# Patient Record
Sex: Male | Born: 1945 | Race: White | Hispanic: No | Marital: Married | Smoking: Former smoker
Health system: Southern US, Community
[De-identification: ages and names within clinical notes are randomized; demographics above are authoritative.]

## PROBLEM LIST (undated history)

## (undated) DIAGNOSIS — K760 Fatty (change of) liver, not elsewhere classified: Secondary | ICD-10-CM

## (undated) DIAGNOSIS — C801 Malignant (primary) neoplasm, unspecified: Secondary | ICD-10-CM

## (undated) DIAGNOSIS — I10 Essential (primary) hypertension: Secondary | ICD-10-CM

## (undated) DIAGNOSIS — J439 Emphysema, unspecified: Secondary | ICD-10-CM

## (undated) DIAGNOSIS — E119 Type 2 diabetes mellitus without complications: Secondary | ICD-10-CM

## (undated) DIAGNOSIS — I82409 Acute embolism and thrombosis of unspecified deep veins of unspecified lower extremity: Secondary | ICD-10-CM

## (undated) DIAGNOSIS — N400 Enlarged prostate without lower urinary tract symptoms: Secondary | ICD-10-CM

## (undated) DIAGNOSIS — K219 Gastro-esophageal reflux disease without esophagitis: Secondary | ICD-10-CM

## (undated) DIAGNOSIS — M1990 Unspecified osteoarthritis, unspecified site: Secondary | ICD-10-CM

## (undated) DIAGNOSIS — N200 Calculus of kidney: Secondary | ICD-10-CM

## (undated) HISTORY — PX: LUMBAR LAMINECTOMY: SHX95

## (undated) HISTORY — DX: Benign prostatic hyperplasia without lower urinary tract symptoms: N40.0

## (undated) HISTORY — DX: Calculus of kidney: N20.0

## (undated) HISTORY — PX: TONSILLECTOMY AND ADENOIDECTOMY: SUR1326

---

## 2002-11-06 HISTORY — PX: URETEROSCOPY WITH HOLMIUM LASER LITHOTRIPSY: SHX6645

## 2006-11-06 HISTORY — PX: LASER OF PROSTATE W/ GREEN LIGHT PVP: SHX1953

## 2011-06-26 ENCOUNTER — Ambulatory Visit: Payer: Self-pay | Admitting: Unknown Physician Specialty

## 2011-06-29 LAB — PATHOLOGY REPORT

## 2014-07-01 ENCOUNTER — Ambulatory Visit: Payer: Self-pay | Admitting: Unknown Physician Specialty

## 2014-07-02 LAB — PATHOLOGY REPORT

## 2015-05-26 ENCOUNTER — Encounter: Payer: Self-pay | Admitting: Urology

## 2015-05-26 ENCOUNTER — Ambulatory Visit (INDEPENDENT_AMBULATORY_CARE_PROVIDER_SITE_OTHER): Payer: Medicare PPO | Admitting: Urology

## 2015-05-26 VITALS — BP 132/91 | HR 79 | Ht 73.0 in | Wt 224.6 lb

## 2015-05-26 DIAGNOSIS — R972 Elevated prostate specific antigen [PSA]: Secondary | ICD-10-CM

## 2015-05-26 NOTE — Progress Notes (Signed)
05/26/2015 10:47 AM   Ralph Marsh 12-27-1945 [id number]  Referring provider: No referring provider defined for this encounter.  Chief Complaint  Patient presents with  . Elevated PSA   Patient is here for consult in regards to have fast tolerating PSA change over 18 months. PSA went from 60.8-4.1 and a short period of time of 18 months. Since prostate feels normal and no infection or large enlargement of any degree I recommend a biopsy device. Patient has no family history of same. 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 If by history is positive then we can check it for aggressiveness utilizing DNA studies such as myriad genetics.   PMH: Past Medical History  Diagnosis Date  . Kidney stone     Surgical History: Past Surgical History  Procedure Laterality Date  . Tonsillectomy and adenoidectomy      Home Medications:    Medication List       This list is accurate as of: 05/26/15 10:47 AM.  Always use your most recent med list.               allopurinol 300 MG tablet  Commonly known as:  ZYLOPRIM  Take 300 mg by mouth daily.     aspirin 81 MG tablet  Take 81 mg by mouth daily.     Cholecalciferol 1000 UNITS tablet  Take 1,000 Units by mouth daily.     FISH OIL + D3 PO  Take by mouth.     lisinopril 10 MG tablet  Commonly known as:  PRINIVIL,ZESTRIL  Take 10 mg by mouth daily.     MORPHINE SULFATE (CONCENTRATE) PO  Take by mouth.     multivitamin tablet  Take 1 tablet by mouth daily.     pantoprazole 40 MG tablet  Commonly known as:  PROTONIX  Take 40 mg by mouth daily.     traZODone 100 MG tablet  Commonly known as:  DESYREL  Take 100 mg by mouth at bedtime.     venlafaxine XR 150 MG 24 hr capsule  Commonly known as:  EFFEXOR-XR  Take 150 mg by mouth daily with breakfast.        Allergies:  Allergies  Allergen Reactions  . Sulfa  Antibiotics Anaphylaxis    Family History: Family History  Problem Relation Age of Onset  . Kidney disease Neg Hx   . Kidney cancer Neg Hx   . Prostate cancer Neg Hx     Social History:  reports that he has quit smoking. He does not have any smokeless tobacco history on file. He reports that he drinks about 4.2 oz of alcohol per week. He reports that he does not use illicit drugs.  ROS: UROLOGY Frequent Urination?: Yes Hard to postpone urination?: Yes Burning/pain with urination?: No Get up at night to urinate?: Yes Leakage of urine?: No Urine stream starts and stops?: No Trouble starting stream?: Yes Do you have to strain to urinate?: Yes Blood in urine?: No Urinary tract infection?: No Sexually transmitted disease?: No Injury to kidneys or bladder?: No Painful intercourse?: No Weak stream?: No Erection problems?: Yes Penile pain?: No  Gastrointestinal Nausea?: No Vomiting?: No Indigestion/heartburn?: Yes Diarrhea?: No Constipation?: No  Constitutional Fever: No Night sweats?: No Weight loss?: No Fatigue?: No  Skin Skin rash/lesions?: No Itching?: No  Eyes Blurred vision?: No Double vision?: No  Ears/Nose/Throat Sore  throat?: No Sinus problems?: No  Hematologic/Lymphatic Swollen glands?: No Easy bruising?: No  Cardiovascular Leg swelling?: No Chest pain?: No  Respiratory Cough?: No Shortness of breath?: No  Endocrine Excessive thirst?: No  Musculoskeletal Back pain?: Yes Joint pain?: Yes  Neurological Headaches?: No Dizziness?: Yes  Psychologic Depression?: Yes Anxiety?: Yes  Physical Exam: BP 132/91 mmHg  Pulse 79  Ht _0  (1.854 m)  Wt 224 lb 9.6 oz (101.878 kg)  BMI 29.64 kg/m2  Constitutional:  Alert and oriented, No acute distress. HEENT: [place] AT, moist mucus membranes.  Trachea midline, no masses. Cardiovascular: No clubbing, cyanosis, or edema. Respiratory: Normal respiratory effort, no increased work of  breathing. GI: Abdomen is soft, nontender, nondistended, no abdominal masses GU: No CVA tenderness. Small firm prostate no nodules penis and testes normal no hernias rectal sphincter normal no rectal masses Skin: No rashes, bruises or suspicious lesions. Lymph: No cervical or inguinal adenopathy. Neurologic: Grossly intact, no focal deficits, moving all 4 extremities. Psychiatric: Normal mood and affect.  Laboratory Data: No results found for: WBC, HGB, HCT, MCV, PLT  No results found for: CREATININE  No results found for: PSA  No results found for: TESTOSTERONE  No results found for: HGBA1C  Urinalysis No results found for: COLORURINE, APPEARANCEUR, LABSPEC, PHURINE, GLUCOSEU, HGBUR, BILIRUBINUR, KETONESUR, PROTEINUR, UROBILINOGEN, NITRITE, LEUKOCYTESUR  Pertinent Imaging: None   Assessment & Plan: Patient had a velocity increase in his PSA from 0.8-4.1 over the last 18 months. His PSAs have been done at the [place] in the past. I advised him because of the small size of his prostate in the firmness of the prostate to undergo biopsy. I have scheduled him for a biopsy within the next month.   There are no diagnoses linked to this encounter.  No Follow-up on file.  Collier Flowers, [facility] [address], [place], [place] [address] [phone]

## 2015-06-03 ENCOUNTER — Ambulatory Visit: Payer: Self-pay

## 2015-06-14 ENCOUNTER — Ambulatory Visit: Payer: Self-pay

## 2015-06-21 ENCOUNTER — Other Ambulatory Visit: Payer: Self-pay

## 2015-06-22 ENCOUNTER — Telehealth: Payer: Self-pay | Admitting: Urology

## 2015-06-22 ENCOUNTER — Encounter: Payer: Self-pay | Admitting: Urology

## 2015-06-22 NOTE — Telephone Encounter (Signed)
Patient's wife called stating patient had his PSA redrawn on 05-31-15 after seeing Dr. Elnoria Howard on 05-26-15 and his PSA has gone down to 1.4( See labs from [place] under media) Patient would like to cancel his biopsy scheduled for next week since his PSA has gone down. I explained that I would send a message to you to make sure that it is ok to cancel. I see in your last note a mention of a firm prostate, patient's wife states patient had prostate surgery a PVP by Dr. Quillian Quince in 2008, this was not noted by the patient at his visit but I have updated this in his chart. Could the firmness be due to this? Or should he still proceed with a prostate biopsy?

## 2015-06-28 ENCOUNTER — Ambulatory Visit: Payer: Self-pay

## 2015-06-29 ENCOUNTER — Other Ambulatory Visit: Payer: Medicare PPO | Admitting: Urology

## 2015-06-29 NOTE — Telephone Encounter (Signed)
Spoke with Dr. Elnoria Howard and he stated that patient did not need to have the biopsy done at this time but needed a 62month follow up with DRE & PSA Please schedule and notify the patient of date and time Thanks

## 2015-07-06 ENCOUNTER — Ambulatory Visit: Payer: Medicare PPO

## 2015-07-19 NOTE — Telephone Encounter (Signed)
I called and lm with the appt date and time  12-23-14 @ 9:30 with Dr. Erlene Quan since Dr. Elnoria Howard will not be here  Thanks, Sharyn Lull

## 2015-08-11 ENCOUNTER — Ambulatory Visit (INDEPENDENT_AMBULATORY_CARE_PROVIDER_SITE_OTHER): Payer: Medicare PPO | Admitting: Urology

## 2015-08-11 VITALS — BP 132/89 | HR 96 | Ht 73.0 in | Wt 225.8 lb

## 2015-08-11 DIAGNOSIS — R972 Elevated prostate specific antigen [PSA]: Secondary | ICD-10-CM | POA: Diagnosis not present

## 2015-08-11 NOTE — Progress Notes (Signed)
08/11/2015 4:32 PM   Ralph Marsh [birth year] [id number]  Referring provider: Marguerita Merles, MD [name], [place] [address]  Chief Complaint  Patient presents with  . Elevated PSA    HPI: Patient comes in with results of a RNA urine test for the possibility of cancer. Report is seen below. Patient wants a second opinion with an MRI at [facility]. I have set this up for him today.   PMH: Past Medical History  Diagnosis Date  . Kidney stone   . BPH (benign prostatic hyperplasia)     Surgical History: Past Surgical History  Procedure Laterality Date  . Tonsillectomy and adenoidectomy    . Ureteroscopy with holmium laser lithotripsy  2004  . Laser of prostate w/ green light pvp  2008    Dr. Quillian Quince    Home Medications:    Medication List       This list is accurate as of: 08/11/15  4:32 PM.  Always use your most recent med list.               allopurinol 300 MG tablet  Commonly known as:  ZYLOPRIM  Take 300 mg by mouth daily.     aspirin 81 MG tablet  Take 81 mg by mouth daily.     Cholecalciferol 1000 UNITS tablet  Take 1,000 Units by mouth daily.     FISH OIL + D3 PO  Take by mouth.     lisinopril 10 MG tablet  Commonly known as:  PRINIVIL,ZESTRIL  Take 10 mg by mouth daily.     MORPHINE SULFATE (CONCENTRATE) PO  Take by mouth.     multivitamin tablet  Take 1 tablet by mouth daily.     pantoprazole 40 MG tablet  Commonly known as:  PROTONIX  Take 40 mg by mouth daily.     traZODone 100 MG tablet  Commonly known as:  DESYREL  Take 100 mg by mouth at bedtime.     venlafaxine XR 150 MG 24 hr capsule  Commonly known as:  EFFEXOR-XR  Take 150 mg by mouth daily with breakfast.        Allergies:  Allergies  Allergen Reactions  . Sulfa Antibiotics Anaphylaxis    Family History: Family History  Problem Relation Age of Onset  . Kidney disease Neg Hx   . Kidney cancer Neg Hx   . Prostate cancer Neg Hx     Social History:   reports that he has quit smoking. He does not have any smokeless tobacco history on file. He reports that he drinks about 4.2 oz of alcohol per week. He reports that he does not use illicit drugs.  ROS: UROLOGY Frequent Urination?: Yes Hard to postpone urination?: No Burning/pain with urination?: No Get up at night to urinate?: Yes Leakage of urine?: No Urine stream starts and stops?: No Trouble starting stream?: No Do you have to strain to urinate?: No Blood in urine?: No Urinary tract infection?: No Sexually transmitted disease?: No Injury to kidneys or bladder?: No Painful intercourse?: No Weak stream?: Yes Erection problems?: No Penile pain?: No  Gastrointestinal Nausea?: No Vomiting?: No Indigestion/heartburn?: No Diarrhea?: No Constipation?: No  Constitutional Fever: No Night sweats?: No Weight loss?: No Fatigue?: Yes  Skin Skin rash/lesions?: No Itching?: No  Eyes Blurred vision?: No Double vision?: No  Ears/Nose/Throat Sore throat?: No Sinus problems?: Yes  Hematologic/Lymphatic Swollen glands?: No Easy bruising?: No  Cardiovascular Leg swelling?: No Chest pain?: No  Respiratory Cough?:  Yes Shortness of breath?: No  Endocrine Excessive thirst?: Yes  Musculoskeletal Back pain?: Yes Joint pain?: No  Neurological Headaches?: No Dizziness?: No  Psychologic Depression?: No Anxiety?: No  Physical Exam: BP 132/89 mmHg  Pulse 96  Ht 6\' 1"  (1.854 m)  Wt 225 lb 12.8 oz (102.422 kg)  BMI 29.80 kg/m2  Constitutional:  Alert and oriented, No acute distress. HEENT: [place] AT, moist mucus membranes.  Trachea midline, no masses. Cardiovascular: No clubbing, cyanosis, or edema. Respiratory: Normal respiratory effort, no increased work of breathing. GI: Abdomen is soft, nontender, nondistended, no abdominal masses GU: No CVA tenderness. Normal penis and testes. Small prostate on rectal exam firm nonnodular slightly asymmetric with the left being a  little more prominent than the right. The rectal exam reveals no rectal cancers and good sphincter tone. Skin: No rashes, bruises or suspicious lesions. Lymph: No cervical or inguinal adenopathy. Neurologic: Grossly intact, no focal deficits, moving all 4 extremities. Psychiatric: Normal mood and affect.  Laboratory Data: No results found for: WBC, HGB, HCT, MCV, PLT  No results found for: CREATININE  No results found for: PSA  No results found for: TESTOSTERONE  No results found for: HGBA1C  Urinalysis No results found for: COLORURINE, APPEARANCEUR, LABSPEC, PHURINE, GLUCOSEU, HGBUR, BILIRUBINUR, KETONESUR, PROTEINUR, UROBILINOGEN, NITRITE, LEUKOCYTESUR  Pertinent Imaging: None  Assessment & Plan:  Patient presents with the information seen below and desire for a second opinion at [facility] with an MRI guided biopsy for his prostate. I have arranged for him to get a second opinion with [facility] oncology urology and discuss with them the parameters for MRI guided biopsy presence course. On rectal exam he has a very small prostate but had a vaporization of his prostate with a pre-KPT laser vaporization process back in around 2003 I do not have the record of that particular surgery by Dr. Olena Heckle he has had no biopsies and no imaging at the present he has no voiding symptoms related to prostatic enlargement or obstruction. I have not done a cystoscopy on this patient  1. Elevated PSA Was 4.1 my original PSA several months ago which time I recommended a biopsy. Patient does second PSA which was 1.4 but in the interim with this he got a [facility] for pathology report which showed that he had a high-grade prostate cancer risk or a 54% and a prostate cancer risk or overall of 88% his PCA 3 score was 97 and in LZ his TM PVR S S2 ERG score was 2613 - PSA   No Follow-up on file.  Collier Flowers, [facility] [address], [place], [place] [address] [phone]

## 2015-08-12 LAB — PSA: PROSTATE SPECIFIC AG, SERUM: 1.8 ng/mL (ref 0.0–4.0)

## 2015-08-13 ENCOUNTER — Ambulatory Visit: Payer: Medicare PPO | Admitting: Urology

## 2015-09-07 NOTE — Telephone Encounter (Signed)
OK 

## 2015-10-27 ENCOUNTER — Encounter: Payer: Self-pay | Admitting: *Deleted

## 2015-11-02 ENCOUNTER — Encounter: Payer: Self-pay | Admitting: Family Medicine

## 2015-12-24 ENCOUNTER — Ambulatory Visit: Payer: Medicare PPO | Admitting: Urology

## 2016-02-11 ENCOUNTER — Ambulatory Visit: Payer: Medicare PPO | Admitting: Urology

## 2016-11-02 DIAGNOSIS — E119 Type 2 diabetes mellitus without complications: Secondary | ICD-10-CM

## 2016-11-02 DIAGNOSIS — E785 Hyperlipidemia, unspecified: Secondary | ICD-10-CM

## 2016-11-02 DIAGNOSIS — E1169 Type 2 diabetes mellitus with other specified complication: Secondary | ICD-10-CM

## 2016-11-24 DIAGNOSIS — I493 Ventricular premature depolarization: Secondary | ICD-10-CM | POA: Insufficient documentation

## 2017-01-20 ENCOUNTER — Emergency Department: Payer: Medicare PPO

## 2017-01-20 ENCOUNTER — Encounter: Payer: Self-pay | Admitting: *Deleted

## 2017-01-20 ENCOUNTER — Inpatient Hospital Stay
Admission: EM | Admit: 2017-01-20 | Discharge: 2017-01-21 | DRG: 694 | Disposition: A | Discharge status: Home / Self Care | Payer: Medicare PPO | Attending: Internal Medicine | Admitting: Internal Medicine

## 2017-01-20 DIAGNOSIS — Z7982 Long term (current) use of aspirin: Secondary | ICD-10-CM

## 2017-01-20 DIAGNOSIS — I10 Essential (primary) hypertension: Secondary | ICD-10-CM | POA: Diagnosis present

## 2017-01-20 DIAGNOSIS — Z87891 Personal history of nicotine dependence: Secondary | ICD-10-CM | POA: Diagnosis not present

## 2017-01-20 DIAGNOSIS — Z91040 Latex allergy status: Secondary | ICD-10-CM | POA: Diagnosis not present

## 2017-01-20 DIAGNOSIS — Z882 Allergy status to sulfonamides status: Secondary | ICD-10-CM

## 2017-01-20 DIAGNOSIS — R52 Pain, unspecified: Secondary | ICD-10-CM

## 2017-01-20 DIAGNOSIS — N201 Calculus of ureter: Secondary | ICD-10-CM

## 2017-01-20 DIAGNOSIS — N132 Hydronephrosis with renal and ureteral calculous obstruction: Secondary | ICD-10-CM | POA: Diagnosis present

## 2017-01-20 DIAGNOSIS — Z87442 Personal history of urinary calculi: Secondary | ICD-10-CM

## 2017-01-20 DIAGNOSIS — R109 Unspecified abdominal pain: Secondary | ICD-10-CM | POA: Diagnosis present

## 2017-01-20 DIAGNOSIS — R1032 Left lower quadrant pain: Secondary | ICD-10-CM

## 2017-01-20 DIAGNOSIS — Z79899 Other long term (current) drug therapy: Secondary | ICD-10-CM

## 2017-01-20 DIAGNOSIS — N400 Enlarged prostate without lower urinary tract symptoms: Secondary | ICD-10-CM | POA: Diagnosis present

## 2017-01-20 DIAGNOSIS — N200 Calculus of kidney: Secondary | ICD-10-CM

## 2017-01-20 DIAGNOSIS — Z888 Allergy status to other drugs, medicaments and biological substances status: Secondary | ICD-10-CM | POA: Diagnosis not present

## 2017-01-20 DIAGNOSIS — N139 Obstructive and reflux uropathy, unspecified: Secondary | ICD-10-CM | POA: Diagnosis present

## 2017-01-20 DIAGNOSIS — D72829 Elevated white blood cell count, unspecified: Secondary | ICD-10-CM | POA: Diagnosis present

## 2017-01-20 DIAGNOSIS — E860 Dehydration: Secondary | ICD-10-CM | POA: Diagnosis present

## 2017-01-20 DIAGNOSIS — K8020 Calculus of gallbladder without cholecystitis without obstruction: Secondary | ICD-10-CM | POA: Diagnosis present

## 2017-01-20 HISTORY — DX: Malignant (primary) neoplasm, unspecified: C80.1

## 2017-01-20 LAB — BASIC METABOLIC PANEL
ANION GAP: 10 (ref 5–15)
Anion gap: 8 (ref 5–15)
BUN: 31 mg/dL — ABNORMAL HIGH (ref 6–20)
BUN: 29 mg/dL — AB (ref 6–20)
CALCIUM: 9.3 mg/dL (ref 8.9–10.3)
CALCIUM: 8.9 mg/dL (ref 8.9–10.3)
CO2: 24 mmol/L (ref 22–32)
CO2: 27 mmol/L (ref 22–32)
CREATININE: 1.46 mg/dL — AB (ref 0.61–1.24)
Chloride: 104 mmol/L (ref 101–111)
Chloride: 104 mmol/L (ref 101–111)
Creatinine, Ser: 1.48 mg/dL — ABNORMAL HIGH (ref 0.61–1.24)
GFR calc Af Amer: 53 mL/min — ABNORMAL LOW (ref >60)
GFR calc Af Amer: 54 mL/min — ABNORMAL LOW (ref >60)
GFR calc non Af Amer: 46 mL/min — ABNORMAL LOW (ref >60)
GFR calc non Af Amer: 46 mL/min — ABNORMAL LOW (ref >60)
GLUCOSE: 178 mg/dL — AB (ref 65–99)
GLUCOSE: 197 mg/dL — AB (ref 65–99)
Potassium: 3.8 mmol/L (ref 3.5–5.1)
Potassium: 3.9 mmol/L (ref 3.5–5.1)
Sodium: 138 mmol/L (ref 135–145)
Sodium: 139 mmol/L (ref 135–145)

## 2017-01-20 LAB — URINALYSIS, COMPLETE (UACMP) WITH MICROSCOPIC
Bacteria, UA: NONE SEEN
Bilirubin Urine: NEGATIVE
Glucose, UA: NEGATIVE mg/dL
HGB URINE DIPSTICK: NEGATIVE
Ketones, ur: NEGATIVE mg/dL
LEUKOCYTES UA: NEGATIVE
NITRITE: NEGATIVE
PH: 5 (ref 5.0–8.0)
Protein, ur: NEGATIVE mg/dL
Specific Gravity, Urine: 1.027 (ref 1.005–1.030)
Squamous Epithelial / LPF: NONE SEEN

## 2017-01-20 LAB — CBC
HCT: 46.9 % (ref 40.0–52.0)
HEMATOCRIT: 48.3 % (ref 40.0–52.0)
Hemoglobin: 16.7 g/dL (ref 13.0–18.0)
Hemoglobin: 16.1 g/dL (ref 13.0–18.0)
MCH: 32.3 pg (ref 26.0–34.0)
MCH: 32.5 pg (ref 26.0–34.0)
MCHC: 34.6 g/dL (ref 32.0–36.0)
MCHC: 34.4 g/dL (ref 32.0–36.0)
MCV: 93.3 fL (ref 80.0–100.0)
MCV: 94.3 fL (ref 80.0–100.0)
PLATELETS: 162 10*3/uL (ref 150–440)
Platelets: 172 10*3/uL (ref 150–440)
RBC: 5.18 MIL/uL (ref 4.40–5.90)
RBC: 4.97 MIL/uL (ref 4.40–5.90)
RDW: 13.3 % (ref 11.5–14.5)
RDW: 13.2 % (ref 11.5–14.5)
WBC: 11.3 10*3/uL — ABNORMAL HIGH (ref 3.8–10.6)
WBC: 12.4 10*3/uL — ABNORMAL HIGH (ref 3.8–10.6)

## 2017-01-20 MED ORDER — MORPHINE SULFATE (PF) 4 MG/ML IV SOLN
4.0000 mg | Freq: Once | INTRAVENOUS | Status: AC
  Administered 2017-01-20: 4 mg via INTRAVENOUS
  Filled 2017-01-20: qty 1

## 2017-01-20 MED ORDER — HYDRALAZINE HCL 20 MG/ML IJ SOLN: 10.0000 mg | Freq: Four times a day (QID) | INTRAMUSCULAR | Status: DC | PRN

## 2017-01-20 MED ORDER — LISINOPRIL 10 MG PO TABS
10.0000 mg | ORAL_TABLET | Freq: Every day | ORAL | Status: DC
  Administered 2017-01-20 – 2017-01-21 (×2): 10 mg via ORAL
  Filled 2017-01-20 (×2): qty 1

## 2017-01-20 MED ORDER — MORPHINE SULFATE (PF) 4 MG/ML IV SOLN
4.0000 mg | INTRAVENOUS | Status: DC | PRN
  Administered 2017-01-20: 4 mg via INTRAVENOUS
  Filled 2017-01-20: qty 1

## 2017-01-20 MED ORDER — CEFTRIAXONE SODIUM-DEXTROSE 1-3.74 GM-% IV SOLR: 1.0000 g | Freq: Once | INTRAVENOUS | Status: DC

## 2017-01-20 MED ORDER — KETAMINE HCL 10 MG/ML IJ SOLN
INTRAMUSCULAR | Status: AC
  Filled 2017-01-20: qty 1

## 2017-01-20 MED ORDER — VENLAFAXINE HCL ER 75 MG PO CP24
150.0000 mg | ORAL_CAPSULE | Freq: Every day | ORAL | Status: DC
  Administered 2017-01-20 – 2017-01-21 (×2): 150 mg via ORAL
  Filled 2017-01-20 (×2): qty 2

## 2017-01-20 MED ORDER — ACETAMINOPHEN 325 MG PO TABS: 650.0000 mg | ORAL_TABLET | Freq: Four times a day (QID) | ORAL | Status: DC | PRN

## 2017-01-20 MED ORDER — KETOROLAC TROMETHAMINE 15 MG/ML IJ SOLN: 15.0000 mg | Freq: Four times a day (QID) | INTRAMUSCULAR | Status: DC | PRN

## 2017-01-20 MED ORDER — ONDANSETRON HCL 4 MG/2ML IJ SOLN: 4.0000 mg | Freq: Four times a day (QID) | INTRAMUSCULAR | Status: DC | PRN

## 2017-01-20 MED ORDER — ONDANSETRON HCL 4 MG/2ML IJ SOLN
4.0000 mg | Freq: Once | INTRAMUSCULAR | Status: AC
  Administered 2017-01-20: 4 mg via INTRAVENOUS
  Filled 2017-01-20: qty 2

## 2017-01-20 MED ORDER — DEXTROSE 5 % IV SOLN
2.0000 g | INTRAVENOUS | Status: DC
  Administered 2017-01-20 – 2017-01-21 (×2): 2 g via INTRAVENOUS
  Filled 2017-01-20 (×2): qty 2

## 2017-01-20 MED ORDER — MORPHINE SULFATE (PF) 2 MG/ML IV SOLN: 1.0000 mg | INTRAVENOUS | Status: DC | PRN

## 2017-01-20 MED ORDER — TAMSULOSIN HCL 0.4 MG PO CAPS
0.8000 mg | ORAL_CAPSULE | Freq: Every day | ORAL | Status: DC
  Administered 2017-01-20 – 2017-01-21 (×2): 0.8 mg via ORAL
  Filled 2017-01-20 (×2): qty 2

## 2017-01-20 MED ORDER — KETOROLAC TROMETHAMINE 15 MG/ML IJ SOLN: 15.0000 mg | Freq: Once | INTRAMUSCULAR | Status: DC

## 2017-01-20 MED ORDER — ACETAMINOPHEN 10 MG/ML IV SOLN
1000.0000 mg | Freq: Four times a day (QID) | INTRAVENOUS | Status: AC
  Administered 2017-01-20 – 2017-01-21 (×4): 1000 mg via INTRAVENOUS
  Filled 2017-01-20 (×4): qty 100

## 2017-01-20 MED ORDER — ACETAMINOPHEN 650 MG RE SUPP: 650.0000 mg | Freq: Four times a day (QID) | RECTAL | Status: DC | PRN

## 2017-01-20 MED ORDER — SODIUM CHLORIDE 0.9 % IV BOLUS (SEPSIS)
500.0000 mL | Freq: Once | INTRAVENOUS | Status: AC
  Administered 2017-01-20: 500 mL via INTRAVENOUS

## 2017-01-20 MED ORDER — LABETALOL HCL 5 MG/ML IV SOLN
10.0000 mg | INTRAVENOUS | Status: DC | PRN
  Administered 2017-01-20: 10 mg via INTRAVENOUS
  Filled 2017-01-20: qty 4

## 2017-01-20 MED ORDER — MORPHINE SULFATE (PF) 2 MG/ML IV SOLN: 2.0000 mg | INTRAVENOUS | Status: DC | PRN

## 2017-01-20 MED ORDER — HYDRALAZINE HCL 20 MG/ML IJ SOLN: 10.0000 mg | INTRAMUSCULAR | Status: DC | PRN

## 2017-01-20 MED ORDER — PIPERACILLIN-TAZOBACTAM 3.375 G IVPB
3.3750 g | Freq: Three times a day (TID) | INTRAVENOUS | Status: DC
  Administered 2017-01-20: 3.375 g via INTRAVENOUS
  Filled 2017-01-20: qty 50

## 2017-01-20 MED ORDER — ONDANSETRON HCL 4 MG PO TABS: 4.0000 mg | ORAL_TABLET | Freq: Four times a day (QID) | ORAL | Status: DC | PRN

## 2017-01-20 MED ORDER — PANTOPRAZOLE SODIUM 40 MG IV SOLR
40.0000 mg | INTRAVENOUS | Status: DC
  Administered 2017-01-20: 40 mg via INTRAVENOUS
  Filled 2017-01-20: qty 40

## 2017-01-20 MED ORDER — KETAMINE HCL 10 MG/ML IJ SOLN
0.1000 mg/kg | Freq: Once | INTRAMUSCULAR | Status: AC
  Administered 2017-01-20: 9.8 mg via INTRAVENOUS

## 2017-01-20 MED ORDER — KETOROLAC TROMETHAMINE 15 MG/ML IJ SOLN
15.0000 mg | Freq: Four times a day (QID) | INTRAMUSCULAR | Status: DC | PRN
  Administered 2017-01-20 (×2): 15 mg via INTRAVENOUS
  Filled 2017-01-20 (×2): qty 1

## 2017-01-20 MED ORDER — SODIUM CHLORIDE 0.9 % IV SOLN
INTRAVENOUS | Status: DC
Start: 2017-01-20 — End: 2017-01-21
  Administered 2017-01-20 – 2017-01-21 (×4): via INTRAVENOUS

## 2017-01-20 MED ORDER — LIDOCAINE 5 % EX PTCH
1.0000 | MEDICATED_PATCH | CUTANEOUS | Status: DC
  Administered 2017-01-20: 1 via TRANSDERMAL
  Filled 2017-01-20 (×2): qty 1

## 2017-01-20 MED ORDER — FENTANYL CITRATE (PF) 100 MCG/2ML IJ SOLN
100.0000 ug | INTRAMUSCULAR | Status: DC | PRN
  Administered 2017-01-20: 100 ug via INTRAVENOUS
  Filled 2017-01-20: qty 2

## 2017-01-20 MED ORDER — SODIUM CHLORIDE 0.9 % IV SOLN
Freq: Once | INTRAVENOUS | Status: AC
  Administered 2017-01-20: 04:00:00 via INTRAVENOUS

## 2017-01-20 MED ORDER — ACETAMINOPHEN 500 MG PO TABS
1000.0000 mg | ORAL_TABLET | Freq: Once | ORAL | Status: AC
  Administered 2017-01-20: 1000 mg via ORAL
  Filled 2017-01-20: qty 2

## 2017-01-20 NOTE — ED Triage Notes (Signed)
Pt presents w/ L flank pain starting last night at 2100. Pt is pale and has frequency, urgency, nausea and intense pain on L side.

## 2017-01-20 NOTE — Progress Notes (Signed)
Pharmacist - Prescriber Communication  Ceftriaxone dose modified from 1 gm to 2 gm IV Q24H for intra-abdominal infection per policy.  Neville Walston A. Maddock, [place].D., BCPS Clinical Pharmacist 01/20/2017 [phone]

## 2017-01-20 NOTE — H&P (Addendum)
[place] [place] NAME: Ralph Marsh    MR#:  [id number]  DATE OF BIRTH:  1946/02/01  DATE OF ADMISSION:  01/20/2017  PRIMARY CARE PHYSICIAN: Marguerita Merles, MD   REQUESTING/REFERRING PHYSICIAN:   CHIEF COMPLAINT:   Chief Complaint  Patient presents with  . Flank Pain    HISTORY OF PRESENT ILLNESS: Ralph Marsh  is a 71 y.o. male with a known history of Nephrolithiasis, prostate hypertrophy presented to the emergency room with left flank pain since yesterday night. The pain started in the left flank is sharp in nature 10 out of 10. Patient also has pain around the mid abdominal area. Has nausea but no evidence of vomiting. Patient also felt warm but did not have any fever. Patient was evaluated in the emergency room with CT kidney stone protocol which showed left sided obstructive uropathy with midureteral stone and bilateral nephrolithiasis. No history of any hematemesis, hemoptysis. No complaints of any hematuria. Hospitalist service was consulted for further care of the patient. Patient had a lithotripsy in the past and vaporization procedure.  PAST MEDICAL HISTORY:   Past Medical History:  Diagnosis Date  . BPH (benign prostatic hyperplasia)   . Cancer ([place])   . Kidney stone     PAST SURGICAL HISTORY: Past Surgical History:  Procedure Laterality Date  . LASER OF PROSTATE W/ GREEN LIGHT PVP  2008   Dr. Quillian Quince  . TONSILLECTOMY AND ADENOIDECTOMY    . URETEROSCOPY WITH HOLMIUM LASER LITHOTRIPSY  2004    SOCIAL HISTORY:  Social History  Substance Use Topics  . Smoking status: Former Research scientist (life sciences)  . Smokeless tobacco: Never Used  . Alcohol use 4.2 oz/week    7 Standard drinks or equivalent per week    FAMILY HISTORY:  Family History  Problem Relation Age of Onset  . Kidney disease Neg Hx   . Kidney cancer Neg Hx   . Prostate cancer Neg Hx     DRUG ALLERGIES:  Allergies  Allergen Reactions  . Other Anaphylaxis  .  Solifenacin Other (See Comments)  . Sulfa Antibiotics Anaphylaxis  . Latex Other (See Comments)    REVIEW OF SYSTEMS:   CONSTITUTIONAL: No fever, has weakness.  EYES: No blurred or double vision.  EARS, NOSE, AND THROAT: No tinnitus or ear pain.  RESPIRATORY: No cough, shortness of breath, wheezing or hemoptysis.  CARDIOVASCULAR: No chest pain, orthopnea, edema.  GASTROINTESTINAL: Has nausea,  No vomiting, diarrhea  Has abdominal pain.  Has left flank pain. GENITOURINARY: No dysuria, hematuria.  ENDOCRINE: No polyuria, nocturia,  HEMATOLOGY: No anemia, easy bruising or bleeding SKIN: No rash or lesion. MUSCULOSKELETAL: No joint pain or arthritis.   NEUROLOGIC: No tingling, numbness, weakness.  PSYCHIATRY: No anxiety or depression.   MEDICATIONS AT HOME:  Prior to Admission medications   Medication Sig Start Date End Date Taking? Authorizing Provider  allopurinol (ZYLOPRIM) 300 MG tablet Take 300 mg by mouth daily.   Yes Historical Provider, MD  aspirin 81 MG tablet Take 81 mg by mouth daily.   Yes Historical Provider, MD  Cholecalciferol 1000 UNITS tablet Take 1,000 Units by mouth daily.   Yes Historical Provider, MD  diclofenac sodium (VOLTAREN) 1 % GEL Apply 2 g topically as needed.   Yes Historical Provider, MD  Fish Oil-Cholecalciferol (FISH OIL + D3 PO) Take 1 capsule by mouth daily.    Yes Historical Provider, MD  lisinopril (PRINIVIL,ZESTRIL) 10 MG tablet Take 10 mg by mouth  daily.   Yes Historical Provider, MD  MORPHINE SULFATE, CONCENTRATE, PO Take 15 mg by mouth 2 (two) times daily as needed.    Yes Historical Provider, MD  Multiple Vitamin (MULTIVITAMIN) tablet Take 1 tablet by mouth daily.   Yes Historical Provider, MD  Nutritional Supplements (JUICE PLUS FIBRE) LIQD Take 1 Dose by mouth daily.   Yes Historical Provider, MD  pantoprazole (PROTONIX) 40 MG tablet Take 40 mg by mouth daily.   Yes Historical Provider, MD  venlafaxine XR (EFFEXOR-XR) 150 MG 24 hr capsule  Take 150 mg by mouth daily with breakfast.   Yes Historical Provider, MD  triamcinolone cream (KENALOG) 0.5 % Apply 1 application topically 2 (two) times daily as needed. 04/04/16 04/04/17  Historical Provider, MD      PHYSICAL EXAMINATION:   VITAL SIGNS: Blood pressure (!) 155/113, pulse 89, temperature 97.9 F (36.6 C), temperature source Oral, resp. rate 20, height 6\' 1"  (1.854 m), weight 98 kg (216 lb), SpO2 94 %.  GENERAL:  71 y.o.-year-old patient lying in the bed with no acute distress.  EYES: Pupils equal, round, reactive to light and accommodation. No scleral icterus. Extraocular muscles intact.  HEENT: Head atraumatic, normocephalic. Oropharynx dry and nasopharynx clear.  NECK:  Supple, no jugular venous distention. No thyroid enlargement, no tenderness.  LUNGS: Normal breath sounds bilaterally, no wheezing, rales,rhonchi or crepitation. No use of accessory muscles of respiration.  CARDIOVASCULAR: S1, S2 normal. No murmurs, rubs, or gallops.  ABDOMEN: Soft, mild tenderness around umbilicus, nondistended. Bowel sounds present. No organomegaly or mass.  Tenderness left flank area noted Guarding present left flank area EXTREMITIES: No pedal edema, cyanosis, or clubbing.  NEUROLOGIC: Cranial nerves II through XII are intact. Muscle strength 5/5 in all extremities. Sensation intact. Gait not checked.  PSYCHIATRIC: The patient is alert and oriented x 3.  SKIN: No obvious rash, lesion, or ulcer.   LABORATORY PANEL:   CBC  Recent Labs Lab 01/20/17 0124  WBC 11.3*  HGB 16.7  HCT 48.3  PLT 172  MCV 93.3  MCH 32.3  MCHC 34.6  RDW 13.3   ------------------------------------------------------------------------------------------------------------------  Chemistries   Recent Labs Lab 01/20/17 0124  NA 138  K 3.8  CL 104  CO2 24  GLUCOSE 178*  BUN 31*  CREATININE 1.48*  CALCIUM 9.3    ------------------------------------------------------------------------------------------------------------------ estimated creatinine clearance is 55.6 mL/min (A) (by C-G formula based on SCr of 1.48 mg/dL (H)). ------------------------------------------------------------------------------------------------------------------ No results for input(s): TSH, T4TOTAL, T3FREE, THYROIDAB in the last 72 hours.  Invalid input(s): FREET3   Coagulation profile No results for input(s): INR, PROTIME in the last 168 hours. ------------------------------------------------------------------------------------------------------------------- No results for input(s): DDIMER in the last 72 hours. -------------------------------------------------------------------------------------------------------------------  Cardiac Enzymes No results for input(s): CKMB, TROPONINI, MYOGLOBIN in the last 168 hours.  Invalid input(s): CK ------------------------------------------------------------------------------------------------------------------ Invalid input(s): POCBNP  ---------------------------------------------------------------------------------------------------------------  Urinalysis    Component Value Date/Time   COLORURINE YELLOW (A) 01/20/2017 0124   APPEARANCEUR CLEAR (A) 01/20/2017 0124   LABSPEC 1.027 01/20/2017 0124   PHURINE 5.0 01/20/2017 0124   GLUCOSEU NEGATIVE 01/20/2017 0124   HGBUR NEGATIVE 01/20/2017 0124   BILIRUBINUR NEGATIVE 01/20/2017 0124   KETONESUR NEGATIVE 01/20/2017 0124   PROTEINUR NEGATIVE 01/20/2017 0124   NITRITE NEGATIVE 01/20/2017 0124   LEUKOCYTESUR NEGATIVE 01/20/2017 0124     RADIOLOGY: Ct Renal Stone Study  Result Date: 01/20/2017 CLINICAL DATA:  Left flank pain EXAM: CT ABDOMEN AND PELVIS WITHOUT CONTRAST TECHNIQUE: Multidetector CT imaging of the abdomen and pelvis was performed  following the standard protocol without IV contrast. COMPARISON:  None.  FINDINGS: Lower chest: No pulmonary nodules or pleural effusion. No visible pericardial effusion. Hepatobiliary: Normal noncontrast appearance of the liver. No visible biliary dilatation. There are multiple large stones within the gallbladder, measuring up to 1.9 cm. Pancreas: Mild atrophy without focal abnormality. Spleen: Normal. Adrenal glands: Normal. Urinary Tract: --Right kidney/ureter: Nonobstructing right nephrolithiasis measures up to 7 mm. No right hydronephrosis. The right ureter is normal and unobstructed. --Left kidney/ureter: There is a 4 x 6 mm stone within the midportion of the left ureter, causing mild left hydroureteronephrosis and perinephric stranding. There are other nonobstructing renal calculi that measure up to 3 mm. --Urinary bladder: Decompressed. Stomach/Bowel: There is descending colonic and rectosigmoid diverticulosis without acute inflammation. No evidence of small-bowel obstruction. No acute inflammation. Vascular/Lymphatic: There is atherosclerotic calcification of the non aneurysmal abdominal aorta. No abdominal or pelvic lymphadenopathy. Reproductive: Normal prostate and seminal vesicles. Musculoskeletal. Multilevel lumbar facet arthrosis. No acute abnormality. No bony spinal canal stenosis. IMPRESSION: 1. Left-sided obstructive uropathy with mid ureteral calculus measuring 4 x 6 mm and resultant mild hydroureteronephrosis. 2. Bilateral nonobstructing nephrolithiasis measuring up to 7 mm on the right and 3 mm on the left. 3. Cholelithiasis and colonic diverticulosis without acute inflammation. 4. Calcific aortic atherosclerosis. Electronically Signed   By: Ulyses Jarred M.D.   On: 01/20/2017 02:07    EKG: Orders placed or performed during the hospital encounter of 01/20/17  . EKG 12-Lead  . EKG 12-Lead    IMPRESSION AND PLAN: 71 year old male patient with history of nephrolithiasis, prostate hypertrophy presented to the emergency room with left flank pain. Admitting  diagnosis 1. Left-sided obstructive uropathy with mid ureteral stone 2. Bilateral nephrolithiasis 3. Dehydration 4. Left flank pain 5. Cholelithiasis Treatment plan Admit patient to medical floor IV fluid hydration Control nausea and vomiting with IV Zofran Keep patient nothing by mouth for now Urology consultation Pain management with IV morphine Sequential compression devices to lower extremities for DVT prophylaxis Follow-up renal function Check blood and urine culture IV Zosyn antibiotic Supportive care All the records are reviewed and case discussed with ED provider. Management plans discussed with the patient, family and they are in agreement. Urology service Dr Louis Meckel informed.  CODE STATUS:FULL CODE Surrogate decision maker : wife Code Status History    This patient does not have a recorded code status. Please follow your organizational policy for patients in this situation.       TOTAL TIME TAKING CARE OF THIS PATIENT: 52 minutes.    Saundra Shelling M.D on 01/20/2017 at 4:46 AM  Between 7am to 6pm - Pager - [phone]  After 6pm go to [email or web address] - password [email or web address] [place] [facility]  Office  [phone]  CC: Primary care physician; Marguerita Merles, MD

## 2017-01-20 NOTE — ED Notes (Addendum)
Pt's O2 sats noted to be in high 80s and low 90s; pt placed on O2 via [place] at 2L Pt reports pain still at 10/10; MD notified.

## 2017-01-20 NOTE — ED Provider Notes (Signed)
[facility] Emergency Department Provider Note    First MD Initiated Contact with Patient 01/20/17 0114     (approximate)  I have reviewed the triage vital signs and the nursing notes.   HISTORY  Chief Complaint Flank Pain    HPI Ralph Marsh is a 71 y.o. male with chief complaint of sudden onset left flank pain around 9 PM tonight is been steadily worsening over the past several hours. There is associated nausea but no vomiting. He does have a history kidney stones and states "this feels like it". The pain has not had 10 in severity. Does not have pain radiating through to his testicles. Does have pain radiating to his left groin. No rashes. No fevers. Denies any burning when he urinates. Does also have a history of diverticulosis.  States this feels different from his episodes of diverticulitis.   Past Medical History:  Diagnosis Date  . BPH (benign prostatic hyperplasia)   . Cancer ([name])   . Kidney stone    Family History  Problem Relation Age of Onset  . Kidney disease Neg Hx   . Kidney cancer Neg Hx   . Prostate cancer Neg Hx    Past Surgical History:  Procedure Laterality Date  . LASER OF PROSTATE W/ GREEN LIGHT PVP  2008   Dr. Quillian Quince  . TONSILLECTOMY AND ADENOIDECTOMY    . URETEROSCOPY WITH HOLMIUM LASER LITHOTRIPSY  2004   There are no active problems to display for this patient.     Prior to Admission medications   Medication Sig Start Date End Date Taking? Authorizing Provider  allopurinol (ZYLOPRIM) 300 MG tablet Take 300 mg by mouth daily.    Historical Provider, MD  aspirin 81 MG tablet Take 81 mg by mouth daily.    Historical Provider, MD  Cholecalciferol 1000 UNITS tablet Take 1,000 Units by mouth daily.    Historical Provider, MD  Fish Oil-Cholecalciferol (FISH OIL + D3 PO) Take by mouth.    Historical Provider, MD  lisinopril (PRINIVIL,ZESTRIL) 10 MG tablet Take 10 mg by mouth daily.    Historical Provider, MD  MORPHINE  SULFATE, CONCENTRATE, PO Take by mouth.    Historical Provider, MD  Multiple Vitamin (MULTIVITAMIN) tablet Take 1 tablet by mouth daily.    Historical Provider, MD  pantoprazole (PROTONIX) 40 MG tablet Take 40 mg by mouth daily.    Historical Provider, MD  traZODone (DESYREL) 100 MG tablet Take 100 mg by mouth at bedtime.    Historical Provider, MD  venlafaxine XR (EFFEXOR-XR) 150 MG 24 hr capsule Take 150 mg by mouth daily with breakfast.    Historical Provider, MD    Allergies Sulfa antibiotics    Social History Social History  Substance Use Topics  . Smoking status: Former Research scientist (life sciences)  . Smokeless tobacco: Never Used  . Alcohol use 4.2 oz/week    7 Standard drinks or equivalent per week    Review of Systems Patient denies headaches, rhinorrhea, blurry vision, numbness, shortness of breath, chest pain, edema, cough, abdominal pain, nausea, vomiting, diarrhea, dysuria, fevers, rashes or hallucinations unless otherwise stated above in HPI. ____________________________________________   PHYSICAL EXAM:  VITAL SIGNS: Vitals:   01/20/17 0230 01/20/17 0300  BP: (!) 165/97 (!) 152/109  Pulse: 91 (!) 56  Resp: (!) 21 11  Temp:      Constitutional: Alert and oriented. Uncomfortable appearing. Eyes: Conjunctivae are normal. PERRL. EOMI. Head: Atraumatic. Nose: No congestion/rhinnorhea. Mouth/Throat: Mucous membranes are moist.  Oropharynx non-erythematous.  Neck: No stridor. Painless ROM. No cervical spine tenderness to palpation Hematological/Lymphatic/Immunilogical: No cervical lymphadenopathy. Cardiovascular: Normal rate, regular rhythm. Grossly normal heart sounds.  Good peripheral circulation. Respiratory: Normal respiratory effort.  No retractions. Lungs CTAB. Gastrointestinal: Soft with mild ttp LLQ, no rebound or guarding. No distention. No abdominal bruits. Left CVA ttp. Musculoskeletal: No lower extremity tenderness nor edema.  No joint effusions. Neurologic:  Normal speech  and language. No gross focal neurologic deficits are appreciated. No gait instability. Skin:  Skin is warm, dry and intact. No rash noted. Psychiatric: Mood and affect are normal. Speech and behavior are normal.  ____________________________________________   LABS (all labs ordered are listed, but only abnormal results are displayed)  Results for orders placed or performed during the hospital encounter of 01/20/17 (from the past 24 hour(s))  Urinalysis, Complete w Microscopic     Status: Abnormal   Collection Time: 01/20/17  1:24 AM  Result Value Ref Range   Color, Urine YELLOW (A) YELLOW   APPearance CLEAR (A) CLEAR   Specific Gravity, Urine 1.027 1.005 - 1.030   pH 5.0 5.0 - 8.0   Glucose, UA NEGATIVE NEGATIVE mg/dL   Hgb urine dipstick NEGATIVE NEGATIVE   Bilirubin Urine NEGATIVE NEGATIVE   Ketones, ur NEGATIVE NEGATIVE mg/dL   Protein, ur NEGATIVE NEGATIVE mg/dL   Nitrite NEGATIVE NEGATIVE   Leukocytes, UA NEGATIVE NEGATIVE   RBC / HPF 0-5 0 - 5 RBC/hpf   WBC, UA 0-5 0 - 5 WBC/hpf   Bacteria, UA NONE SEEN NONE SEEN   Squamous Epithelial / LPF NONE SEEN NONE SEEN   Mucous PRESENT    Sperm, UA PRESENT   CBC     Status: Abnormal   Collection Time: 01/20/17  1:24 AM  Result Value Ref Range   WBC 11.3 (H) 3.8 - 10.6 K/uL   RBC 5.18 4.40 - 5.90 MIL/uL   Hemoglobin 16.7 13.0 - 18.0 g/dL   HCT 48.3 40.0 - 52.0 %   MCV 93.3 80.0 - 100.0 fL   MCH 32.3 26.0 - 34.0 pg   MCHC 34.6 32.0 - 36.0 g/dL   RDW 13.3 11.5 - 14.5 %   Platelets 172 150 - 440 K/uL  Basic metabolic panel     Status: Abnormal   Collection Time: 01/20/17  1:24 AM  Result Value Ref Range   Sodium 138 135 - 145 mmol/L   Potassium 3.8 3.5 - 5.1 mmol/L   Chloride 104 101 - 111 mmol/L   CO2 24 22 - 32 mmol/L   Glucose, Bld 178 (H) 65 - 99 mg/dL   BUN 31 (H) 6 - 20 mg/dL   Creatinine, Ser 1.48 (H) 0.61 - 1.24 mg/dL   Calcium 9.3 8.9 - 10.3 mg/dL   GFR calc non Af Amer 46 (L) >60 mL/min   GFR calc Af Amer 53  (L) >60 mL/min   Anion gap 10 5 - 15   ____________________________________________  EKG My review and personal interpretation at Time: 0:54   Indication: flank pain  Rate: 50  Rhythm: sinus Axis: normal Other: normal intervals, no st elevations or depressions ____________________________________________  RADIOLOGY  I personally reviewed all radiographic images ordered to evaluate for the above acute complaints and reviewed radiology reports and findings.  These findings were personally discussed with the patient.  Please see medical record for radiology report.  ____________________________________________   PROCEDURES  Procedure(s) performed:  Procedures    Critical Care performed:  ____________________________________________   INITIAL IMPRESSION / ASSESSMENT  AND PLAN / ED COURSE  Pertinent labs & imaging results that were available during my care of the patient were reviewed by me and considered in my medical decision making (see chart for details).  DDX: stone, uti, pyelo, diverticulitis, shingles, msk strain, AAA  Mykael Batz is a 71 y.o. who presents to the ED with acute left flank pain with Hx of stones. No fevers, no systemic symptoms. no urinary symptoms. Denies trauma or injury. Afebrile in ED. Exam as above. Flank TTP, otherwise abdominal exam is benign. No peritoneal signs. Possible kidney stone, cystitis, or pyelonephritis.   Checking urine. UA with without evidence infection CT Stone with evidence of left ureterolithiasis with moderate hydro. Clinical picture is not consistent with appendicitis, diverticulitis, pancreatitis, cholecystitis, bowel perforation, aortic dissection, splenic injury or acute abdominal process at this time.  Patient with persistent discomfort despite multiple doses of IV pain medications.  Based on intractable pain and acute ureterolithaisis with moderate hydro.  Case discussed with hospitalist who agrees to admit patient for  further evaluation and management.  Have discussed with the patient and available family all diagnostics and treatments performed thus far and all questions were answered to the best of my ability. The patient demonstrates understanding and agreement with plan.       ____________________________________________   FINAL CLINICAL IMPRESSION(S) / ED DIAGNOSES  Final diagnoses:  Left flank pain  Ureterolithiasis  Intractable pain      NEW MEDICATIONS STARTED DURING THIS VISIT:  New Prescriptions   No medications on file     Note:  This document was prepared using Dragon voice recognition software and may include unintentional dictation errors.    Merlyn Lot, MD 01/20/17 [phone]

## 2017-01-20 NOTE — Progress Notes (Signed)
Pharmacy Antibiotic Note  Ralph Marsh is a 71 y.o. male admitted on 01/20/2017 with Pyelonephritis.  Pharmacy has been consulted for Zosyn dosing.  Plan: Zosyn 3.375g IV q8h (4 hour infusion).  Height: 6\' 1"  (185.4 cm) Weight: 216 lb (98 kg) IBW/kg (Calculated) : 79.9  Temp (24hrs), Avg:97.9 F (36.6 C), Min:97.9 F (36.6 C), Max:97.9 F (36.6 C)   Recent Labs Lab 01/20/17 0124  WBC 11.3*  CREATININE 1.48*    Estimated Creatinine Clearance: 55.6 mL/min (A) (by C-G formula based on SCr of 1.48 mg/dL (H)).    Allergies  Allergen Reactions  . Other Anaphylaxis  . Solifenacin Other (See Comments)  . Sulfa Antibiotics Anaphylaxis  . Latex Other (See Comments)     Thank you for allowing pharmacy to be a part of this patient's care.  Tobie Lords, PharmD, BCPS Clinical Pharmacist 01/20/2017

## 2017-01-20 NOTE — Consult Note (Signed)
I have been asked to see the patient by Dr. Wynn Banker, for evaluation and management of left sided obstructing stone.  History of present illness: 71 year old white male who presented to the emergency department yesterday afternoon late with acute onset left-sided flank pain. The patient had associated nausea and vomiting. He describes the pain 10/10. He denies any fevers or chills. He denies any gross hematuria or dysuria.  The patient has a history of kidney stones and was treated in 2004 with ureteroscopy by Dr. Olena Heckle. He is also passed the stone on his own in the past.  In the emergency room the patient was noted to have a slightly elevated white blood cell count to 11.3.  His urinalysis demonstrated no significant abnormality, certainly no evidence of infection. A CT scan, stone protocol, was obtained measuring a 4 mm left midureteral stone with associated hydronephrosis. After being given ketamine and several doses of morphine the patient was admitted for pain control.  Currently, the patient is resting, but still complaining of severe pain.  Review of systems: A 12 point comprehensive review of systems was obtained and is negative unless otherwise stated in the history of present illness.  Patient Active Problem List   Diagnosis Date Noted  . Obstructive uropathy 01/20/2017    No current facility-administered medications on file prior to encounter.    Current Outpatient Prescriptions on File Prior to Encounter  Medication Sig Dispense Refill  . allopurinol (ZYLOPRIM) 300 MG tablet Take 300 mg by mouth daily.    Marland Kitchen aspirin 81 MG tablet Take 81 mg by mouth daily.    . Cholecalciferol 1000 UNITS tablet Take 1,000 Units by mouth daily.    . Fish Oil-Cholecalciferol (FISH OIL + D3 PO) Take 1 capsule by mouth daily.     Marland Kitchen lisinopril (PRINIVIL,ZESTRIL) 10 MG tablet Take 10 mg by mouth daily.    . MORPHINE SULFATE, CONCENTRATE, PO Take 15 mg by mouth 2 (two) times daily as needed.      . Multiple Vitamin (MULTIVITAMIN) tablet Take 1 tablet by mouth daily.    . pantoprazole (PROTONIX) 40 MG tablet Take 40 mg by mouth daily.    Marland Kitchen venlafaxine XR (EFFEXOR-XR) 150 MG 24 hr capsule Take 150 mg by mouth daily with breakfast.      Past Medical History:  Diagnosis Date  . BPH (benign prostatic hyperplasia)   . Cancer ([place])   . Kidney stone     Past Surgical History:  Procedure Laterality Date  . LASER OF PROSTATE W/ GREEN LIGHT PVP  2008   Dr. Quillian Quince  . TONSILLECTOMY AND ADENOIDECTOMY    . URETEROSCOPY WITH HOLMIUM LASER LITHOTRIPSY  2004    Social History  Substance Use Topics  . Smoking status: Former Research scientist (life sciences)  . Smokeless tobacco: Never Used  . Alcohol use 4.2 oz/week    7 Standard drinks or equivalent per week    Family History  Problem Relation Age of Onset  . Kidney disease Neg Hx   . Kidney cancer Neg Hx   . Prostate cancer Neg Hx     PE: Vitals:   01/20/17 0632 01/20/17 0646 01/20/17 0702 01/20/17 0945  BP: (!) 146/95 (!) 148/91 (!) 143/95 139/85  Pulse: 62 70 87 84  Resp:      Temp:      TempSrc:      SpO2: 96% 95% 95%   Weight:      Height:       Patient appears to be in  no acute distress  patient is alert and oriented x3 Atraumatic normocephalic head No cervical or supraclavicular lymphadenopathy appreciated No increased work of breathing, no audible wheezes/rhonchi Regular sinus rhythm/rate Abdomen is soft, nontender, nondistended Left-sided CVA tenderness and left mid abdominal tenderness  Lower extremities are symmetric without appreciable edema Grossly neurologically intact No identifiable skin lesions   Recent Labs  01/20/17 0124 01/20/17 0601  WBC 11.3* 12.4*  HGB 16.7 16.1  HCT 48.3 46.9    Recent Labs  01/20/17 0124 01/20/17 0601  NA 138 139  K 3.8 3.9  CL 104 104  CO2 24 27  GLUCOSE 178* 197*  BUN 31* 29*  CREATININE 1.48* 1.46*  CALCIUM 9.3 8.9   No results for input(s): LABPT, INR in the last 72  hours. No results for input(s): LABURIN in the last 72 hours. No results found for this or any previous visit.  Imaging: I have independently reviewed the patient's CT scan demonstrating a 4 mm obstructing stone in the left mid ureter. He has not obstructing stones bilaterally.  Imp: 70 year old man with a history of nephrolithiasis he presented to the ER with an obstructing left ureteral stone with difficult to control pain. The patient takes morphine routinely for chronic pain and as such his pain is more difficult to manage. There is no clear evidence of infection or obstructive uropathy.  Recommendations: I recommended that the patient try medical expulsion therapy over the next 24 hours with aggressive pain control. I have started the patient on IV Tylenol as well as IV Toradol. I also have started the patient on tamsulosin 0.8 milligrams. We will plan to have the patient made nothing by mouth at midnight tonight and reassess him in the morning. Consider ureteroscopy at that point.  Thank you for involving me in this patient's care, I will continue to follow along.  Louis Meckel W

## 2017-01-20 NOTE — ED Notes (Addendum)
Pt reports pain unchanged after Morphine 4 mg administration; pt reports no nausea after Zofran 4 mg administration. MD notified.

## 2017-01-21 ENCOUNTER — Inpatient Hospital Stay: Payer: Medicare PPO

## 2017-01-21 DIAGNOSIS — R1032 Left lower quadrant pain: Secondary | ICD-10-CM

## 2017-01-21 DIAGNOSIS — N201 Calculus of ureter: Secondary | ICD-10-CM

## 2017-01-21 DIAGNOSIS — D72829 Elevated white blood cell count, unspecified: Secondary | ICD-10-CM

## 2017-01-21 LAB — URINE CULTURE: CULTURE: NO GROWTH

## 2017-01-21 MED ORDER — OXYCODONE HCL 15 MG PO TABS: 15.0000 mg | ORAL_TABLET | ORAL | 0 refills | Status: DC | PRN

## 2017-01-21 MED ORDER — TAMSULOSIN HCL 0.4 MG PO CAPS: 0.4000 mg | ORAL_CAPSULE | Freq: Every day | ORAL | 0 refills | Status: DC

## 2017-01-21 NOTE — Progress Notes (Signed)
Urology Inpatient Progress Report  Ureterolithiasis [N20.1] Left flank pain [R10.9] Intractable pain [R52]   Intv/Subj: Yesterday, the patient had significant improvement in his pain with the IV Tylenol. For most of the day and then most of the night. This morning, he describes minimal left-sided pressure. No additional complaints this morning.  Active Problems:   Obstructive uropathy  Current Facility-Administered Medications  Medication Dose Route Frequency Provider Last Rate Last Dose  . 0.9 %  sodium chloride infusion   Intravenous Continuous Saundra Shelling, MD 125 mL/hr at 01/21/17 0218    . cefTRIAXone (ROCEPHIN) 2 g in dextrose 5 % 50 mL IVPB  2 g Intravenous Q24H Hillary Bow, MD   2 g at 01/20/17 1000  . hydrALAZINE (APRESOLINE) injection 10 mg  10 mg Intravenous Q6H PRN Srikar Sudini, MD      . ketorolac (TORADOL) 15 MG/ML injection 15 mg  15 mg Intravenous Q6H PRN Ardis Hughs, MD   15 mg at 01/20/17 2132  . labetalol (NORMODYNE,TRANDATE) injection 10 mg  10 mg Intravenous Q4H PRN Saundra Shelling, MD   10 mg at 01/20/17 0602  . lidocaine (LIDODERM) 5 % 1 patch  1 patch Transdermal Q24H Merlyn Lot, MD   1 patch at 01/20/17 [phone]  . lisinopril (PRINIVIL,ZESTRIL) tablet 10 mg  10 mg Oral Daily Saundra Shelling, MD   10 mg at 01/20/17 0950  . morphine 2 MG/ML injection 1 mg  1 mg Intravenous Q2H PRN Srikar Sudini, MD      . ondansetron (ZOFRAN) tablet 4 mg  4 mg Oral Q6H PRN Saundra Shelling, MD       Or  . ondansetron (ZOFRAN) injection 4 mg  4 mg Intravenous Q6H PRN Pavan Pyreddy, MD      . pantoprazole (PROTONIX) injection 40 mg  40 mg Intravenous Q24H Ardis Hughs, MD   40 mg at 01/20/17 1215  . tamsulosin (FLOMAX) capsule 0.8 mg  0.8 mg Oral Daily Ardis Hughs, MD   0.8 mg at 01/20/17 1215  . venlafaxine XR (EFFEXOR-XR) 24 hr capsule 150 mg  150 mg Oral Q breakfast Reatha Harps Pyreddy, MD   150 mg at 01/20/17 0950     Objective: Vital: Vitals:   01/20/17 0945  01/20/17 1321 01/20/17 2059 01/21/17 0508  BP: 139/85 126/75 106/67 116/70  Pulse: 84 81 95 86  Resp:  16 18 18   Temp:  98.1 F (36.7 C) 97.8 F (36.6 C) 97.5 F (36.4 C)  TempSrc:  Oral Oral Oral  SpO2:  96% 94% 97%  Weight:      Height:       I/Os: I/O last 3 completed shifts: In: 3861.2 [P.O.:240; I.V.:3021.2; IV Piggyback:600] Out: 1478 [Urine:1025]  Physical Exam:  General: Patient is in no apparent distress Lungs: Normal respiratory effort, chest expands symmetrically. GI: IThe abdomen is soft and nontender without mass. Ext: lower extremities symmetric  Lab Results:  Recent Labs  01/20/17 0124 01/20/17 0601  WBC 11.3* 12.4*  HGB 16.7 16.1  HCT 48.3 46.9    Recent Labs  01/20/17 0124 01/20/17 0601  NA 138 139  K 3.8 3.9  CL 104 104  CO2 24 27  GLUCOSE 178* 197*  BUN 31* 29*  CREATININE 1.48* 1.46*  CALCIUM 9.3 8.9   No results for input(s): LABPT, INR in the last 72 hours. No results for input(s): LABURIN in the last 72 hours. No results found for this or any previous visit.  Studies/Results: Ct [facility] [employer]  Result Date: 01/20/2017 CLINICAL DATA:  Left flank pain EXAM: CT ABDOMEN AND PELVIS WITHOUT CONTRAST TECHNIQUE: Multidetector CT imaging of the abdomen and pelvis was performed following the standard protocol without IV contrast. COMPARISON:  None. FINDINGS: Lower chest: No pulmonary nodules or pleural effusion. No visible pericardial effusion. Hepatobiliary: Normal noncontrast appearance of the liver. No visible biliary dilatation. There are multiple large stones within the gallbladder, measuring up to 1.9 cm. Pancreas: Mild atrophy without focal abnormality. Spleen: Normal. Adrenal glands: Normal. Urinary Tract: --Right kidney/ureter: Nonobstructing right nephrolithiasis measures up to 7 mm. No right hydronephrosis. The right ureter is normal and unobstructed. --Left kidney/ureter: There is a 4 x 6 mm stone within the midportion of the left  ureter, causing mild left hydroureteronephrosis and perinephric stranding. There are other nonobstructing renal calculi that measure up to 3 mm. --Urinary bladder: Decompressed. Stomach/Bowel: There is descending colonic and rectosigmoid diverticulosis without acute inflammation. No evidence of small-bowel obstruction. No acute inflammation. Vascular/Lymphatic: There is atherosclerotic calcification of the non aneurysmal abdominal aorta. No abdominal or pelvic lymphadenopathy. Reproductive: Normal prostate and seminal vesicles. Musculoskeletal. Multilevel lumbar facet arthrosis. No acute abnormality. No bony spinal canal stenosis. IMPRESSION: 1. Left-sided obstructive uropathy with mid ureteral calculus measuring 4 x 6 mm and resultant mild hydroureteronephrosis. 2. Bilateral nonobstructing nephrolithiasis measuring up to 7 mm on the right and 3 mm on the left. 3. Cholelithiasis and colonic diverticulosis without acute inflammation. 4. Calcific aortic atherosclerosis. Electronically Signed   By: Ulyses Jarred M.D.   On: 01/20/2017 02:07    Assessment: 71 year old white male with a left ureteral stone currently on medical expulsion therapy,    doing well. His pain is now well controlled. He is afebrile with stable vital signs.  Plan:   I have given permission for the patient to eat this morning.  KUB is pending for this morning to allow [place] to document movement of the stone.  However, the patient has much better pain control this morning, and as such has opted to continue with medical expulsion therapy at home.  The patient does have a tolerance to narcotics, and as such will require higher doses of oxycodone than average. I would recommend that he be discharged on 15-20 mg oxycodone every 4 hours when necessary.  The patient should also be discharged home on Flomax 0.4 milligrams (I gave him 0.8 yesterday as a loading dose).  Patient should also be on an aggressive bowel regimen with such high doses  of narcotics.  The patient would like to proceed with shockwave lithotripsy this Thursday. I will get this arranged for the patient. He will be contacted by our office in [place] on Monday morning.   Louis Meckel, MD Urology 01/21/2017, 7:16 AM

## 2017-01-21 NOTE — Discharge Instructions (Signed)
Resume diet and activity as before  Please return if pain cannot be controlled with pain meds or red urine with blood or fever.

## 2017-01-23 NOTE — Discharge Summary (Signed)
[place] [facility] [place] NAME: Ralph Marsh    MR#:  [id number]  DATE OF BIRTH:  08/20/1946  DATE OF ADMISSION:  01/20/2017 ADMITTING PHYSICIAN: Saundra Shelling, MD  DATE OF DISCHARGE: 01/21/2017  2:01 PM  PRIMARY CARE PHYSICIAN: Marguerita Merles, MD   ADMISSION DIAGNOSIS:  Ureterolithiasis [N20.1] Left flank pain [R10.9] Intractable pain [R52]  DISCHARGE DIAGNOSIS:  Active Problems:   Obstructive uropathy   SECONDARY DIAGNOSIS:   Past Medical History:  Diagnosis Date  . BPH (benign prostatic hyperplasia)   . Cancer ([place])   . Kidney stone      ADMITTING HISTORY  HISTORY OF PRESENT ILLNESS: Ralph Marsh  is a 71 y.o. male with a known history of Nephrolithiasis, prostate hypertrophy presented to the emergency room with left flank pain since yesterday night. The pain started in the left flank is sharp in nature 10 out of 10. Patient also has pain around the mid abdominal area. Has nausea but no evidence of vomiting. Patient also felt warm but did not have any fever. Patient was evaluated in the emergency room with CT kidney stone protocol which showed left sided obstructive uropathy with midureteral stone and bilateral nephrolithiasis. No history of any hematemesis, hemoptysis. No complaints of any hematuria. Hospitalist service was consulted for further care of the patient. Patient had a lithotripsy in the past and vaporization procedure.  HOSPITAL COURSE:   * Obstructing left ureteral stone * Intractable pain * Hypertension  Patient was admitted to the hospitalist service for pain control. He was seen by urology who did not recommend any immediate procedures. He was started on IV Toradol and Tylenol for pain control in the hospital. With this he improved well. Repeat abdominal scan ordered by urology showed no change in position of the ureteral stone. No UTI. He did receive IV antibiotics initially to cover for any infection. No fever. Dr.  Louis Meckel of urology saw the patient and advised discharging patient home to follow-up with them for further management on Thursday. Patient was given prescription for pain medications at discharge. Advised to drink plenty of fluids. Return to emergency room if there is any hematuria, fever or worsening pain.  Stable for discharge home.  CONSULTS OBTAINED:  Treatment Team:  Saundra Shelling, MD Ardis Hughs, MD  DRUG ALLERGIES:   Allergies  Allergen Reactions  . Other Anaphylaxis  . Solifenacin Other (See Comments)  . Sulfa Antibiotics Anaphylaxis  . Latex Other (See Comments)    DISCHARGE MEDICATIONS:   Discharge Medication List as of 01/21/2017 10:55 AM    START taking these medications   Details  oxyCODONE (ROXICODONE) 15 MG immediate release tablet Take 1 tablet (15 mg total) by mouth every 4 (four) hours as needed for severe pain., Starting Sun 01/21/2017, Print    tamsulosin (FLOMAX) 0.4 MG CAPS capsule Take 1 capsule (0.4 mg total) by mouth daily after supper., Starting Sun 01/21/2017, Print      CONTINUE these medications which have NOT CHANGED   Details  allopurinol (ZYLOPRIM) 300 MG tablet Take 300 mg by mouth daily., Historical Med    aspirin 81 MG tablet Take 81 mg by mouth daily., Historical Med    Cholecalciferol 1000 UNITS tablet Take 1,000 Units by mouth daily., Historical Med    diclofenac sodium (VOLTAREN) 1 % GEL Apply 2 g topically as needed., Historical Med    Fish Oil-Cholecalciferol (FISH OIL + D3 PO) Take 1 capsule by mouth daily. , Historical Med  lisinopril (PRINIVIL,ZESTRIL) 10 MG tablet Take 10 mg by mouth daily., Historical Med    MORPHINE SULFATE, CONCENTRATE, PO Take 15 mg by mouth 2 (two) times daily as needed. , Historical Med    Multiple Vitamin (MULTIVITAMIN) tablet Take 1 tablet by mouth daily., Historical Med    Nutritional Supplements (JUICE PLUS FIBRE) LIQD Take 1 Dose by mouth daily., Historical Med    pantoprazole (PROTONIX)  40 MG tablet Take 40 mg by mouth daily., Historical Med    venlafaxine XR (EFFEXOR-XR) 150 MG 24 hr capsule Take 150 mg by mouth daily with breakfast., Historical Med    triamcinolone cream (KENALOG) 0.5 % Apply 1 application topically 2 (two) times daily as needed., Starting Tue 04/04/2016, Until Wed 04/04/2017, Historical Med        Today   VITAL SIGNS:  Blood pressure 111/77, pulse 94, temperature 97.5 F (36.4 C), temperature source Oral, resp. rate 18, height 6\' 1"  (1.854 m), weight 98 kg (216 lb), SpO2 97 %.  I/O:  No intake or output data in the 24 hours ending 01/23/17 1055  PHYSICAL EXAMINATION:  Physical Exam  GENERAL:  71 y.o.-year-old patient lying in the bed with no acute distress.  LUNGS: Normal breath sounds bilaterally, no wheezing, rales,rhonchi or crepitation. No use of accessory muscles of respiration.  CARDIOVASCULAR: S1, S2 normal. No murmurs, rubs, or gallops.  ABDOMEN: Soft, non-tender, non-distended. Bowel sounds present. No organomegaly or mass.  NEUROLOGIC: Moves all 4 extremities. PSYCHIATRIC: The patient is alert and oriented x 3.  SKIN: No obvious rash, lesion, or ulcer.   DATA REVIEW:   CBC  Recent Labs Lab 01/20/17 0601  WBC 12.4*  HGB 16.1  HCT 46.9  PLT 162    Chemistries   Recent Labs Lab 01/20/17 0601  NA 139  K 3.9  CL 104  CO2 27  GLUCOSE 197*  BUN 29*  CREATININE 1.46*  CALCIUM 8.9    Cardiac Enzymes No results for input(s): TROPONINI in the last 168 hours.  Microbiology Results  Results for orders placed or performed during the hospital encounter of 01/20/17  Urine culture     Status: None   Collection Time: 01/20/17  1:24 AM  Result Value Ref Range Status   Specimen Description URINE, CLEAN CATCH  Final   Special Requests NONE  Final   Culture   Final    NO GROWTH Performed at [facility], [place] [address], [place], [place] [address]    Report Status 01/21/2017 FINAL  Final  CULTURE, BLOOD (ROUTINE X 2)  w Reflex to ID Panel     Status: None (Preliminary result)   Collection Time: 01/20/17  5:56 AM  Result Value Ref Range Status   Specimen Description BLOOD LEFT ASSIST CONTROL  Final   Special Requests BOTTLES DRAWN AEROBIC AND ANAEROBIC BCAV  Final   Culture NO GROWTH 3 DAYS  Final   Report Status PENDING  Incomplete  CULTURE, BLOOD (ROUTINE X 2) w Reflex to ID Panel     Status: None (Preliminary result)   Collection Time: 01/20/17  6:02 AM  Result Value Ref Range Status   Specimen Description BLOOD RIGHT ASSIST CONTROL  Final   Special Requests BOTTLES DRAWN AEROBIC AND ANAEROBIC [place]  Final   Culture NO GROWTH 3 DAYS  Final   Report Status PENDING  Incomplete    RADIOLOGY:  No results found.  Follow up with PCP in 1 week.  Management plans discussed with the patient, family  and they are in agreement.  CODE STATUS:  Code Status History    Date Active Date Inactive Code Status Order ID Comments User Context   01/20/2017  5:41 AM 01/21/2017  5:06 PM Full Code [id number]  Saundra Shelling, MD Inpatient    Advance Directive Documentation     Most Recent Value  Type of Advance Directive  Healthcare Power of Attorney  Pre-existing out of facility DNR order (yellow form or pink MOST form)  -  "MOST" Form in Place?  -      TOTAL TIME TAKING CARE OF THIS PATIENT ON DAY OF DISCHARGE: more than 30 minutes.   Hillary Bow R M.D on 01/23/2017 at 10:55 AM  Between 7am to 6pm - Pager - [phone]  After 6pm go to [email or web address] - password [email or web address] [facility]  Office  [phone]  CC: Primary care physician; Marguerita Merles, MD  Note: This dictation was prepared with Dragon dictation along with smaller phrase technology. Any transcriptional errors that result from this process are unintentional.

## 2017-01-25 ENCOUNTER — Ambulatory Visit: Payer: Medicare PPO

## 2017-01-25 ENCOUNTER — Ambulatory Visit
Admission: RE | Admit: 2017-01-25 | Discharge: 2017-01-25 | Disposition: A | Discharge status: Home / Self Care | Payer: Medicare PPO | Source: Ambulatory Visit | Attending: Urology | Admitting: Urology

## 2017-01-25 ENCOUNTER — Encounter
Admission: RE | Disposition: A | Discharge status: Home / Self Care | Payer: Self-pay | Source: Ambulatory Visit | Attending: Urology

## 2017-01-25 DIAGNOSIS — Z7982 Long term (current) use of aspirin: Secondary | ICD-10-CM | POA: Insufficient documentation

## 2017-01-25 DIAGNOSIS — Z87442 Personal history of urinary calculi: Secondary | ICD-10-CM | POA: Insufficient documentation

## 2017-01-25 DIAGNOSIS — Z79899 Other long term (current) drug therapy: Secondary | ICD-10-CM | POA: Insufficient documentation

## 2017-01-25 DIAGNOSIS — Z888 Allergy status to other drugs, medicaments and biological substances status: Secondary | ICD-10-CM | POA: Diagnosis not present

## 2017-01-25 DIAGNOSIS — N200 Calculus of kidney: Secondary | ICD-10-CM | POA: Diagnosis not present

## 2017-01-25 DIAGNOSIS — N400 Enlarged prostate without lower urinary tract symptoms: Secondary | ICD-10-CM | POA: Diagnosis not present

## 2017-01-25 DIAGNOSIS — E119 Type 2 diabetes mellitus without complications: Secondary | ICD-10-CM | POA: Insufficient documentation

## 2017-01-25 DIAGNOSIS — Z91040 Latex allergy status: Secondary | ICD-10-CM | POA: Diagnosis not present

## 2017-01-25 DIAGNOSIS — Z7901 Long term (current) use of anticoagulants: Secondary | ICD-10-CM | POA: Insufficient documentation

## 2017-01-25 DIAGNOSIS — N139 Obstructive and reflux uropathy, unspecified: Secondary | ICD-10-CM

## 2017-01-25 DIAGNOSIS — Z882 Allergy status to sulfonamides status: Secondary | ICD-10-CM | POA: Insufficient documentation

## 2017-01-25 DIAGNOSIS — I10 Essential (primary) hypertension: Secondary | ICD-10-CM | POA: Diagnosis not present

## 2017-01-25 DIAGNOSIS — Z791 Long term (current) use of non-steroidal anti-inflammatories (NSAID): Secondary | ICD-10-CM | POA: Diagnosis not present

## 2017-01-25 HISTORY — PX: EXTRACORPOREAL SHOCK WAVE LITHOTRIPSY: SHX1557

## 2017-01-25 LAB — CULTURE, BLOOD (ROUTINE X 2)
CULTURE: NO GROWTH
CULTURE: NO GROWTH

## 2017-01-25 LAB — GLUCOSE, CAPILLARY: GLUCOSE-CAPILLARY: 140 mg/dL — AB (ref 65–99)

## 2017-01-25 SURGERY — LITHOTRIPSY, ESWL
Anesthesia: Moderate Sedation | Laterality: Left

## 2017-01-25 MED ORDER — DIPHENHYDRAMINE HCL 25 MG PO CAPS
25.0000 mg | ORAL_CAPSULE | Freq: Once | ORAL | Status: AC
  Administered 2017-01-25: 25 mg via ORAL

## 2017-01-25 MED ORDER — DIAZEPAM 5 MG PO TABS
ORAL_TABLET | ORAL | Status: AC
  Filled 2017-01-25: qty 2

## 2017-01-25 MED ORDER — DIPHENHYDRAMINE HCL 25 MG PO CAPS
ORAL_CAPSULE | ORAL | Status: AC
  Filled 2017-01-25: qty 1

## 2017-01-25 MED ORDER — DEXTROSE-NACL 5-0.45 % IV SOLN
INTRAVENOUS | Status: DC
  Administered 2017-01-25: 13:00:00 via INTRAVENOUS

## 2017-01-25 MED ORDER — CIPROFLOXACIN HCL 500 MG PO TABS
500.0000 mg | ORAL_TABLET | ORAL | Status: AC
  Administered 2017-01-25: 500 mg via ORAL

## 2017-01-25 MED ORDER — DIAZEPAM 5 MG PO TABS
10.0000 mg | ORAL_TABLET | Freq: Once | ORAL | Status: AC
  Administered 2017-01-25: 10 mg via ORAL

## 2017-01-25 MED ORDER — CIPROFLOXACIN HCL 500 MG PO TABS
ORAL_TABLET | ORAL | Status: AC
  Filled 2017-01-25: qty 1

## 2017-01-25 NOTE — Discharge Instructions (Signed)
Follow Dr. Carlynn Purl ESL discharge instruction sheet as reviewed.  AMBULATORY SURGERY  DISCHARGE INSTRUCTIONS   1) The drugs that you were given will stay in your system until tomorrow so for the next 24 hours you should not:  A) Drive an automobile B) Make any legal decisions C) Drink any alcoholic beverage   2) You may resume regular meals tomorrow.  Today it is better to start with liquids and gradually work up to solid foods.  You may eat anything you prefer, but it is better to start with liquids, then soup and crackers, and gradually work up to solid foods.   3) Please notify your doctor immediately if you have any unusual bleeding, trouble breathing, redness and pain at the surgery site, drainage, fever, or pain not relieved by medication.    4) Additional Instructions:        Please contact your physician with any problems or Same Day Surgery at [phone], Monday through Friday 6 am to 4 pm, or [place] at [facility] number at [phone].

## 2017-01-26 ENCOUNTER — Encounter: Payer: Self-pay | Admitting: Urology

## 2017-01-26 ENCOUNTER — Telehealth: Payer: Self-pay | Admitting: Urology

## 2017-01-26 DIAGNOSIS — N200 Calculus of kidney: Secondary | ICD-10-CM

## 2017-01-26 NOTE — Telephone Encounter (Signed)
Patient needs an order for a KUB prior to his appt in April per dr. Pilar Jarvis  Thanks  Sharyn Lull

## 2017-01-26 NOTE — Telephone Encounter (Signed)
Orders placed.

## 2017-02-01 DIAGNOSIS — N200 Calculus of kidney: Secondary | ICD-10-CM | POA: Insufficient documentation

## 2017-02-07 ENCOUNTER — Ambulatory Visit
Admission: RE | Admit: 2017-02-07 | Discharge: 2017-02-07 | Disposition: A | Discharge status: Home / Self Care | Payer: Medicare PPO | Source: Ambulatory Visit | Attending: Urology | Admitting: Urology

## 2017-02-07 ENCOUNTER — Ambulatory Visit: Payer: Medicare PPO | Admitting: Urology

## 2017-02-07 ENCOUNTER — Encounter: Payer: Self-pay | Admitting: Urology

## 2017-02-07 VITALS — BP 114/73 | HR 94 | Ht 73.0 in | Wt 209.9 lb

## 2017-02-07 DIAGNOSIS — N200 Calculus of kidney: Secondary | ICD-10-CM | POA: Insufficient documentation

## 2017-02-07 DIAGNOSIS — K8020 Calculus of gallbladder without cholecystitis without obstruction: Secondary | ICD-10-CM | POA: Insufficient documentation

## 2017-02-07 NOTE — Progress Notes (Signed)
02/07/2017 1:17 PM   Ralph Marsh Jul 18, 1946 [id number]  Referring provider: Marguerita Merles, MD [address] [place], [place] [address]  Chief Complaint  Patient presents with  . Nephrolithiasis    KUB results    HPI: The patient is a 71 year old gentleman presents today for follow-up after undergoing lithotripsy for a 4 mm left midureteral stone. He is currently on medical expulsive therapy. He also has bilateral nonobstructing renal stones. The largest is a right renal 6 mm stone. Repeat KUB today shows resolution of left ureteral stone.  He is doing well. His pain has resolved. He was unable to catch any fragments of stone. He is unsure of what stone type he has.  The patient has a history of kidney stones and was treated in 2004 with ureteroscopy by Dr. Olena Heckle. He is also passed the stone on his own in the past.     PMH: Past Medical History:  Diagnosis Date  . BPH (benign prostatic hyperplasia)   . Cancer ([place])   . Kidney stone     Surgical History: Past Surgical History:  Procedure Laterality Date  . EXTRACORPOREAL SHOCK WAVE LITHOTRIPSY Left 01/25/2017   Procedure: EXTRACORPOREAL SHOCK WAVE LITHOTRIPSY (ESWL);  Surgeon: Nickie Retort, MD;  Location: [facility] ORS;  Service: Urology;  Laterality: Left;  . LASER OF PROSTATE W/ GREEN LIGHT PVP  2008   Dr. Quillian Quince  . TONSILLECTOMY AND ADENOIDECTOMY    . URETEROSCOPY WITH HOLMIUM LASER LITHOTRIPSY  2004    Home Medications:  Allergies as of 02/07/2017      Reactions   Latex Other (See Comments)   Other Anaphylaxis   Solifenacin Other (See Comments)   Sulfa Antibiotics Anaphylaxis      Medication List       Accurate as of 02/07/17  1:17 PM. Always use your most recent med list.          allopurinol 300 MG tablet Commonly known as:  ZYLOPRIM Take 300 mg by mouth daily.   aspirin 81 MG tablet Take 81 mg by mouth daily.   Cholecalciferol 1000 units tablet Take 1,000 Units by mouth daily.   diclofenac  sodium 1 % Gel Commonly known as:  VOLTAREN Apply 2 g topically as needed.   FISH OIL + D3 PO Take 1 capsule by mouth daily.   JUICE PLUS FIBRE Liqd Take 1 Dose by mouth daily.   lisinopril 10 MG tablet Commonly known as:  PRINIVIL,ZESTRIL Take 10 mg by mouth daily.   morphine 15 MG 12 hr tablet Commonly known as:  MS CONTIN Take 15 mg by mouth every 12 (twelve) hours.   multivitamin tablet Take 1 tablet by mouth daily.   pantoprazole 40 MG tablet Commonly known as:  PROTONIX Take 40 mg by mouth daily.   tamsulosin 0.4 MG Caps capsule Commonly known as:  FLOMAX Take 1 capsule (0.4 mg total) by mouth daily after supper.   triamcinolone cream 0.5 % Commonly known as:  KENALOG Apply 1 application topically 2 (two) times daily as needed.   venlafaxine XR 150 MG 24 hr capsule Commonly known as:  EFFEXOR-XR Take 150 mg by mouth daily with breakfast.       Allergies:  Allergies  Allergen Reactions  . Latex Other (See Comments)  . Other Anaphylaxis  . Solifenacin Other (See Comments)  . Sulfa Antibiotics Anaphylaxis    Family History: Family History  Problem Relation Age of Onset  . Kidney disease Neg Hx   .  Kidney cancer Neg Hx   . Prostate cancer Neg Hx     Social History:  reports that he has quit smoking. He has never used smokeless tobacco. He reports that he drinks about 4.2 oz of alcohol per week . He reports that he does not use drugs.  ROS: UROLOGY Frequent Urination?: Yes Hard to postpone urination?: No Burning/pain with urination?: No Get up at night to urinate?: Yes Leakage of urine?: No Urine stream starts and stops?: No Trouble starting stream?: No Do you have to strain to urinate?: No Blood in urine?: No Urinary tract infection?: No Sexually transmitted disease?: No Injury to kidneys or bladder?: No Painful intercourse?: No Weak stream?: No Erection problems?: No Penile pain?: No  Gastrointestinal Nausea?: No Vomiting?:  No Indigestion/heartburn?: No Diarrhea?: Yes Constipation?: No  Constitutional Fever: No Night sweats?: No Weight loss?: No Fatigue?: Yes  Skin Skin rash/lesions?: No Itching?: No  Eyes Blurred vision?: No Double vision?: No  Ears/Nose/Throat Sore throat?: No Sinus problems?: No  Hematologic/Lymphatic Swollen glands?: No Easy bruising?: No  Cardiovascular Leg swelling?: No Chest pain?: No  Respiratory Cough?: No Shortness of breath?: No  Endocrine Excessive thirst?: No  Musculoskeletal Back pain?: Yes Joint pain?: Yes  Neurological Headaches?: No Dizziness?: No  Psychologic Depression?: No Anxiety?: No  Physical Exam: BP 114/73 (BP Location: Left Arm, Patient Position: Sitting, Cuff Size: Normal)   Pulse 94   Ht 6\' 1"  (1.854 m)   Wt 209 lb 14.4 oz (95.2 kg)   BMI 27.69 kg/m   Constitutional:  Alert and oriented, No acute distress. HEENT: [place] AT, moist mucus membranes.  Trachea midline, no masses. Cardiovascular: No clubbing, cyanosis, or edema. Respiratory: Normal respiratory effort, no increased work of breathing. GI: Abdomen is soft, nontender, nondistended, no abdominal masses GU: No CVA tenderness.  Skin: No rashes, bruises or suspicious lesions. Lymph: No cervical or inguinal adenopathy. Neurologic: Grossly intact, no focal deficits, moving all 4 extremities. Psychiatric: Normal mood and affect.  Laboratory Data: Lab Results  Component Value Date   WBC 12.4 (H) 01/20/2017   HGB 16.1 01/20/2017   HCT 46.9 01/20/2017   MCV 94.3 01/20/2017   PLT 162 01/20/2017    Lab Results  Component Value Date   CREATININE 1.46 (H) 01/20/2017    No results found for: PSA  No results found for: TESTOSTERONE  No results found for: HGBA1C  Urinalysis    Component Value Date/Time   COLORURINE YELLOW (A) 01/20/2017 0124   APPEARANCEUR CLEAR (A) 01/20/2017 0124   LABSPEC 1.027 01/20/2017 0124   PHURINE 5.0 01/20/2017 0124   GLUCOSEU  NEGATIVE 01/20/2017 0124   HGBUR NEGATIVE 01/20/2017 0124   BILIRUBINUR NEGATIVE 01/20/2017 0124   KETONESUR NEGATIVE 01/20/2017 0124   PROTEINUR NEGATIVE 01/20/2017 0124   NITRITE NEGATIVE 01/20/2017 0124   LEUKOCYTESUR NEGATIVE 01/20/2017 0124    Pertinent Imaging: Reviewed as above  Assessment & Plan:    1. Left ureteral stone Resolve status post lithotripsy  2. Bilateral renal stones nonobstructing Will obtain KUB in 6 months to monitor size  There are no diagnoses linked to this encounter.  No Follow-up on file.  Nickie Retort, MD  [facility] [address], [place], [place] [address] [phone]

## 2017-02-14 ENCOUNTER — Telehealth: Payer: Self-pay

## 2017-02-14 NOTE — Telephone Encounter (Signed)
Pt brought a denial letter from insurance company in reference to pt over night stay for kidney stone. Spoke with pt wife and made aware would need to call MR or pt billing at the main hospital to get help with this. Reinforced with wife even though a urologist admitted pt for pain control [facility] does not have the appropriate channels to help resubmit the information. Provided wife with [facility] main hospital number. Wife voiced understanding.

## 2017-05-12 ENCOUNTER — Emergency Department: Payer: Medicare PPO

## 2017-05-12 ENCOUNTER — Observation Stay
Admission: EM | Admit: 2017-05-12 | Discharge: 2017-05-13 | Disposition: A | Discharge status: Home / Self Care | Payer: Medicare PPO | Attending: Specialist | Admitting: Specialist

## 2017-05-12 DIAGNOSIS — I82513 Chronic embolism and thrombosis of femoral vein, bilateral: Secondary | ICD-10-CM | POA: Diagnosis present

## 2017-05-12 DIAGNOSIS — I82412 Acute embolism and thrombosis of left femoral vein: Principal | ICD-10-CM | POA: Insufficient documentation

## 2017-05-12 DIAGNOSIS — Z87442 Personal history of urinary calculi: Secondary | ICD-10-CM | POA: Diagnosis not present

## 2017-05-12 DIAGNOSIS — Z79899 Other long term (current) drug therapy: Secondary | ICD-10-CM | POA: Insufficient documentation

## 2017-05-12 DIAGNOSIS — I82409 Acute embolism and thrombosis of unspecified deep veins of unspecified lower extremity: Secondary | ICD-10-CM

## 2017-05-12 DIAGNOSIS — R600 Localized edema: Secondary | ICD-10-CM | POA: Diagnosis present

## 2017-05-12 DIAGNOSIS — M109 Gout, unspecified: Secondary | ICD-10-CM | POA: Diagnosis not present

## 2017-05-12 DIAGNOSIS — K219 Gastro-esophageal reflux disease without esophagitis: Secondary | ICD-10-CM | POA: Insufficient documentation

## 2017-05-12 DIAGNOSIS — I10 Essential (primary) hypertension: Secondary | ICD-10-CM | POA: Insufficient documentation

## 2017-05-12 DIAGNOSIS — F329 Major depressive disorder, single episode, unspecified: Secondary | ICD-10-CM | POA: Insufficient documentation

## 2017-05-12 DIAGNOSIS — N400 Enlarged prostate without lower urinary tract symptoms: Secondary | ICD-10-CM | POA: Diagnosis not present

## 2017-05-12 DIAGNOSIS — G8929 Other chronic pain: Secondary | ICD-10-CM | POA: Diagnosis not present

## 2017-05-12 DIAGNOSIS — J449 Chronic obstructive pulmonary disease, unspecified: Secondary | ICD-10-CM | POA: Diagnosis not present

## 2017-05-12 DIAGNOSIS — M7989 Other specified soft tissue disorders: Secondary | ICD-10-CM | POA: Diagnosis not present

## 2017-05-12 HISTORY — DX: Acute embolism and thrombosis of unspecified deep veins of unspecified lower extremity: I82.409

## 2017-05-12 LAB — CBC
HCT: 43.3 % (ref 40.0–52.0)
HEMOGLOBIN: 15.2 g/dL (ref 13.0–18.0)
MCH: 32.4 pg (ref 26.0–34.0)
MCHC: 35.1 g/dL (ref 32.0–36.0)
MCV: 92.3 fL (ref 80.0–100.0)
PLATELETS: 129 10*3/uL — AB (ref 150–440)
RBC: 4.7 MIL/uL (ref 4.40–5.90)
RDW: 13.6 % (ref 11.5–14.5)
WBC: 8.8 10*3/uL (ref 3.8–10.6)

## 2017-05-12 LAB — BASIC METABOLIC PANEL
Anion gap: 8 (ref 5–15)
BUN: 22 mg/dL — ABNORMAL HIGH (ref 6–20)
CALCIUM: 9 mg/dL (ref 8.9–10.3)
CHLORIDE: 104 mmol/L (ref 101–111)
CO2: 26 mmol/L (ref 22–32)
CREATININE: 1.3 mg/dL — AB (ref 0.61–1.24)
GFR, EST NON AFRICAN AMERICAN: 53 mL/min — AB (ref >60)
Glucose, Bld: 170 mg/dL — ABNORMAL HIGH (ref 65–99)
Potassium: 3.4 mmol/L — ABNORMAL LOW (ref 3.5–5.1)
SODIUM: 138 mmol/L (ref 135–145)

## 2017-05-12 LAB — HEPATIC FUNCTION PANEL
ALT: 33 U/L (ref 17–63)
AST: 20 U/L (ref 15–41)
Albumin: 3.6 g/dL (ref 3.5–5.0)
Alkaline Phosphatase: 124 U/L (ref 38–126)
Bilirubin, Direct: 0.1 mg/dL (ref 0.1–0.5)
Indirect Bilirubin: 0.6 mg/dL (ref 0.3–0.9)
TOTAL PROTEIN: 6.6 g/dL (ref 6.5–8.1)
Total Bilirubin: 0.7 mg/dL (ref 0.3–1.2)

## 2017-05-12 NOTE — ED Provider Notes (Signed)
[facility] Emergency Department Provider Note  Time seen: 10:46 PM  I have reviewed the triage vital signs and the nursing notes.   HISTORY  Chief Complaint Leg Swelling    HPI Ralph Marsh is a 71 y.o. male with a past medical history of hyperlipidemia presents to the emergency department for left lower extremity swelling. According to the patient for the past 4 days he has noted some swelling in the left leg which has worsened over the past 2 days. States mild discomfort which he rates as a 3/10 dull pain in the calf up into the thigh. Denies any chest pain or trouble breathing. Denies any long travel or hormonal use. No history of DVT or PE in the past.  Past Medical History:  Diagnosis Date  . BPH (benign prostatic hyperplasia)   . Cancer ([place])   . Kidney stone     Patient Active Problem List   Diagnosis Date Noted  . Obstructive uropathy 01/20/2017    Past Surgical History:  Procedure Laterality Date  . EXTRACORPOREAL SHOCK WAVE LITHOTRIPSY Left 01/25/2017   Procedure: EXTRACORPOREAL SHOCK WAVE LITHOTRIPSY (ESWL);  Surgeon: Nickie Retort, MD;  Location: [facility] ORS;  Service: Urology;  Laterality: Left;  . LASER OF PROSTATE W/ GREEN LIGHT PVP  2008   Dr. Quillian Quince  . TONSILLECTOMY AND ADENOIDECTOMY    . URETEROSCOPY WITH HOLMIUM LASER LITHOTRIPSY  2004    Prior to Admission medications   Medication Sig Start Date End Date Taking? Authorizing Provider  allopurinol (ZYLOPRIM) 300 MG tablet Take 300 mg by mouth daily.    [provider]  aspirin 81 MG tablet Take 81 mg by mouth daily.    [provider]  Cholecalciferol 1000 UNITS tablet Take 1,000 Units by mouth daily.    [provider]  diclofenac sodium (VOLTAREN) 1 % GEL Apply 2 g topically as needed.    [provider]  Fish Oil-Cholecalciferol (FISH OIL + D3 PO) Take 1 capsule by mouth daily.     [provider]  lisinopril (PRINIVIL,ZESTRIL)  10 MG tablet Take 10 mg by mouth daily.    [provider]  morphine (MS CONTIN) 15 MG 12 hr tablet Take 15 mg by mouth every 12 (twelve) hours.    [provider]  Multiple Vitamin (MULTIVITAMIN) tablet Take 1 tablet by mouth daily.    [provider]  Nutritional Supplements (JUICE PLUS FIBRE) LIQD Take 1 Dose by mouth daily.    [provider]  pantoprazole (PROTONIX) 40 MG tablet Take 40 mg by mouth daily.    [provider]  tamsulosin (FLOMAX) 0.4 MG CAPS capsule Take 1 capsule (0.4 mg total) by mouth daily after supper. Patient not taking: Reported on 02/07/2017 01/21/17   Hillary Bow, MD  venlafaxine XR (EFFEXOR-XR) 150 MG 24 hr capsule Take 150 mg by mouth daily with breakfast.    [provider]    Allergies  Allergen Reactions  . Latex Other (See Comments)  . Other Anaphylaxis  . Solifenacin Other (See Comments)  . Sulfa Antibiotics Anaphylaxis    Family History  Problem Relation Age of Onset  . Kidney disease Neg Hx   . Kidney cancer Neg Hx   . Prostate cancer Neg Hx     Social History Social History  Substance Use Topics  . Smoking status: Former Research scientist (life sciences)  . Smokeless tobacco: Never Used  . Alcohol use 4.2 oz/week    7 Standard drinks or equivalent  per week    Review of Systems Constitutional: Negative for fever. Cardiovascular: Negative for chest pain. Respiratory: Negative for shortness of breath. Gastrointestinal: Negative for abdominal pain Musculoskeletal: Left leg discomfort and swelling 4 days Neurological: Negative for headache All other ROS negative  ____________________________________________   PHYSICAL EXAM:  VITAL SIGNS: ED Triage Vitals  Enc Vitals Group     BP 05/12/17 2157 137/83     Pulse Rate 05/12/17 2157 96     Resp 05/12/17 2157 14     Temp 05/12/17 2157 99.3 F (37.4 C)     Temp Source 05/12/17 2157 Oral     SpO2 05/12/17 2157 93 %     Weight 05/12/17 2155 215 lb (97.5 kg)      Height 05/12/17 2155 6\' 1"  (1.854 m)     Head Circumference --      Peak Flow --      Pain Score 05/12/17 2153 4     Pain Loc --      Pain Edu? --      Excl. in August? --     Constitutional: Alert and oriented. Well appearing and in no distress. Eyes: Normal exam ENT   Head: Normocephalic and atraumatic.   Mouth/Throat: Mucous membranes are moist. Cardiovascular: Normal rate, regular rhythm. No murmur Respiratory: Normal respiratory effort without tachypnea nor retractions. Breath sounds are clear  Gastrointestinal: Soft and nontender. No distention.   Musculoskeletal: Moderate left lower extremity edema with mild calf tenderness and mild inner thigh tenderness. No noted erythema. Neurovascular intact, 2+ DP pulse. Neurologic:  Normal speech and language. No gross focal neurologic deficits Skin:  Skin is warm, dry and intact.  Psychiatric: Mood and affect are normal.      RADIOLOGY  Ultrasound pending  ____________________________________________   INITIAL IMPRESSION / ASSESSMENT AND PLAN / ED COURSE  Pertinent labs & imaging results that were available during my care of the patient were reviewed by me and considered in my medical decision making (see chart for details).  The patient presents in the emergency department for left leg swelling for the past 4 days. On exam the patient has mild calf tenderness, he does have moderate edema in the left lower extremity with no edema in the right lower extremity, neurovascular intact with 2+ DP pulses. We will obtain an ultrasound to evaluate. Leg is suspicious for DVT, no obvious signs of cellulitis.  ____________________________________________   FINAL CLINICAL IMPRESSION(S) / ED DIAGNOSES  Left lower extremity edema    Harvest Dark, MD 05/12/17 2305

## 2017-05-12 NOTE — ED Notes (Signed)
Patient up to room commode at this time. Wife at bedside.

## 2017-05-12 NOTE — ED Provider Notes (Signed)
Clinical Course as of May 13 38  Sat May 12, 2017  2340 Assuming care from Dr. Kerman Passey.  In short, Ralph Marsh is a 71 y.o. male with a chief complaint of leg swelling.  Refer to the original H&P for additional details.  The current plan of care is to follow up on ultrasound and reassess.   [CF]  Sun May 13, 2017  0015 Radiology called to inform me of the extensive occlusive thrombus in the patient's leg.  Updating patient, will admit given extent of occlusive clot and need for in-person vascular consultation.  [CF]  0881 Spoke with Dr. Ara Kussmaul who will admit.  [CF]    Clinical Course User Index [CF] Hinda Kehr, MD      Hinda Kehr, MD 05/13/17 [phone]

## 2017-05-12 NOTE — ED Notes (Signed)
Patient taken to US.

## 2017-05-12 NOTE — ED Triage Notes (Signed)
Pt c/o unilateral left leg swelling x3 days. Reports pain in left calf, groin, hip, and buttocks. Left petal pulses strong.

## 2017-05-13 ENCOUNTER — Encounter: Payer: Self-pay | Admitting: Nurse Practitioner

## 2017-05-13 DIAGNOSIS — R600 Localized edema: Secondary | ICD-10-CM | POA: Diagnosis not present

## 2017-05-13 DIAGNOSIS — I82409 Acute embolism and thrombosis of unspecified deep veins of unspecified lower extremity: Secondary | ICD-10-CM

## 2017-05-13 DIAGNOSIS — I82513 Chronic embolism and thrombosis of femoral vein, bilateral: Secondary | ICD-10-CM | POA: Diagnosis present

## 2017-05-13 DIAGNOSIS — I82412 Acute embolism and thrombosis of left femoral vein: Secondary | ICD-10-CM | POA: Diagnosis present

## 2017-05-13 LAB — CBC
HCT: 41.8 % (ref 40.0–52.0)
Hemoglobin: 14.7 g/dL (ref 13.0–18.0)
MCH: 32.4 pg (ref 26.0–34.0)
MCHC: 35.1 g/dL (ref 32.0–36.0)
MCV: 92.5 fL (ref 80.0–100.0)
PLATELETS: 119 10*3/uL — AB (ref 150–440)
RBC: 4.52 MIL/uL (ref 4.40–5.90)
RDW: 13.7 % (ref 11.5–14.5)
WBC: 9.1 10*3/uL (ref 3.8–10.6)

## 2017-05-13 LAB — BASIC METABOLIC PANEL
Anion gap: 7 (ref 5–15)
BUN: 21 mg/dL — ABNORMAL HIGH (ref 6–20)
CO2: 27 mmol/L (ref 22–32)
CREATININE: 1.09 mg/dL (ref 0.61–1.24)
Calcium: 8.6 mg/dL — ABNORMAL LOW (ref 8.9–10.3)
Chloride: 106 mmol/L (ref 101–111)
Glucose, Bld: 143 mg/dL — ABNORMAL HIGH (ref 65–99)
Potassium: 3.3 mmol/L — ABNORMAL LOW (ref 3.5–5.1)
SODIUM: 140 mmol/L (ref 135–145)

## 2017-05-13 LAB — HEPARIN LEVEL (UNFRACTIONATED): Heparin Unfractionated: 0.51 IU/mL (ref 0.30–0.70)

## 2017-05-13 LAB — PROTIME-INR
INR: 0.97
PROTHROMBIN TIME: 12.9 s (ref 11.4–15.2)

## 2017-05-13 LAB — MAGNESIUM: MAGNESIUM: 1.6 mg/dL — AB (ref 1.7–2.4)

## 2017-05-13 LAB — PHOSPHORUS: PHOSPHORUS: 3 mg/dL (ref 2.5–4.6)

## 2017-05-13 LAB — APTT: aPTT: 30 seconds (ref 24–36)

## 2017-05-13 MED ORDER — SENNOSIDES-DOCUSATE SODIUM 8.6-50 MG PO TABS: 1.0000 | ORAL_TABLET | Freq: Every evening | ORAL | Status: DC | PRN

## 2017-05-13 MED ORDER — ALBUTEROL SULFATE (2.5 MG/3ML) 0.083% IN NEBU: 2.5000 mg | INHALATION_SOLUTION | Freq: Four times a day (QID) | RESPIRATORY_TRACT | Status: DC | PRN

## 2017-05-13 MED ORDER — HEPARIN (PORCINE) IN NACL 100-0.45 UNIT/ML-% IJ SOLN
1500.0000 [IU]/h | INTRAMUSCULAR | Status: AC
  Administered 2017-05-13: 1500 [IU]/h via INTRAVENOUS
  Filled 2017-05-13: qty 250

## 2017-05-13 MED ORDER — APIXABAN 5 MG PO TABS: ORAL_TABLET | ORAL | 1 refills | Status: DC

## 2017-05-13 MED ORDER — ONDANSETRON HCL 4 MG/2ML IJ SOLN: 4.0000 mg | Freq: Four times a day (QID) | INTRAMUSCULAR | Status: DC | PRN

## 2017-05-13 MED ORDER — VITAMIN D 1000 UNITS PO TABS
1000.0000 [IU] | ORAL_TABLET | Freq: Every day | ORAL | Status: DC
  Administered 2017-05-13: 1000 [IU] via ORAL
  Filled 2017-05-13: qty 1

## 2017-05-13 MED ORDER — ENSURE ENLIVE PO LIQD
237.0000 mL | Freq: Every day | ORAL | Status: DC
  Administered 2017-05-13: 237 mL via ORAL

## 2017-05-13 MED ORDER — ACETAMINOPHEN 650 MG RE SUPP: 650.0000 mg | Freq: Four times a day (QID) | RECTAL | Status: DC | PRN

## 2017-05-13 MED ORDER — IPRATROPIUM BROMIDE 0.02 % IN SOLN: 0.5000 mg | Freq: Four times a day (QID) | RESPIRATORY_TRACT | Status: DC | PRN

## 2017-05-13 MED ORDER — ALLOPURINOL 100 MG PO TABS
300.0000 mg | ORAL_TABLET | Freq: Every day | ORAL | Status: DC
  Administered 2017-05-13: 300 mg via ORAL
  Filled 2017-05-13: qty 3

## 2017-05-13 MED ORDER — SODIUM CHLORIDE 0.9 % IV SOLN
INTRAVENOUS | Status: DC
  Administered 2017-05-13: 02:00:00 via INTRAVENOUS

## 2017-05-13 MED ORDER — ASPIRIN EC 81 MG PO TBEC
81.0000 mg | DELAYED_RELEASE_TABLET | Freq: Every day | ORAL | Status: DC
  Administered 2017-05-13: 81 mg via ORAL
  Filled 2017-05-13: qty 1

## 2017-05-13 MED ORDER — ONDANSETRON HCL 4 MG PO TABS
4.0000 mg | ORAL_TABLET | Freq: Four times a day (QID) | ORAL | Status: DC | PRN
  Filled 2017-05-13: qty 1

## 2017-05-13 MED ORDER — APIXABAN 5 MG PO TABS: 5.0000 mg | ORAL_TABLET | Freq: Two times a day (BID) | ORAL | Status: DC

## 2017-05-13 MED ORDER — MAGNESIUM CITRATE PO SOLN
1.0000 | Freq: Once | ORAL | Status: DC | PRN
  Filled 2017-05-13: qty 296

## 2017-05-13 MED ORDER — ADULT MULTIVITAMIN W/MINERALS CH
1.0000 | ORAL_TABLET | Freq: Every day | ORAL | Status: DC
  Administered 2017-05-13: 1 via ORAL
  Filled 2017-05-13: qty 1

## 2017-05-13 MED ORDER — VENLAFAXINE HCL ER 75 MG PO CP24
150.0000 mg | ORAL_CAPSULE | Freq: Every day | ORAL | Status: DC
  Administered 2017-05-13: 150 mg via ORAL
  Filled 2017-05-13: qty 2

## 2017-05-13 MED ORDER — PANTOPRAZOLE SODIUM 40 MG PO TBEC
40.0000 mg | DELAYED_RELEASE_TABLET | Freq: Every day | ORAL | Status: DC
  Administered 2017-05-13: 40 mg via ORAL
  Filled 2017-05-13: qty 1

## 2017-05-13 MED ORDER — BISACODYL 5 MG PO TBEC: 5.0000 mg | DELAYED_RELEASE_TABLET | Freq: Every day | ORAL | Status: DC | PRN

## 2017-05-13 MED ORDER — MORPHINE SULFATE ER 15 MG PO TBCR
15.0000 mg | EXTENDED_RELEASE_TABLET | Freq: Two times a day (BID) | ORAL | Status: DC
  Administered 2017-05-13: 15 mg via ORAL
  Filled 2017-05-13 (×2): qty 1

## 2017-05-13 MED ORDER — OXYCODONE HCL 5 MG PO TABS
5.0000 mg | ORAL_TABLET | ORAL | Status: DC | PRN
  Filled 2017-05-13 (×2): qty 1

## 2017-05-13 MED ORDER — LISINOPRIL 10 MG PO TABS
10.0000 mg | ORAL_TABLET | Freq: Every day | ORAL | Status: DC
  Administered 2017-05-13: 10 mg via ORAL
  Filled 2017-05-13: qty 1

## 2017-05-13 MED ORDER — ACETAMINOPHEN 325 MG PO TABS: 650.0000 mg | ORAL_TABLET | Freq: Four times a day (QID) | ORAL | Status: DC | PRN

## 2017-05-13 MED ORDER — APIXABAN 5 MG PO TABS
10.0000 mg | ORAL_TABLET | Freq: Two times a day (BID) | ORAL | Status: DC
  Administered 2017-05-13: 10 mg via ORAL
  Filled 2017-05-13: qty 2

## 2017-05-13 NOTE — ED Notes (Signed)
Verified Heparin with RN Lelon Frohlich C.

## 2017-05-13 NOTE — ED Notes (Signed)
Report called to [place] on [place]. Informed RN that we are waiting on Heparin.

## 2017-05-13 NOTE — Care Management Note (Signed)
Case Management Note  Patient Details  Name: Ralph Marsh MRN: [id number] Date of Birth: 08/10/1946  Subjective/Objective:        Provided [name] with a free Eliquis coupon. He will need to contact his PCP to obtain refills.             Action/Plan:   Expected Discharge Date:                  Expected Discharge Plan:     In-House Referral:     Discharge planning Services     Post Acute Care Choice:    Choice offered to:     DME Arranged:    DME Agency:     HH Arranged:    HH Agency:     Status of Service:     If discussed at [employer] of Stay Meetings, dates discussed:    Additional Comments:  Bryonna Sundby A, RN 05/13/2017, 10:35 AM

## 2017-05-13 NOTE — Progress Notes (Signed)
Patient expressed concern regarding insurance claim being denied from past inpatient visit in March.  Patient's insurance company was not notified of inpatient status and denied their claim.  I spoke with registration to make them aware so that insurance company would verify this inpatient visit.  Christene Slates  05/13/2017   2:53 AM

## 2017-05-13 NOTE — Evaluation (Addendum)
Physical Therapy Evaluation Patient Details Name: MINORU CHAP MRN: [id number] DOB: [birth year] Today's Date: 05/13/2017   History of Present Illness  Bridget Westbrooks is a 71 y.o. male with a known history of BPH, nephrolithiasis, hyperlipidemia, diabetes, GERD, BPH, hypertension, gout, depression presents to the emergency department for evaluation of leg swelling. He was diagnosed with DVT in LLE; Patient has been on heparin drip since 1:00AM this morning;   Clinical Impression  Patient is doing well; He reports being independent in all self care ADLs prior to admittance. Currently he is mod I for bed mobility and transfers. He does require supervision for ambulating 175 feet without assistive device, exhibiting slower gait speed with short step length and 1 episode of unsteadiness. Patient was able to regain balance and demonstrate good gait safety without assistance. He reports slight increase in LLE calf pain to 3/10 with prolonged ambulation which subsided with standing rest break. He reports functioning close to baseline with minimal fatigue. BLE gross strength is WFL. Patient lives at home with his wife who is available to provide supervision as needed. He does not demonstrate need for any additional skilled PT intervention at this time.     Follow Up Recommendations No PT follow up;Supervision - Intermittent    Equipment Recommendations  None recommended by PT    Recommendations for Other Services       Precautions / Restrictions Precautions Precautions: None Restrictions Weight Bearing Restrictions: No      Mobility  Bed Mobility Overal bed mobility: Independent             General bed mobility comments: does not need bedrails or elevated head of bed; independent for supine<>sitting both;   Transfers Overall transfer level: Modified independent               General transfer comment: transfers sit<>Stand without AD, using arm rests requiring increased time but  demonstrating good safety awareness;   Ambulation/Gait Ambulation/Gait assistance: Supervision Ambulation Distance (Feet): 175 Feet Assistive device: None Gait Pattern/deviations: Step-through pattern;Decreased step length - right;Decreased step length - left;Decreased dorsiflexion - right;Decreased dorsiflexion - left Gait velocity: decreased   General Gait Details: demonstrates reciprocal gait pattern with slight unsteadiness but able to self correct; Patient does report slight increase in LLE calf pain with slight antalgic gait which subsided with standing rest break; He reports that his walking is about the same as it was before.   Stairs            Wheelchair Mobility    Modified Rankin (Stroke Patients Only)       Balance Overall balance assessment: No apparent balance deficits (not formally assessed)                                           Pertinent Vitals/Pain Pain Assessment: 0-10 Pain Score: 3  Pain Location: LLE calf pain during ambulation; no pain during rest;  Pain Descriptors / Indicators: Aching;Tightness Pain Intervention(s): Limited activity within patient's tolerance;Repositioned;Monitored during session    Home Living Family/patient expects to be discharged to:: Private residence Living Arrangements: Spouse/significant other Available Help at Discharge: Family Type of Home: House Home Access: Stairs to enter Entrance Stairs-Rails: None Entrance Stairs-Number of Steps: 1-2 Home Layout: One level Home Equipment: Cane - single point Additional Comments: was not using any assistive devices;     Prior Function Level of Independence: Independent  Comments: ambulated independently; was driving; was independent in all self care ADLs;      Hand Dominance        Extremity/Trunk Assessment   Upper Extremity Assessment Upper Extremity Assessment: Overall WFL for tasks assessed    Lower Extremity Assessment Lower  Extremity Assessment: Overall WFL for tasks assessed (strength grossly 4/5)    Cervical / Trunk Assessment Cervical / Trunk Assessment: Normal  Communication   Communication: No difficulties  Cognition Arousal/Alertness: Awake/alert Behavior During Therapy: WFL for tasks assessed/performed Overall Cognitive Status: Within Functional Limits for tasks assessed                                 General Comments: oriented x4      General Comments General comments (skin integrity, edema, etc.): minimal swelling noted in LLE as compared to RLE    Exercises     Assessment/Plan    PT Assessment Patent does not need any further PT services  PT Problem List         PT Treatment Interventions      PT Goals (Current goals can be found in the Care Plan section)  Acute Rehab PT Goals Patient Stated Goal: "I want to go home."  PT Goal Formulation: With patient Time For Goal Achievement: 05/13/17 Potential to Achieve Goals: Good    Frequency     Barriers to discharge        Co-evaluation               AM-PAC PT "6 Clicks" Daily Activity  Outcome Measure Difficulty turning over in bed (including adjusting bedclothes, sheets and blankets)?: A Little Difficulty moving from lying on back to sitting on the side of the bed? : A Little Difficulty sitting down on and standing up from a chair with arms (e.g., wheelchair, bedside commode, etc,.)?: A Little Help needed moving to and from a bed to chair (including a wheelchair)?: None Help needed walking in hospital room?: None Help needed climbing 3-5 steps with a railing? : A Little 6 Click Score: 20    End of Session Equipment Utilized During Treatment: Gait belt Activity Tolerance: Patient tolerated treatment well Patient left: in bed;with call bell/phone within reach;with family/visitor present Nurse Communication: Mobility status PT Visit Diagnosis: Unsteadiness on feet (R26.81)    Time: 7673-4193 PT Time  Calculation (min) (ACUTE ONLY): 12 min   Charges:   PT Evaluation $PT Eval Low Complexity: 1 Procedure     PT G Codes:   PT G-Codes **NOT FOR INPATIENT CLASS** Functional Assessment Tool Used: AM-PAC 6 Clicks Basic Mobility;Clinical judgement Functional Limitation: Mobility: Walking and moving around Mobility: Walking and Moving Around Current Status (X9024): At least 1 percent but less than 20 percent impaired, limited or restricted Mobility: Walking and Moving Around Goal Status [phone]): At least 1 percent but less than 20 percent impaired, limited or restricted  Mobility: walking and moving around discharge status (H2992): At least 1 percent but less than 20 percent impaired, limited or restricted     Nickia Boesen PT, DPT 05/13/2017, 1:47 PM

## 2017-05-13 NOTE — Progress Notes (Signed)
Pt to be discharged per Md order. IV removed. Instructions reviewed with pt and family about new medications. Scripts given. Pt and spouse verbalize understanding of new diagnosis and medications. Will discharge in wheelchair.

## 2017-05-13 NOTE — Progress Notes (Signed)
[facility] [place] NAME: Ralph Marsh    MR#:  [id number]  DATE OF BIRTH:  11-Jun-1946  SUBJECTIVE:   Patient here due to left lower extremity swelling and pain and noted to have an extensive left lower extremity DVT.  REVIEW OF SYSTEMS:    Review of Systems  Constitutional: Negative for chills and fever.  HENT: Negative for congestion and tinnitus.   Eyes: Negative for blurred vision and double vision.  Respiratory: Negative for cough, shortness of breath and wheezing.   Cardiovascular: Positive for leg swelling. Negative for chest pain, orthopnea and PND.  Gastrointestinal: Negative for abdominal pain, diarrhea, nausea and vomiting.  Genitourinary: Negative for dysuria and hematuria.  Neurological: Negative for dizziness, sensory change and focal weakness.  All other systems reviewed and are negative.   Nutrition: Heart Healthy/Carb control.   Tolerating Diet: Yes Tolerating PT: Await Eval.    DRUG ALLERGIES:   Allergies  Allergen Reactions  . Latex Other (See Comments)  . Other Anaphylaxis  . Solifenacin Other (See Comments)  . Sulfa Antibiotics Anaphylaxis    VITALS:  Blood pressure 110/60, pulse 78, temperature 98.5 F (36.9 C), temperature source Oral, resp. rate 20, height 6\' 1"  (1.854 m), weight 99.6 kg (219 lb 8 oz), SpO2 94 %.  PHYSICAL EXAMINATION:   Physical Exam  GENERAL:  71 y.o.-year-old patient lying in bed in no acute distress.  EYES: Pupils equal, round, reactive to light and accommodation. No scleral icterus. Extraocular muscles intact.  HEENT: Head atraumatic, normocephalic. Oropharynx and nasopharynx clear.  NECK:  Supple, no jugular venous distention. No thyroid enlargement, no tenderness.  LUNGS: Normal breath sounds bilaterally, no wheezing, rales, rhonchi. No use of accessory muscles of respiration.  CARDIOVASCULAR: S1, S2 normal. No murmurs, rubs, or gallops.  ABDOMEN: Soft, nontender,  nondistended. Bowel sounds present. No organomegaly or mass.  EXTREMITIES: No cyanosis, clubbing, + 1 edema on LLE compared to Right.   NEUROLOGIC: Cranial nerves II through XII are intact. No focal Motor or sensory deficits b/l.   PSYCHIATRIC: The patient is alert and oriented x 3.  SKIN: No obvious rash, lesion, or ulcer.    LABORATORY PANEL:   CBC  Recent Labs Lab 05/13/17 0439  WBC 9.1  HGB 14.7  HCT 41.8  PLT 119*   ------------------------------------------------------------------------------------------------------------------  Chemistries   Recent Labs Lab 05/12/17 2202 05/13/17 0439  NA 138 140  K 3.4* 3.3*  CL 104 106  CO2 26 27  GLUCOSE 170* 143*  BUN 22* 21*  CREATININE 1.30* 1.09  CALCIUM 9.0 8.6*  MG 1.6*  --   AST 20  --   ALT 33  --   ALKPHOS 124  --   BILITOT 0.7  --    ------------------------------------------------------------------------------------------------------------------  Cardiac Enzymes No results for input(s): TROPONINI in the last 168 hours. ------------------------------------------------------------------------------------------------------------------  RADIOLOGY:  US Venous Img Lower Unilateral Left  Result Date: 05/13/2017 CLINICAL DATA:  Left leg pain and swelling for 3 days. EXAM: LEFT LOWER EXTREMITY VENOUS DOPPLER ULTRASOUND TECHNIQUE: Gray-scale sonography with graded compression, as well as color Doppler and duplex ultrasound were performed to evaluate the lower extremity deep venous systems from the level of the common femoral vein and including the common femoral, femoral, profunda femoral, popliteal and calf veins including the posterior tibial, peroneal and gastrocnemius veins when visible. The superficial great saphenous vein was also interrogated. Spectral Doppler was utilized to evaluate flow at rest and with distal augmentation maneuvers in  the common femoral, femoral and popliteal veins. COMPARISON:  None. FINDINGS:  Contralateral Common Femoral Vein: Respiratory phasicity is normal and symmetric with the symptomatic side. No evidence of thrombus. Normal compressibility. Common Femoral Vein: Occlusive thrombus. No compressibility or flow. Saphenofemoral Junction: Occlusive thrombus, no compressibility or flow. Profunda Femoral Vein: Occlusive thrombus, no compressibility or flow. Femoral Vein: Occlusive thrombus in the proximal and midportion, no compressibility or flow. Some flow seen distally. Popliteal Vein: No evidence of thrombus. Normal compressibility, respiratory phasicity and response to augmentation. Calf Veins: No evidence of thrombus. Normal compressibility and flow on color Doppler imaging. Superficial Great Saphenous Vein: Thrombus at the saphenous femoral junction. Venous Reflux:  None. Other Findings:  None. Critical Value/emergent results were called by telephone at the time of interpretation on 05/13/2017 at 12:04 am to Dr. Karma Greaser , who verbally acknowledged these results. IMPRESSION: Positive for rather extensive occlusive thrombus in the left lower extremity from the common femoral vein in the groin distally through the distal femoral vein. Electronically Signed   By: Jeb Levering M.D.   On: 05/13/2017 00:07     ASSESSMENT AND PLAN:   71 year old male with past medical history of hypertension, gout, COPD, suspected prostate cancer who presents to the hospital due to left lower extreme swelling and pain and noted to have an extensive DVT.  1. Left lower extremity DVT-this is the cause of patient's left lower extremity swelling redness and pain. -Patient's pain has significantly improved with treatment with heparin. -Await further vascular surgery input. Patient can likely be switched over to Eliquis and be discharged on Eliquis and he has been given a coupon for 30 days. [facility] if [facility] would [facility] Eliquis if not he can be switched back over to Lovenox and  Coumadin.  2. History of gout-no acute attack-continue allopurinol.  3. Chronic pain-continue MS Contin, oxycodone as needed..  4. Essential hypertension-continue lisinopril.  5. GERD-continue Protonix.  6. Pressure-continue Effexor.   All the records are reviewed and case discussed with Care Management/Social Worker. Management plans discussed with the patient, family and they are in agreement.  CODE STATUS: Full code  DVT Prophylaxis: Heparin gtt  TOTAL TIME TAKING CARE OF THIS PATIENT: 30 minutes.   POSSIBLE D/C IN 1-2 DAYS, DEPENDING ON CLINICAL CONDITION.   Henreitta Leber M.D on 05/13/2017 at 11:47 AM  Between 7am to 6pm - Pager - [phone]  After 6pm go to [email or web address] - password [email or web address] [facility]  Office  [phone]  CC: Primary care physician; Marguerita Merles, MD

## 2017-05-13 NOTE — H&P (Signed)
History and Physical   [facility] - [place] @ [facility] Admission History and Physical [employer], D.O.    Patient Name: Ralph Marsh MR#: [id number] Date of Birth: January 17, 1946 Date of Admission: 05/12/2017  Referring MD/NP/PA: Dr. Karma Greaser Primary Care Physician: Marguerita Merles, MD   Chief Complaint:  Chief Complaint  Patient presents with  . Leg Swelling    HPI: Ralph Marsh is a 71 y.o. male with a known history of BPH, nephrolithiasis, hyperlipidemia, diabetes, GERD, BPH, hypertension, gout, depression presents to the emergency department for evaluation of leg swelling.  Patient was in a usual state of health until about 4 days ago when he noticed left lower extremity swelling which is been progressively worsening over the past 2 days associated with pain in the left half knee and mid thigh.    Patient denies fevers/chills, weakness, dizziness, chest pain, shortness of breath, N/V/C/D, abdominal pain, dysuria/frequency, changes in mental status.    He states that he has been worked up at [facility] for prostate cancer - indicated that he has been diagnosed with prostate cancer "but they can't find it."  No prior history of DVT/PE, no prolonged travel or immobility, no family history of VTE.  Otherwise there has been no change in status. Patient has been taking medication as prescribed and there has been no recent change in medication or diet.  No recent antibiotics.  There has been no recent illness, hospitalizations, travel or sick contacts.    EMS/ED Course: Patient received heparin drip. Medical admission was requested for further treatment of an extensive occlusive thrombus in the left leg and vascular consultation.  Review of Systems:  CONSTITUTIONAL: No fever/chills, fatigue, weakness, weight gain/loss, headache. EYES: No blurry or double vision. ENT: No tinnitus, postnasal drip, redness or soreness of the oropharynx. RESPIRATORY: No cough, dyspnea, wheeze.  No hemoptysis.   CARDIOVASCULAR: No chest pain, palpitations, syncope, orthopnea. No lower extremity edema.  GASTROINTESTINAL: No nausea, vomiting, abdominal pain, diarrhea, constipation.  No hematemesis, melena or hematochezia. GENITOURINARY: No dysuria, frequency, hematuria. ENDOCRINE: No polyuria or nocturia. No heat or cold intolerance. HEMATOLOGY: No anemia, bruising, bleeding. INTEGUMENTARY: No rashes, ulcers, lesions. MUSCULOSKELETAL: Positive left leg pain as per history of present illness No arthritis, gout, dyspnea. NEUROLOGIC: No numbness, tingling, ataxia, seizure-type activity, weakness. PSYCHIATRIC: No anxiety, depression, insomnia.   Past Medical History:  Diagnosis Date  . BPH (benign prostatic hyperplasia)   . Cancer ([place])   . Kidney stone   Active Problems Reconcile with Patient's Chart  Problem Noted Date  Heart palpitations 11/24/2016  Atypical chest pain 11/24/2016  Type 2 diabetes mellitus without complications (CMS-HCC) 62/95/2841  Other chest pain 11/02/2016  Urgency of urination 06/27/2016  Vitamin D deficiency, unspecified 10/07/2015  Elevated prostate specific antigen (PSA) 05/11/2015  Abnormal levels of other serum enzymes 03/30/2015  Fatigue 03/29/2015  Loose stools 03/29/2015  Benign neoplasm of colon, unspecified 05/25/2014  Gastro-esophageal reflux disease without esophagitis 05/19/2013  Enlarged prostate without lower urinary tract symptoms (luts) 08/28/2012  Post-traumatic stress disorder, unspecified 08/28/2012  Dorsalgia, unspecified 07/17/2012  Well adult exam 05/16/2012  Other specified abnormal findings of blood chemistry 11/01/2010  Idiopathic gout 08/11/2010  Zoster without complications 32/44/0102  Mixed hyperlipidemia 11/20/2007  Hypertension   Elevated PSA   Gout   BPH (benign prostatic hyperplasia)   Depression, unspecified   GERD (gastroesophageal reflux disease)      Past Surgical History:  Procedure Laterality Date  .  EXTRACORPOREAL SHOCK WAVE LITHOTRIPSY Left 01/25/2017   Procedure:  EXTRACORPOREAL SHOCK WAVE LITHOTRIPSY (ESWL);  Surgeon: Nickie Retort, MD;  Location: [facility] ORS;  Service: Urology;  Laterality: Left;  . LASER OF PROSTATE W/ GREEN LIGHT PVP  2008   Dr. Quillian Quince  . TONSILLECTOMY AND ADENOIDECTOMY    . URETEROSCOPY WITH HOLMIUM LASER LITHOTRIPSY  2004  Surgical History   Surgery Date Laterality Comments  COLONOSCOPY 06/26/2011  Adenomatous Polyps  EGD 06/26/2011  No repeat per RTE (dw)  COLONOSCOPY 07/01/2014  Adenomatous Polyp: CBF 06/2019  TONSILLECTOMY & ADENOIDECTOMY 1954    laminectomy [place] prostate treatment 2000        reports that he has quit smoking. He has never used smokeless tobacco. He reports that he drinks about 4.2 oz of alcohol per week . He reports that he does not use drugs.  Allergies  Allergen Reactions  . Latex Other (See Comments)  . Other Anaphylaxis  . Solifenacin Other (See Comments)  . Sulfa Antibiotics Anaphylaxis    Family History  Problem Relation Age of Onset  . Kidney disease Neg Hx   . Kidney cancer Neg Hx   . Prostate cancer Neg Hx     Prior to Admission medications   Medication Sig Start Date End Date Taking? Authorizing Provider  allopurinol (ZYLOPRIM) 300 MG tablet Take 300 mg by mouth daily.    [provider]  aspirin 81 MG tablet Take 81 mg by mouth daily.    [provider]  Cholecalciferol 1000 UNITS tablet Take 1,000 Units by mouth daily.    [provider]  diclofenac sodium (VOLTAREN) 1 % GEL Apply 2 g topically as needed.    [provider]  Fish Oil-Cholecalciferol (FISH OIL + D3 PO) Take 1 capsule by mouth daily.     [provider]  lisinopril (PRINIVIL,ZESTRIL) 10 MG tablet Take 10 mg by mouth daily.    [provider]  morphine (MS CONTIN) 15 MG 12 hr tablet Take 15 mg by mouth every 12 (twelve) hours.    [provider]  Multiple  Vitamin (MULTIVITAMIN) tablet Take 1 tablet by mouth daily.    [provider]  Nutritional Supplements (JUICE PLUS FIBRE) LIQD Take 1 Dose by mouth daily.    [provider]  pantoprazole (PROTONIX) 40 MG tablet Take 40 mg by mouth daily.    [provider]  tamsulosin (FLOMAX) 0.4 MG CAPS capsule Take 1 capsule (0.4 mg total) by mouth daily after supper. Patient not taking: Reported on 02/07/2017 01/21/17   Hillary Bow, MD  venlafaxine XR (EFFEXOR-XR) 150 MG 24 hr capsule Take 150 mg by mouth daily with breakfast.    [provider]    Physical Exam: Vitals:   05/12/17 2155 05/12/17 2157 05/12/17 2259  BP:  137/83 136/90  Pulse:  96 84  Resp:  14 14  Temp:  99.3 F (37.4 C)   TempSrc:  Oral   SpO2:  93% 94%  Weight: 97.5 kg (215 lb)    Height: 6\' 1"  (1.854 m)      GENERAL: 71 y.o.-year-old Male patient, well-developed, well-nourished lying in the bed in no acute distress.  Pleasant and cooperative.   HEENT: Head atraumatic, normocephalic. Pupils equal, round, reactive to light and accommodation. No scleral icterus. . Mucus membranes moist. NECK: Supple, full range of motion.  CHEST: Normal breath sounds bilaterally. No wheezing, rales, rhonchi or crackles. CARDIOVASCULAR: S1, S2 normal. No murmurs, rubs, or gallops. Cap refill <2 seconds. Pulses intact distally.  ABDOMEN: Soft, nondistended, nontender. No rebound, guarding, rigidity.  EXTREMITIES: Positive left lower extremity swelling from mid calf to mid thigh. With tenderness to palpation.  NEUROLOGIC: The patient is alert and oriented x 3. Cranial nerves II through XII are grossly intact with no focal sensorimotor deficit.  PSYCHIATRIC:  Normal affect, mood, thought content. SKIN: Warm, dry, and intact without obvious rash, lesion, or ulcer.    Labs on Admission:  CBC:  Recent Labs Lab 05/12/17 2202  WBC 8.8  HGB 15.2  HCT 43.3  MCV 92.3  PLT 161*   Basic Metabolic  Panel:  Recent Labs Lab 05/12/17 2202  NA 138  K 3.4*  CL 104  CO2 26  GLUCOSE 170*  BUN 22*  CREATININE 1.30*  CALCIUM 9.0   GFR: Estimated Creatinine Clearance: 63.1 mL/min (A) (by C-G formula based on SCr of 1.3 mg/dL (H)). Liver Function Tests:  Recent Labs Lab 05/12/17 2202  AST 20  ALT 33  ALKPHOS 124  BILITOT 0.7  PROT 6.6  ALBUMIN 3.6   No results for input(s): LIPASE, AMYLASE in the last 168 hours. No results for input(s): AMMONIA in the last 168 hours. Coagulation Profile: No results for input(s): INR, PROTIME in the last 168 hours. Cardiac Enzymes: No results for input(s): CKTOTAL, CKMB, CKMBINDEX, TROPONINI in the last 168 hours. BNP (last 3 results) No results for input(s): PROBNP in the last 8760 hours. HbA1C: No results for input(s): HGBA1C in the last 72 hours. CBG: No results for input(s): GLUCAP in the last 168 hours. Lipid Profile: No results for input(s): CHOL, HDL, LDLCALC, TRIG, CHOLHDL, LDLDIRECT in the last 72 hours. Thyroid Function Tests: No results for input(s): TSH, T4TOTAL, FREET4, T3FREE, THYROIDAB in the last 72 hours. Anemia Panel: No results for input(s): VITAMINB12, FOLATE, FERRITIN, TIBC, IRON, RETICCTPCT in the last 72 hours. Urine analysis:    Component Value Date/Time   COLORURINE YELLOW (A) 01/20/2017 0124   APPEARANCEUR CLEAR (A) 01/20/2017 0124   LABSPEC 1.027 01/20/2017 0124   PHURINE 5.0 01/20/2017 0124   GLUCOSEU NEGATIVE 01/20/2017 0124   HGBUR NEGATIVE 01/20/2017 0124   BILIRUBINUR NEGATIVE 01/20/2017 0124   KETONESUR NEGATIVE 01/20/2017 0124   PROTEINUR NEGATIVE 01/20/2017 0124   NITRITE NEGATIVE 01/20/2017 0124   LEUKOCYTESUR NEGATIVE 01/20/2017 0124   Sepsis Labs: @LABRCNTIP (procalcitonin:4,lacticidven:4) )No results found for this or any previous visit (from the past 240 hour(s)).   Radiological Exams on Admission: US Venous Img Lower Unilateral Left  Result Date: 05/13/2017 CLINICAL DATA:  Left leg  pain and swelling for 3 days. EXAM: LEFT LOWER EXTREMITY VENOUS DOPPLER ULTRASOUND TECHNIQUE: Gray-scale sonography with graded compression, as well as color Doppler and duplex ultrasound were performed to evaluate the lower extremity deep venous systems from the level of the common femoral vein and including the common femoral, femoral, profunda femoral, popliteal and calf veins including the posterior tibial, peroneal and gastrocnemius veins when visible. The superficial great saphenous vein was also interrogated. Spectral Doppler was utilized to evaluate flow at rest and with distal augmentation maneuvers in the common femoral, femoral and popliteal veins. COMPARISON:  None. FINDINGS: Contralateral Common Femoral Vein: Respiratory phasicity is normal and symmetric with the symptomatic side. No evidence of thrombus. Normal compressibility. Common Femoral Vein: Occlusive thrombus. No compressibility or flow. Saphenofemoral Junction: Occlusive thrombus, no compressibility or flow. Profunda Femoral Vein: Occlusive thrombus, no compressibility or flow. Femoral Vein: Occlusive thrombus in the proximal and midportion, no compressibility or flow. Some flow seen distally. Popliteal Vein: No  evidence of thrombus. Normal compressibility, respiratory phasicity and response to augmentation. Calf Veins: No evidence of thrombus. Normal compressibility and flow on color Doppler imaging. Superficial Great Saphenous Vein: Thrombus at the saphenous femoral junction. Venous Reflux:  None. Other Findings:  None. Critical Value/emergent results were called by telephone at the time of interpretation on 05/13/2017 at 12:04 am to Dr. Karma Greaser , who verbally acknowledged these results. IMPRESSION: Positive for rather extensive occlusive thrombus in the left lower extremity from the common femoral vein in the groin distally through the distal femoral vein. Electronically Signed   By: Jeb Levering M.D.   On: 05/13/2017 00:07      Assessment/Plan  This is a 71 y.o. male with a history of BPH, nephrolithiasis, hyperlipidemia, diabetes, GERD, BPH, hypertension, gout, depression now being admitted with:  #. Extensive occlusive DVT of the left lower extremity from the common femoral vein through to the distal femoral vein -Admit to observation -Heparin drip -Vascular consult -Pain control - Will need follow up with [facility] for ongoing workup of prostate cancer.  Has appt for August 2018.   #. History of depression -Continue venlafaxine  #. History of GERD -Continue Protonix  #. History of hypertension - Continue lisinopril  Admission status: Observation IV Fluids: Normal saline Diet/Nutrition: Heart healthy, carb controlled Consults called: Vascular surgery  DVT Px: Heparin and early ambulation. Code Status: Full Code  Disposition Plan: To home in 1-2 days  All the records are reviewed and case discussed with ED provider. Management plans discussed with the patient and/or family who express understanding and agree with plan of care.  Astella Desir D.O. on 05/13/2017 at 12:51 AM Between 7am to 6pm - Pager - [phone] After 6pm go to [email or web address] - [employer] [facility] [place] [facility] [phone] CC: Primary care physician; Marguerita Merles, MD   05/13/2017, 12:51 AM

## 2017-05-13 NOTE — Progress Notes (Signed)
ANTICOAGULATION CONSULT NOTE - follow up Ralph Marsh for heparin Indication: DVT  Allergies  Allergen Reactions  . Latex Other (See Comments)  . Other Anaphylaxis  . Solifenacin Other (See Comments)  . Sulfa Antibiotics Anaphylaxis    Patient Measurements: Height: 6\' 1"  (185.4 cm) Weight: 219 lb 8 oz (99.6 kg) IBW/kg (Calculated) : 79.9 Heparin Dosing Weight: 97.5 kg  Vital Signs: Temp: 98.5 F (36.9 C) (07/08 0544) Temp Source: Oral (07/08 0544) BP: 110/60 (07/08 0544) Pulse Rate: 78 (07/08 0544)  Labs:  Recent Labs  05/12/17 2202 05/13/17 0036 05/13/17 0439 05/13/17 0905  HGB 15.2  --  14.7  --   HCT 43.3  --  41.8  --   PLT 129*  --  119*  --   APTT  --  30  --   --   LABPROT  --  12.9  --   --   INR  --  0.97  --   --   HEPARINUNFRC  --   --   --  0.51  CREATININE 1.30*  --  1.09  --     Estimated Creatinine Clearance: 76.1 mL/min (by C-G formula based on SCr of 1.09 mg/dL).   Medical History: Past Medical History:  Diagnosis Date  . BPH (benign prostatic hyperplasia)   . Cancer ([place])   . Kidney stone     Medications:  Scheduled:  . allopurinol  300 mg Oral Daily  . aspirin EC  81 mg Oral Daily  . cholecalciferol  1,000 Units Oral Daily  . feeding supplement (ENSURE ENLIVE)  237 mL Oral Daily  . lisinopril  10 mg Oral Daily  . morphine  15 mg Oral Q12H  . multivitamin with minerals  1 tablet Oral Daily  . pantoprazole  40 mg Oral Daily  . venlafaxine XR  150 mg Oral Q breakfast   Assessment: Patient admitted for LLE swelling for 4 days. Over the past two days has gotten worse in the calf and up to the thigh. [place] of leg: extensive occlusive thrombus.  Goal of Therapy:  Heparin level 0.3-0.7 units/ml Monitor platelets by anticoagulation protocol: Yes   Plan:  Baseline labs ordered. Start heparin infusion at 1500 units/hr and will check a HL @ 0900. Per heparin consult NO BOLUS; ONLY INFUSION PER DR. FORBACH.  7/8 HL@ 0905=  0.51. Will continue current drip rate and check confirmatory level in 8 hrs. Plt 119.  Ralph Marsh PharmD Clinical Pharmacist 05/13/2017

## 2017-05-13 NOTE — Care Management Obs Status (Signed)
[place] NOTIFICATION   Patient Details  Name: Ralph Marsh MRN: [id number] Date of Birth: [birth year]   Medicare Observation Status Notification Given:  No (moon letter)  Discharged in less than 24 hours after admission.     Hilma Steinhilber A, RN 05/13/2017, 12:49 PM

## 2017-05-13 NOTE — Progress Notes (Signed)
ANTICOAGULATION CONSULT NOTE - Initial Consult  Pharmacy Consult for heparin Indication: DVT  Allergies  Allergen Reactions  . Latex Other (See Comments)  . Other Anaphylaxis  . Solifenacin Other (See Comments)  . Sulfa Antibiotics Anaphylaxis    Patient Measurements: Height: 6\' 1"  (185.4 cm) Weight: 215 lb (97.5 kg) IBW/kg (Calculated) : 79.9 Heparin Dosing Weight: 97.5 kg  Vital Signs: Temp: 99.3 F (37.4 C) (07/07 2157) Temp Source: Oral (07/07 2157) BP: 136/90 (07/07 2259) Pulse Rate: 84 (07/07 2259)  Labs:  Recent Labs  05/12/17 2202  HGB 15.2  HCT 43.3  PLT 129*  CREATININE 1.30*    Estimated Creatinine Clearance: 63.1 mL/min (A) (by C-G formula based on SCr of 1.3 mg/dL (H)).   Medical History: Past Medical History:  Diagnosis Date  . BPH (benign prostatic hyperplasia)   . Cancer ([place])   . Kidney stone     Medications:  Scheduled:   Assessment: Patient admitted for LLE swelling for 4 days. Over the past two days has gotten worse in the calf and up to the thigh. [place] of leg: extensive occlusive thrombus.  Goal of Therapy:  Heparin level 0.3-0.7 units/ml Monitor platelets by anticoagulation protocol: Yes   Plan:  Baseline labs ordered. Start heparin infusion at 1500 units/hr and will check a HL @ 0900. Per heparin consult NO BOLUS; ONLY INFUSION PER DR. Karma Greaser.  Tobie Lords, PharmD, BCPS Clinical Pharmacist 05/13/2017

## 2017-05-13 NOTE — Consult Note (Signed)
[facility] Vascular Consult Note  MRN : [id number]  Ralph Marsh is a 71 y.o. ([birth year]) male who presents with chief complaint of  Chief Complaint  Patient presents with  . Leg Swelling  .  History of Present Illness: I am asked to evaluate the patient by Ralph Marsh for DVT of the left lower extremity. The patient is a 71 year old gentleman presented to the hospital yesterday with complaints of increasing swelling in his left lower extremity. He notes that his symptoms began several days ago and were associated with significant pain in the knee and thigh but that the pain has resolved.  He denies fever or chills. No shortness of blood breath or pleuritic chest pain. No hemoptysis.  Patient denies past DVT or PE. There is no family history. He has not been severely or profoundly immobilized lately. No trauma.  Current Facility-Administered Medications  Medication Dose Route Frequency Provider Last Rate Last Dose  . acetaminophen (TYLENOL) tablet 650 mg  650 mg Oral Q6H PRN Marsh, Alexis, DO       Or  . acetaminophen (TYLENOL) suppository 650 mg  650 mg Rectal Q6H PRN Marsh, Alexis, DO      . albuterol (PROVENTIL) (2.5 MG/3ML) 0.083% nebulizer solution 2.5 mg  2.5 mg Nebulization Q6H PRN Marsh, Alexis, DO      . allopurinol (ZYLOPRIM) tablet 300 mg  300 mg Oral Daily Marsh, Alexis, DO   300 mg at 05/13/17 0931  . apixaban (ELIQUIS) tablet 10 mg  10 mg Oral BID Ralph Leber, MD   10 mg at 05/13/17 1405   Followed by  . [START ON 05/20/2017] apixaban (ELIQUIS) tablet 5 mg  5 mg Oral BID Ralph Leber, MD      . aspirin EC tablet 81 mg  81 mg Oral Daily Marsh, Alexis, DO   81 mg at 05/13/17 0932  . bisacodyl (DULCOLAX) EC tablet 5 mg  5 mg Oral Daily PRN Marsh, Alexis, DO      . cholecalciferol (VITAMIN D) tablet 1,000 Units  1,000 Units Oral Daily Marsh, Alexis, DO   1,000 Units at 05/13/17 0931  . feeding supplement  (ENSURE ENLIVE) (ENSURE ENLIVE) liquid 237 mL  237 mL Oral Daily Marsh, Alexis, DO   237 mL at 05/13/17 0932  . ipratropium (ATROVENT) nebulizer solution 0.5 mg  0.5 mg Nebulization Q6H PRN Marsh, Alexis, DO      . lisinopril (PRINIVIL,ZESTRIL) tablet 10 mg  10 mg Oral Daily Marsh, Alexis, DO   10 mg at 05/13/17 0932  . magnesium citrate solution 1 Bottle  1 Bottle Oral Once PRN Marsh, Alexis, DO      . morphine (MS CONTIN) 12 hr tablet 15 mg  15 mg Oral Q12H Marsh, Alexis, DO   15 mg at 05/13/17 1031  . multivitamin with minerals tablet 1 tablet  1 tablet Oral Daily Marsh, Alexis, DO   1 tablet at 05/13/17 0931  . ondansetron (ZOFRAN) tablet 4 mg  4 mg Oral Q6H PRN Marsh, Alexis, DO       Or  . ondansetron (ZOFRAN) injection 4 mg  4 mg Intravenous Q6H PRN Marsh, Alexis, DO      . oxyCODONE (Oxy IR/ROXICODONE) immediate release tablet 5 mg  5 mg Oral Q4H PRN Marsh, Alexis, DO   5 mg at 05/13/17 0931  . pantoprazole (PROTONIX) EC tablet 40 mg  40 mg Oral Daily Marsh, Alexis, DO   40 mg at 05/13/17 0931  .  senna-docusate (Senokot-S) tablet 1 tablet  1 tablet Oral QHS PRN Marsh, Alexis, DO      . venlafaxine XR (EFFEXOR-XR) 24 hr capsule 150 mg  150 mg Oral Q breakfast Marsh, Alexis, DO   150 mg at 05/13/17 0272    Past Medical History:  Diagnosis Date  . BPH (benign prostatic hyperplasia)   . Cancer ([place])   . Kidney stone     Past Surgical History:  Procedure Laterality Date  . EXTRACORPOREAL SHOCK WAVE LITHOTRIPSY Left 01/25/2017   Procedure: EXTRACORPOREAL SHOCK WAVE LITHOTRIPSY (ESWL);  Surgeon: Ralph Retort, MD;  Location: [facility] ORS;  Service: Urology;  Laterality: Left;  . LASER OF PROSTATE W/ GREEN LIGHT PVP  2008   Dr. Quillian Marsh  . TONSILLECTOMY AND ADENOIDECTOMY    . URETEROSCOPY WITH HOLMIUM LASER LITHOTRIPSY  2004    Social History Social History  Substance Use Topics  . Smoking status: Former Research scientist (life sciences)  . Smokeless  tobacco: Never Used  . Alcohol use 4.2 oz/week    7 Standard drinks or equivalent per week    Family History Family History  Problem Relation Age of Onset  . Kidney disease Neg Hx   . Kidney cancer Neg Hx   . Prostate cancer Neg Hx   No family history of bleeding/clotting disorders, porphyria or autoimmune disease   Allergies  Allergen Reactions  . Latex Other (See Comments)  . Other Anaphylaxis  . Solifenacin Other (See Comments)  . Sulfa Antibiotics Anaphylaxis     REVIEW OF SYSTEMS (Negative unless checked)  Constitutional: [] Weight loss  [] Fever  [] Chills Cardiac: [] Chest pain   [] Chest pressure   [] Palpitations   [] Shortness of breath when laying flat   [] Shortness of breath at rest   [] Shortness of breath with exertion. Vascular:  [x] Pain in legs with walking   [] Pain in legs at rest   [] Pain in legs when laying flat   [] Claudication   [] Pain in feet when walking  [] Pain in feet at rest  [] Pain in feet when laying flat   [] History of DVT   [] Phlebitis   [x] Swelling in legs   [] Varicose veins   [] Non-healing ulcers Pulmonary:   [] Uses home oxygen   [] Productive cough   [] Hemoptysis   [] Wheeze  [] COPD   [] Asthma Neurologic:  [] Dizziness  [] Blackouts   [] Seizures   [] History of stroke   [] History of TIA  [] Aphasia   [] Temporary blindness   [] Dysphagia   [] Weakness or numbness in arms   [] Weakness or numbness in legs Musculoskeletal:  [] Arthritis   [] Joint swelling   [] Joint pain   [] Low back pain Hematologic:  [] Easy bruising  [] Easy bleeding   [] Hypercoagulable state   [] Anemic  [] Hepatitis Gastrointestinal:  [] Blood in stool   [] Vomiting blood  [] Gastroesophageal reflux/heartburn   [] Difficulty swallowing. Genitourinary:  [] Chronic kidney disease   [] Difficult urination  [] Frequent urination  [] Burning with urination   [] Blood in urine Skin:  [] Rashes   [] Ulcers   [] Wounds Psychological:  [] History of anxiety   []  History of major depression.  Physical Examination  Vitals:    05/13/17 0056 05/13/17 0206 05/13/17 0544 05/13/17 1204  BP: (!) 144/92 (!) 148/94 110/60 126/77  Pulse: 85 85 78 81  Resp: 14 20 20 16   Temp:  98.3 F (36.8 C) 98.5 F (36.9 C) 98 F (36.7 C)  TempSrc:  Oral Oral Oral  SpO2: 93% 94% 94% 95%  Weight:  99.6 kg (219 lb 8 oz)    Height:  6\' 1"  (1.854 m)     Body mass index is 28.96 kg/m. Gen:  WD/WN, NAD Head: [place]/AT, No temporalis wasting. Prominent temp pulse not noted. Ear/Nose/Throat: Hearing grossly intact, nares w/o erythema or drainage, oropharynx w/o Erythema/Exudate Eyes: Sclera non-icteric, conjunctiva clear Neck: Trachea midline.  No JVD.  Pulmonary:  Good air movement, respirations not labored, equal bilaterally.  Cardiac: RRR, normal S1, S2. Vascular: Both legs are pink warm and well-perfused. The left leg is slightly larger than the right in the thigh area. About the same. The thigh is soft. left thigh is nontender Gastrointestinal: soft, non-tender/non-distended. No guarding/reflex.  Musculoskeletal: M/S 5/5 throughout.  Extremities without ischemic changes.  No deformity or atrophy. No edema. Neurologic: Sensation grossly intact in extremities.  Symmetrical.  Speech is fluent. Motor exam as listed above. Psychiatric: Judgment intact, Mood & affect appropriate for pt's clinical situation. Dermatologic: No rashes or ulcers noted.  No cellulitis or open wounds. Lymph : No Cervical, Axillary, or Inguinal lymphadenopathy.      CBC Lab Results  Component Value Date   WBC 9.1 05/13/2017   HGB 14.7 05/13/2017   HCT 41.8 05/13/2017   MCV 92.5 05/13/2017   PLT 119 (L) 05/13/2017    BMET    Component Value Date/Time   NA 140 05/13/2017 0439   K 3.3 (L) 05/13/2017 0439   CL 106 05/13/2017 0439   CO2 27 05/13/2017 0439   GLUCOSE 143 (H) 05/13/2017 0439   BUN 21 (H) 05/13/2017 0439   CREATININE 1.09 05/13/2017 0439   CALCIUM 8.6 (L) 05/13/2017 0439   GFRNONAA >60 05/13/2017 0439   GFRAA >60 05/13/2017 0439    Estimated Creatinine Clearance: 76.1 mL/min (by C-G formula based on SCr of 1.09 mg/dL).  COAG Lab Results  Component Value Date   INR 0.97 05/13/2017    Radiology US Venous Img Lower Unilateral Left  Result Date: 05/13/2017 CLINICAL DATA:  Left leg pain and swelling for 3 days. EXAM: LEFT LOWER EXTREMITY VENOUS DOPPLER ULTRASOUND TECHNIQUE: Gray-scale sonography with graded compression, as well as color Doppler and duplex ultrasound were performed to evaluate the lower extremity deep venous systems from the level of the common femoral vein and including the common femoral, femoral, profunda femoral, popliteal and calf veins including the posterior tibial, peroneal and gastrocnemius veins when visible. The superficial great saphenous vein was also interrogated. Spectral Doppler was utilized to evaluate flow at rest and with distal augmentation maneuvers in the common femoral, femoral and popliteal veins. COMPARISON:  None. FINDINGS: Contralateral Common Femoral Vein: Respiratory phasicity is normal and symmetric with the symptomatic side. No evidence of thrombus. Normal compressibility. Common Femoral Vein: Occlusive thrombus. No compressibility or flow. Saphenofemoral Junction: Occlusive thrombus, no compressibility or flow. Profunda Femoral Vein: Occlusive thrombus, no compressibility or flow. Femoral Vein: Occlusive thrombus in the proximal and midportion, no compressibility or flow. Some flow seen distally. Popliteal Vein: No evidence of thrombus. Normal compressibility, respiratory phasicity and response to augmentation. Calf Veins: No evidence of thrombus. Normal compressibility and flow on color Doppler imaging. Superficial Great Saphenous Vein: Thrombus at the saphenous femoral junction. Venous Reflux:  None. Other Findings:  None. Critical Value/emergent results were called by telephone at the time of interpretation on 05/13/2017 at 12:04 am to Dr. Karma Greaser , who verbally acknowledged these  results. IMPRESSION: Positive for rather extensive occlusive thrombus in the left lower extremity from the common femoral vein in the groin distally through the distal femoral vein. Electronically Signed   By: Threasa Beards  Ehinger M.D.   On: 05/13/2017 00:07      Assessment/Plan 1. DVT left lower extremity:  The present time patient is tolerating anticoagulation. He denies pain he has minimal swelling. Therefore I do not find any indication for thrombolysis. There is no indication for IVC filter placement. The risks and benefit of both of these procedures was reviewed in detail. The patient agrees with continued anticoagulation and conservative therapy. The importance of elevation and compression was also discussed. Furthermore, the importance of anticoagulation and preventing propagation and lethal pulmonary emboli was reviewed in detail. Specifically the merits of Ellik was and some of the newer anticoagulants over traditional Coumadin therapy was reviewed.  Patient will continue anticoagulation therapy he will follow up with me in 1-2 weeks. Follow-up ultrasounds will be obtained at approximately 6 months.   A total of 50 minutes was spent with this patient and greater than 50% was spent in counseling and coordination of care with the patient.  Discussion included the treatment options for vascular disease including indications for surgery and intervention.  Also discussed is the appropriate timing of treatment.  In addition medical therapy was discussed.     Hortencia Pilar, MD  05/13/2017 3:31 PM    This note was created with Dragon medical transcription system.  Any error is purely unintentional

## 2017-05-13 NOTE — Progress Notes (Signed)
ANTICOAGULATION CONSULT NOTE - follow up [place] for Eliquis Indication: DVT  Allergies  Allergen Reactions  . Latex Other (See Comments)  . Other Anaphylaxis  . Solifenacin Other (See Comments)  . Sulfa Antibiotics Anaphylaxis    Patient Measurements: Height: 6\' 1"  (185.4 cm) Weight: 219 lb 8 oz (99.6 kg) IBW/kg (Calculated) : 79.9 Heparin Dosing Weight: 97.5 kg  Vital Signs: Temp: 98 F (36.7 C) (07/08 1204) Temp Source: Oral (07/08 1204) BP: 126/77 (07/08 1204) Pulse Rate: 81 (07/08 1204)  Labs:  Recent Labs  05/12/17 2202 05/13/17 0036 05/13/17 0439 05/13/17 0905  HGB 15.2  --  14.7  --   HCT 43.3  --  41.8  --   PLT 129*  --  119*  --   APTT  --  30  --   --   LABPROT  --  12.9  --   --   INR  --  0.97  --   --   HEPARINUNFRC  --   --   --  0.51  CREATININE 1.30*  --  1.09  --     Estimated Creatinine Clearance: 76.1 mL/min (by C-G formula based on SCr of 1.09 mg/dL).   Medical History: Past Medical History:  Diagnosis Date  . BPH (benign prostatic hyperplasia)   . Cancer ([place])   . Kidney stone     Medications:  Scheduled:  . allopurinol  300 mg Oral Daily  . apixaban  10 mg Oral BID   Followed by  . [START ON 05/20/2017] apixaban  5 mg Oral BID  . aspirin EC  81 mg Oral Daily  . cholecalciferol  1,000 Units Oral Daily  . feeding supplement (ENSURE ENLIVE)  237 mL Oral Daily  . lisinopril  10 mg Oral Daily  . morphine  15 mg Oral Q12H  . multivitamin with minerals  1 tablet Oral Daily  . pantoprazole  40 mg Oral Daily  . venlafaxine XR  150 mg Oral Q breakfast   Assessment: Patient admitted for LLE swelling for 4 days. Over the past two days has gotten worse in the calf and up to the thigh. [place] of leg: extensive occlusive thrombus.  Goal of Therapy:  Heparin level 0.3-0.7 units/ml Monitor platelets by anticoagulation protocol: Yes   Plan:  Baseline labs ordered. Start heparin infusion at 1500 units/hr and will check  a HL @ 0900. Per heparin consult NO BOLUS; ONLY INFUSION PER DR. FORBACH.  7/8 HL@ 0905= 0.51. Will continue current drip rate and check confirmatory level in 8 hrs. Plt 119.  7/8 1213 Pharmacy consulted to dose Eliquis for DVT. Eliquis 10 mg po BID x 7 days followed by Eliquis 5 mg po BID. Stop Heparin when first dose of Eliquis given.   Cindee Mclester A. [place], [place].D., BCPS Clinical Pharmacist 05/13/2017

## 2017-05-15 DIAGNOSIS — I82402 Acute embolism and thrombosis of unspecified deep veins of left lower extremity: Secondary | ICD-10-CM | POA: Insufficient documentation

## 2017-05-21 NOTE — Discharge Summary (Signed)
[facility] [place] NAME: Ralph Marsh    MR#:  [id number]  DATE OF BIRTH:  May 09, 1946  DATE OF ADMISSION:  05/12/2017 ADMITTING PHYSICIAN: Harvie Bridge, DO  DATE OF DISCHARGE: 05/13/2017  3:55 PM  PRIMARY CARE PHYSICIAN: Marguerita Merles, MD    ADMISSION DIAGNOSIS:  Leg edema [R60.0] Acute deep vein thrombosis (DVT) of femoral vein of left lower extremity (HCC) [I82.412]  DISCHARGE DIAGNOSIS:  Active Problems:   Acute deep vein thrombosis (DVT) of femoral vein of left lower extremity ([place])   SECONDARY DIAGNOSIS:   Past Medical History:  Diagnosis Date  . BPH (benign prostatic hyperplasia)   . Cancer ([place])   . Kidney stone     HOSPITAL COURSE:   71 year old male with past medical history of hypertension, gout, COPD, suspected prostate cancer who presents to the hospital due to left lower extreme swelling and pain and noted to have an extensive DVT.  1. Left lower extremity DVT-this was the cause of patient's left lower extremity swelling redness and pain. -Patient's pain has significantly improved after treatment with heparin. -Patient was seen by vascular surgery and did not think that the patient needed any thrombectomy or any surgical intervention presently. He recommended anticoagulation and follow up with them as an outpatient. Patient was switched over from IV heparin and discharged on oral Eliquis. He was given a 30 day free card and [facility] his [facility]. -She is clinically not hypoxic or in any distress presently.  2. History of gout-no acute attack- he will continue allopurinol.  3. Chronic pain- he will continue MS Contin, oxycodone as needed..  4. Essential hypertension- he will continue lisinopril.  5. GERD- he will continue Protonix.  6. Depression- he will continue Effexor.  DISCHARGE CONDITIONS:   Stable.   CONSULTS OBTAINED:  Treatment Team:   Katha Cabal, MD  DRUG ALLERGIES:   Allergies  Allergen Reactions  . Latex Other (See Comments)  . Other Anaphylaxis  . Solifenacin Other (See Comments)  . Sulfa Antibiotics Anaphylaxis    DISCHARGE MEDICATIONS:   Allergies as of 05/13/2017      Reactions   Latex Other (See Comments)   Other Anaphylaxis   Solifenacin Other (See Comments)   Sulfa Antibiotics Anaphylaxis      Medication List    STOP taking these medications   aspirin 81 MG tablet     TAKE these medications   allopurinol 300 MG tablet Commonly known as:  ZYLOPRIM Take 300 mg by mouth daily.   apixaban 5 MG Tabs tablet Commonly known as:  ELIQUIS Take 10 mg PO BID X 21 days and then 5 mg PO BID.   Cholecalciferol 1000 units tablet Take 1,000 Units by mouth daily.   diclofenac sodium 1 % Gel Commonly known as:  VOLTAREN Apply 2 g topically as needed.   FISH OIL + D3 PO Take 1 capsule by mouth daily.   JUICE PLUS FIBRE Liqd Take 1 Dose by mouth daily.   lisinopril 10 MG tablet Commonly known as:  PRINIVIL,ZESTRIL Take 10 mg by mouth daily.   morphine 15 MG 12 hr tablet Commonly known as:  MS CONTIN Take 15 mg by mouth every 12 (twelve) hours.   multivitamin tablet Take 1 tablet by mouth daily.   pantoprazole 40 MG tablet Commonly known as:  PROTONIX Take 40 mg by mouth daily.   venlafaxine XR 150 MG 24 hr  capsule Commonly known as:  EFFEXOR-XR Take 150 mg by mouth daily with breakfast.         DISCHARGE INSTRUCTIONS:   DIET:  Cardiac diet  DISCHARGE CONDITION:  Stable  ACTIVITY:  Activity as tolerated  OXYGEN:  Home Oxygen: No.   Oxygen Delivery: room air  DISCHARGE LOCATION:  home   If you experience worsening of your admission symptoms, develop shortness of breath, life threatening emergency, suicidal or homicidal thoughts you must seek medical attention immediately by calling 911 or calling your MD immediately  if symptoms less severe.  You Must read  complete instructions/literature along with all the possible adverse reactions/side effects for all the Medicines you take and that have been prescribed to you. Take any new Medicines after you have completely understood and accpet all the possible adverse reactions/side effects.   Please note  You were cared for by a hospitalist during your hospital stay. If you have any questions about your discharge medications or the care you received while you were in the hospital after you are discharged, you can call the unit and asked to speak with the hospitalist on call if the hospitalist that took care of you is not available. Once you are discharged, your primary care physician will handle any further medical issues. Please note that NO REFILLS for any discharge medications will be authorized once you are discharged, as it is imperative that you return to your primary care physician (or establish a relationship with a primary care physician if you do not have one) for your aftercare needs so that they can reassess your need for medications and monitor your lab values.   DATA REVIEW:   CBC No results for input(s): WBC, HGB, HCT, PLT in the last 168 hours.  Chemistries  No results for input(s): NA, K, CL, CO2, GLUCOSE, BUN, CREATININE, CALCIUM, MG, AST, ALT, ALKPHOS, BILITOT in the last 168 hours.  Invalid input(s): GFRCGP  Cardiac Enzymes No results for input(s): TROPONINI in the last 168 hours.    RADIOLOGY:  No results found.    Management plans discussed with the patient, family and they are in agreement.  CODE STATUS:  Code Status History    Date Active Date Inactive Code Status Order ID Comments User Context   05/13/2017  2:01 AM 05/13/2017  6:56 PM Full Code [id number]  Harvie Bridge, DO Inpatient   01/20/2017  5:41 AM 01/21/2017  5:06 PM Full Code [id number]  Saundra Shelling, MD Inpatient    Advance Directive Documentation     Most Recent Value  Type of Advance Directive  Healthcare  Power of Attorney  Pre-existing out of facility DNR order (yellow form or pink MOST form)  -  "MOST" Form in Place?  -      TOTAL TIME TAKING CARE OF THIS PATIENT: 40 minutes.    Henreitta Leber M.D on 05/21/2017 at 4:22 PM  Between 7am to 6pm - Pager - [phone]  After 6pm go to [email or web address] - password [email or web address] [facility]  Office  [phone]  CC: Primary care physician; Marguerita Merles, MD

## 2017-05-22 ENCOUNTER — Telehealth (INDEPENDENT_AMBULATORY_CARE_PROVIDER_SITE_OTHER): Payer: Self-pay | Admitting: Vascular Surgery

## 2017-05-22 NOTE — Telephone Encounter (Signed)
[facility] THAT HE WAS A VETRAN. HE WENT TO SEE HIS PCP AND HIS PCP IS CONCERNED ABOUT THE DOSAG OF ELIQUIS AND HOW LONG HE HAS BEEN ON THIS MEDICINE

## 2017-05-22 NOTE — Telephone Encounter (Signed)
I spoke with the patient and said the [place] was concerned about the dosage for Eliquis 10mg  bid for 21 days was to much and he should have his dosage reduced to 5mg  bid after 7 days.The patient also stated the pcp and pharmacist from the [place] had spoken to him about the medication.I spoke with JD and stated that the patient could start taking Eliquis 5mg  bid.

## 2017-06-04 ENCOUNTER — Ambulatory Visit (INDEPENDENT_AMBULATORY_CARE_PROVIDER_SITE_OTHER): Payer: Medicare PPO | Admitting: Vascular Surgery

## 2017-06-04 ENCOUNTER — Encounter (INDEPENDENT_AMBULATORY_CARE_PROVIDER_SITE_OTHER): Payer: Self-pay | Admitting: Vascular Surgery

## 2017-06-04 VITALS — BP 122/75 | HR 81 | Resp 16 | Ht 73.0 in | Wt 219.0 lb

## 2017-06-04 DIAGNOSIS — E785 Hyperlipidemia, unspecified: Secondary | ICD-10-CM | POA: Insufficient documentation

## 2017-06-04 DIAGNOSIS — K219 Gastro-esophageal reflux disease without esophagitis: Secondary | ICD-10-CM | POA: Diagnosis not present

## 2017-06-04 DIAGNOSIS — E784 Other hyperlipidemia: Secondary | ICD-10-CM

## 2017-06-04 DIAGNOSIS — I82412 Acute embolism and thrombosis of left femoral vein: Secondary | ICD-10-CM | POA: Diagnosis not present

## 2017-06-04 DIAGNOSIS — E7849 Other hyperlipidemia: Secondary | ICD-10-CM

## 2017-06-04 DIAGNOSIS — I10 Essential (primary) hypertension: Secondary | ICD-10-CM | POA: Diagnosis not present

## 2017-06-04 NOTE — Progress Notes (Signed)
MRN : [id number]  Ralph Marsh is a 71 y.o. (Jul 29, 1946) male who presents with chief complaint of  Chief Complaint  Patient presents with  . New Evaluation    consult  .  History of Present Illness:  The patient presents to the office for evaluation of DVT.  DVT was identified at [facility] by Duplex ultrasound.  The initial symptoms were pain and swelling in the lower extremity.  The patient notes the leg continues to be very painful with dependency and swells quite a bite.  Symptoms are much better with elevation.  The patient notes minimal edema in the morning which steadily worsens throughout the day.    The patient has not been using compression therapy at this point.  No SOB or pleuritic chest pains.  No cough or hemoptysis.  No blood per rectum or blood in any sputum.  No excessive bruising per the patient.    Current Meds  Medication Sig  . allopurinol (ZYLOPRIM) 300 MG tablet Take 300 mg by mouth daily.  Marland Kitchen apixaban (ELIQUIS) 5 MG TABS tablet Take 10 mg PO BID X 21 days and then 5 mg PO BID.  Marland Kitchen Cholecalciferol 1000 UNITS tablet Take 1,000 Units by mouth daily.  . diclofenac sodium (VOLTAREN) 1 % GEL Apply 2 g topically as needed.  . Fish Oil-Cholecalciferol (FISH OIL + D3 PO) Take 1 capsule by mouth daily.   Marland Kitchen lisinopril (PRINIVIL,ZESTRIL) 10 MG tablet Take 10 mg by mouth daily.  Marland Kitchen morphine (MS CONTIN) 15 MG 12 hr tablet Take 15 mg by mouth every 12 (twelve) hours.  . Multiple Vitamin (MULTIVITAMIN) tablet Take 1 tablet by mouth daily.  . Nutritional Supplements (JUICE PLUS FIBRE) LIQD Take 1 Dose by mouth daily.  . pantoprazole (PROTONIX) 40 MG tablet Take 40 mg by mouth daily.  Marland Kitchen venlafaxine XR (EFFEXOR-XR) 150 MG 24 hr capsule Take 150 mg by mouth daily with breakfast.    Past Medical History:  Diagnosis Date  . BPH (benign prostatic hyperplasia)   . Cancer ([place])   . Kidney stone     Past Surgical History:  Procedure Laterality Date  . EXTRACORPOREAL SHOCK WAVE  LITHOTRIPSY Left 01/25/2017   Procedure: EXTRACORPOREAL SHOCK WAVE LITHOTRIPSY (ESWL);  Surgeon: Nickie Retort, MD;  Location: [facility] ORS;  Service: Urology;  Laterality: Left;  . LASER OF PROSTATE W/ GREEN LIGHT PVP  2008   Dr. Quillian Quince  . TONSILLECTOMY AND ADENOIDECTOMY    . URETEROSCOPY WITH HOLMIUM LASER LITHOTRIPSY  2004    Social History Social History  Substance Use Topics  . Smoking status: Former Research scientist (life sciences)  . Smokeless tobacco: Never Used  . Alcohol use 4.2 oz/week    7 Standard drinks or equivalent per week    Family History Family History  Problem Relation Age of Onset  . Kidney disease Neg Hx   . Kidney cancer Neg Hx   . Prostate cancer Neg Hx     Allergies  Allergen Reactions  . Latex Other (See Comments)  . Other Anaphylaxis  . Solifenacin Other (See Comments)  . Sulfa Antibiotics Anaphylaxis     REVIEW OF SYSTEMS (Negative unless checked)  Constitutional: [] Weight loss  [] Fever  [] Chills Cardiac: [] Chest pain   [] Chest pressure   [] Palpitations   [] Shortness of breath when laying flat   [] Shortness of breath with exertion. Vascular:  [] Pain in legs with walking   [x] Pain in legs with standing  [] History of DVT   [] Phlebitis   [x] Swelling  in legs   [] Varicose veins   [] Non-healing ulcers Pulmonary:   [] Uses home oxygen   [] Productive cough   [] Hemoptysis   [] Wheeze  [] COPD   [] Asthma Neurologic:  [] Dizziness   [] Seizures   [] History of stroke   [] History of TIA  [] Aphasia   [] Vissual changes   [] Weakness or numbness in arm   [] Weakness or numbness in leg Musculoskeletal:   [] Joint swelling   [x] Joint pain   [] Low back pain Hematologic:  [] Easy bruising  [] Easy bleeding   [] Hypercoagulable state   [] Anemic Gastrointestinal:  [] Diarrhea   [] Vomiting  [] Gastroesophageal reflux/heartburn   [] Difficulty swallowing. Genitourinary:  [] Chronic kidney disease   [] Difficult urination  [] Frequent urination   [] Blood in urine Skin:  [] Rashes   [] Ulcers  Psychological:   [] History of anxiety   []  History of major depression.  Physical Examination  Vitals:   06/04/17 1336  BP: 122/75  Pulse: 81  Resp: 16  Weight: 219 lb (99.3 kg)  Height: 6\' 1"  (1.854 m)   Body mass index is 28.89 kg/m. Gen: WD/WN, NAD Head: [place]/AT, No temporalis wasting.  Ear/Nose/Throat: Hearing grossly intact, nares w/o erythema or drainage Eyes: PER, EOMI, sclera nonicteric.  Neck: Supple, no large masses.   Pulmonary:  Good air movement, no audible wheezing bilaterally, no use of accessory muscles.  Cardiac: RRR, no JVD Vascular: Mild venous stasis changes to the legs bilaterally.  2+ soft pitting edema Vessel Right Left  Radial Palpable Palpable  PT Palpable Palpable  DP Palpable Palpable  Gastrointestinal: Non-distended. No guarding/no peritoneal signs.  Musculoskeletal: M/S 5/5 throughout.  No deformity or atrophy.  Neurologic: CN 2-12 intact. Symmetrical.  Speech is fluent. Motor exam as listed above. Psychiatric: Judgment intact, Mood & affect appropriate for pt's clinical situation. Dermatologic: No rashes or ulcers noted.  No changes consistent with cellulitis. Lymph : No lichenification or skin changes of chronic lymphedema.  CBC Lab Results  Component Value Date   WBC 9.1 05/13/2017   HGB 14.7 05/13/2017   HCT 41.8 05/13/2017   MCV 92.5 05/13/2017   PLT 119 (L) 05/13/2017    BMET    Component Value Date/Time   NA 140 05/13/2017 0439   K 3.3 (L) 05/13/2017 0439   CL 106 05/13/2017 0439   CO2 27 05/13/2017 0439   GLUCOSE 143 (H) 05/13/2017 0439   BUN 21 (H) 05/13/2017 0439   CREATININE 1.09 05/13/2017 0439   CALCIUM 8.6 (L) 05/13/2017 0439   GFRNONAA >60 05/13/2017 0439   GFRAA >60 05/13/2017 0439   CrCl cannot be calculated (Patient's most recent lab result is older than the maximum 21 days allowed.).  COAG Lab Results  Component Value Date   INR 0.97 05/13/2017    Radiology US Venous Img Lower Unilateral Left  Result Date:  05/13/2017 CLINICAL DATA:  Left leg pain and swelling for 3 days. EXAM: LEFT LOWER EXTREMITY VENOUS DOPPLER ULTRASOUND TECHNIQUE: Gray-scale sonography with graded compression, as well as color Doppler and duplex ultrasound were performed to evaluate the lower extremity deep venous systems from the level of the common femoral vein and including the common femoral, femoral, profunda femoral, popliteal and calf veins including the posterior tibial, peroneal and gastrocnemius veins when visible. The superficial great saphenous vein was also interrogated. Spectral Doppler was utilized to evaluate flow at rest and with distal augmentation maneuvers in the common femoral, femoral and popliteal veins. COMPARISON:  None. FINDINGS: Contralateral Common Femoral Vein: Respiratory phasicity is normal and symmetric with the  symptomatic side. No evidence of thrombus. Normal compressibility. Common Femoral Vein: Occlusive thrombus. No compressibility or flow. Saphenofemoral Junction: Occlusive thrombus, no compressibility or flow. Profunda Femoral Vein: Occlusive thrombus, no compressibility or flow. Femoral Vein: Occlusive thrombus in the proximal and midportion, no compressibility or flow. Some flow seen distally. Popliteal Vein: No evidence of thrombus. Normal compressibility, respiratory phasicity and response to augmentation. Calf Veins: No evidence of thrombus. Normal compressibility and flow on color Doppler imaging. Superficial Great Saphenous Vein: Thrombus at the saphenous femoral junction. Venous Reflux:  None. Other Findings:  None. Critical Value/emergent results were called by telephone at the time of interpretation on 05/13/2017 at 12:04 am to Dr. Karma Greaser , who verbally acknowledged these results. IMPRESSION: Positive for rather extensive occlusive thrombus in the left lower extremity from the common femoral vein in the groin distally through the distal femoral vein. Electronically Signed   By: Jeb Levering M.D.    On: 05/13/2017 00:07    Assessment/Plan 1. Acute deep vein thrombosis (DVT) of femoral vein of left lower extremity (HCC) Recommend:   No surgery or intervention at this point in time.  IVC filter is not indicated at present.  Patient's duplex ultrasound of the venous system shows DVT from the popliteal to the femoral veins.  The patient is initiated on anticoagulation   Elevation was stressed, use of a recliner was discussed.  I have had a long discussion with the patient regarding DVT and post phlebitic changes such as swelling and why it  causes symptoms such as pain.  The patient will wear graduated compression stockings class 1 (20-30 mmHg), beginning after three full days of anticoagulation, on a daily basis a prescription was given. The patient will  beginning wearing the stockings first thing in the morning and removing them in the evening. The patient is instructed specifically not to sleep in the stockings.  In addition, behavioral modification including elevation during the day and avoidance of prolonged dependency will be initiated.    The patient will continue anticoagulation for now as there have not been any problems or complications at this point.    2. Essential hypertension Continue antihypertensive medications as already ordered, these medications have been reviewed and there are no changes at this time.   3. Gastroesophageal reflux disease without esophagitis Continue PPI as already ordered, these medications have been reviewed and there are no changes at this time.   4. Other hyperlipidemia Continue statin as ordered and reviewed, no changes at this time     Hortencia Pilar, MD  06/04/2017 1:55 PM

## 2017-08-09 ENCOUNTER — Ambulatory Visit: Payer: Medicare PPO

## 2017-11-12 ENCOUNTER — Emergency Department
Admission: EM | Admit: 2017-11-12 | Discharge: 2017-11-12 | Disposition: A | Discharge status: Home / Self Care | Payer: Medicare HMO | Attending: Emergency Medicine | Admitting: Emergency Medicine

## 2017-11-12 ENCOUNTER — Other Ambulatory Visit: Payer: Self-pay

## 2017-11-12 ENCOUNTER — Encounter: Payer: Self-pay | Admitting: *Deleted

## 2017-11-12 DIAGNOSIS — R350 Frequency of micturition: Secondary | ICD-10-CM | POA: Insufficient documentation

## 2017-11-12 DIAGNOSIS — R631 Polydipsia: Secondary | ICD-10-CM | POA: Insufficient documentation

## 2017-11-12 DIAGNOSIS — Z87891 Personal history of nicotine dependence: Secondary | ICD-10-CM | POA: Insufficient documentation

## 2017-11-12 DIAGNOSIS — Z79899 Other long term (current) drug therapy: Secondary | ICD-10-CM | POA: Diagnosis not present

## 2017-11-12 DIAGNOSIS — E119 Type 2 diabetes mellitus without complications: Secondary | ICD-10-CM

## 2017-11-12 DIAGNOSIS — I10 Essential (primary) hypertension: Secondary | ICD-10-CM | POA: Insufficient documentation

## 2017-11-12 DIAGNOSIS — R42 Dizziness and giddiness: Secondary | ICD-10-CM | POA: Diagnosis not present

## 2017-11-12 DIAGNOSIS — H538 Other visual disturbances: Secondary | ICD-10-CM | POA: Diagnosis not present

## 2017-11-12 DIAGNOSIS — Z7901 Long term (current) use of anticoagulants: Secondary | ICD-10-CM | POA: Insufficient documentation

## 2017-11-12 LAB — URINALYSIS, COMPLETE (UACMP) WITH MICROSCOPIC
Bacteria, UA: NONE SEEN
Bilirubin Urine: NEGATIVE
Glucose, UA: >=500 mg/dL — AB
Ketones, ur: 5 mg/dL — AB
Leukocytes, UA: NEGATIVE
NITRITE: NEGATIVE
PH: 5 (ref 5.0–8.0)
PROTEIN: NEGATIVE mg/dL
SPECIFIC GRAVITY, URINE: 1.03 (ref 1.005–1.030)
Squamous Epithelial / LPF: NONE SEEN

## 2017-11-12 LAB — CBC
HCT: 48.8 % (ref 40.0–52.0)
HEMOGLOBIN: 17.6 g/dL (ref 13.0–18.0)
MCH: 34.1 pg — AB (ref 26.0–34.0)
MCHC: 36.2 g/dL — AB (ref 32.0–36.0)
MCV: 94.3 fL (ref 80.0–100.0)
PLATELETS: 170 10*3/uL (ref 150–440)
RBC: 5.17 MIL/uL (ref 4.40–5.90)
RDW: 13 % (ref 11.5–14.5)
WBC: 7.3 10*3/uL (ref 3.8–10.6)

## 2017-11-12 LAB — BASIC METABOLIC PANEL
ANION GAP: 10 (ref 5–15)
BUN: 26 mg/dL — AB (ref 6–20)
CALCIUM: 9.1 mg/dL (ref 8.9–10.3)
CO2: 23 mmol/L (ref 22–32)
CREATININE: 1.41 mg/dL — AB (ref 0.61–1.24)
Chloride: 101 mmol/L (ref 101–111)
GFR calc Af Amer: 56 mL/min — ABNORMAL LOW (ref >60)
GFR, EST NON AFRICAN AMERICAN: 48 mL/min — AB (ref >60)
GLUCOSE: 465 mg/dL — AB (ref 65–99)
Potassium: 4.5 mmol/L (ref 3.5–5.1)
Sodium: 134 mmol/L — ABNORMAL LOW (ref 135–145)

## 2017-11-12 LAB — GLUCOSE, CAPILLARY
GLUCOSE-CAPILLARY: 361 mg/dL — AB (ref 65–99)
GLUCOSE-CAPILLARY: 399 mg/dL — AB (ref 65–99)
Glucose-Capillary: 485 mg/dL — ABNORMAL HIGH (ref 65–99)

## 2017-11-12 LAB — APTT: aPTT: 29 seconds (ref 24–36)

## 2017-11-12 MED ORDER — METFORMIN HCL 500 MG PO TABS: 500.0000 mg | ORAL_TABLET | Freq: Two times a day (BID) | ORAL | 11 refills | Status: DC

## 2017-11-12 MED ORDER — METFORMIN HCL 500 MG PO TABS
500.0000 mg | ORAL_TABLET | Freq: Once | ORAL | Status: AC
  Administered 2017-11-12: 500 mg via ORAL

## 2017-11-12 MED ORDER — METFORMIN HCL 500 MG PO TABS
ORAL_TABLET | ORAL | Status: AC
  Filled 2017-11-12: qty 1

## 2017-11-12 NOTE — ED Notes (Signed)
fsbs 485

## 2017-11-12 NOTE — ED Notes (Signed)
Pt signed esignature. 

## 2017-11-12 NOTE — ED Triage Notes (Addendum)
Pt ambulatory to triage.  Pt reports a fingerstick of 438 at 1500 today.  Pt is not a diabetic.  Pt states he has a dry mouth and thirst with frequent urination.  Pt alert.  Speech clear. Hx dvt pt taking eliquis for 6 months.

## 2017-11-12 NOTE — ED Notes (Signed)
Lab called and said chloride level incorrect and they are doing test again.  Dr Corky Downs aware.

## 2017-11-12 NOTE — ED Provider Notes (Signed)
[facility] Emergency Department Provider Note   ____________________________________________    I have reviewed the triage vital signs and the nursing notes.   HISTORY  Chief Complaint Hyperglycemia     HPI Ralph Marsh is a 72 y.o. male who presents with elevated blood glucose, blurry vision.  Patient reports that his physicians at the [facility] feel he does not have diabetes but his PCP feels that he does have diabetes.  Hence he has been checking his sugar, today it was over 400.  He reports mild blurry vision and mild dizziness.  Does report polyuria and polydipsia.  He suspects that being on Eliquis for DVT is increasing his sugar as well.  No nausea or vomiting.  No abdominal pain, no chest pain or pleurisy   Past Medical History:  Diagnosis Date  . BPH (benign prostatic hyperplasia)   . Cancer ([place])   . Kidney stone     Patient Active Problem List   Diagnosis Date Noted  . Essential hypertension 06/04/2017  . GERD (gastroesophageal reflux disease) 06/04/2017  . Hyperlipidemia 06/04/2017  . Acute deep vein thrombosis (DVT) of femoral vein of left lower extremity ([place]) 05/13/2017  . Obstructive uropathy 01/20/2017    Past Surgical History:  Procedure Laterality Date  . EXTRACORPOREAL SHOCK WAVE LITHOTRIPSY Left 01/25/2017   Procedure: EXTRACORPOREAL SHOCK WAVE LITHOTRIPSY (ESWL);  Surgeon: Nickie Retort, MD;  Location: [facility] ORS;  Service: Urology;  Laterality: Left;  . LASER OF PROSTATE W/ GREEN LIGHT PVP  2008   Dr. Quillian Quince  . TONSILLECTOMY AND ADENOIDECTOMY    . URETEROSCOPY WITH HOLMIUM LASER LITHOTRIPSY  2004    Prior to Admission medications   Medication Sig Start Date End Date Taking? Authorizing Provider  allopurinol (ZYLOPRIM) 300 MG tablet Take 300 mg by mouth daily.    [provider]  apixaban (ELIQUIS) 5 MG TABS tablet Take 10 mg PO BID X 21 days and then 5 mg PO BID. 05/13/17   Henreitta Leber, MD    Cholecalciferol 1000 UNITS tablet Take 1,000 Units by mouth daily.    [provider]  diclofenac sodium (VOLTAREN) 1 % GEL Apply 2 g topically as needed.    [provider]  Fish Oil-Cholecalciferol (FISH OIL + D3 PO) Take 1 capsule by mouth daily.     [provider]  lisinopril (PRINIVIL,ZESTRIL) 10 MG tablet Take 10 mg by mouth daily.    [provider]  metFORMIN (GLUCOPHAGE) 500 MG tablet Take 1 tablet (500 mg total) by mouth 2 (two) times daily with a meal. 11/12/17 11/12/18  Lavonia Drafts, MD  morphine (MS CONTIN) 15 MG 12 hr tablet Take 15 mg by mouth every 12 (twelve) hours.    [provider]  Multiple Vitamin (MULTIVITAMIN) tablet Take 1 tablet by mouth daily.    [provider]  Nutritional Supplements (JUICE PLUS FIBRE) LIQD Take 1 Dose by mouth daily.    [provider]  pantoprazole (PROTONIX) 40 MG tablet Take 40 mg by mouth daily.    [provider]  venlafaxine XR (EFFEXOR-XR) 150 MG 24 hr capsule Take 150 mg by mouth daily with breakfast.    [provider]     Allergies Latex; Other; Solifenacin; and Sulfa antibiotics  Family History  Problem Relation Age of Onset  . Kidney disease Neg Hx   . Kidney cancer Neg Hx   . Prostate cancer Neg Hx     Social History Social History  Tobacco Use  . Smoking status: Former Research scientist (life sciences)  . Smokeless tobacco: Never Used  Substance Use Topics  . Alcohol use: Yes    Alcohol/week: 4.2 oz    Types: 7 Standard drinks or equivalent per week  . Drug use: No    Review of Systems  Constitutional: Mild dizziness Eyes: Mild blurry vision ENT: Polydipsia Cardiovascular: Denies chest pain. Respiratory: Denies shortness of breath. Gastrointestinal: No abdominal pain.  No nausea, no vomiting.   Genitourinary: Polyuria, no dysuria Musculoskeletal: Negative for back pain. Skin: Negative for rash. Neurological: Negative for headaches     ____________________________________________   PHYSICAL EXAM:  VITAL SIGNS: ED Triage Vitals  Enc Vitals Group     BP 11/12/17 1816 136/84     Pulse Rate 11/12/17 1816 (!) 108     Resp 11/12/17 1816 20     Temp 11/12/17 1816 98.6 F (37 C)     Temp Source 11/12/17 1816 Oral     SpO2 11/12/17 1816 93 %     Weight 11/12/17 1818 94.8 kg (209 lb)     Height 11/12/17 1818 1.854 m (6\' 1" )     Head Circumference --      Peak Flow --      Pain Score --      Pain Loc --      Pain Edu? --      Excl. in [place]? --     Constitutional: Alert and oriented. No acute distress. Pleasant and interactive Eyes: Conjunctivae are normal.   Nose: No congestion/rhinnorhea. Mouth/Throat: Mucous membranes are moist.    Cardiovascular: Normal rate, regular rhythm. Grossly normal heart sounds.  Good peripheral circulation. Respiratory: Normal respiratory effort.  No retractions Gastrointestinal: Soft and nontender. No distention.   Genitourinary: deferred Musculoskeletal: No lower extremity tenderness nor edema.  Warm and well perfused Neurologic:  Normal speech and language. No gross focal neurologic deficits are appreciated.  Skin:  Skin is warm, dry and intact. No rash noted. Psychiatric: Mood and affect are normal. Speech and behavior are normal.  ____________________________________________   LABS (all labs ordered are listed, but only abnormal results are displayed)  Labs Reviewed  BASIC METABOLIC PANEL - Abnormal; Notable for the following components:      Result Value   Sodium 134 (*)    Glucose, Bld 465 (*)    BUN 26 (*)    Creatinine, Ser 1.41 (*)    GFR calc non Af Amer 48 (*)    GFR calc Af Amer 56 (*)    All other components within normal limits  CBC - Abnormal; Notable for the following components:   MCH 34.1 (*)    MCHC 36.2 (*)    All other components within normal limits  URINALYSIS, COMPLETE (UACMP) WITH MICROSCOPIC - Abnormal; Notable for the following components:    Color, Urine YELLOW (*)    APPearance CLEAR (*)    Glucose, UA >=500 (*)    Hgb urine dipstick SMALL (*)    Ketones, ur 5 (*)    All other components within normal limits  GLUCOSE, CAPILLARY - Abnormal; Notable for the following components:   Glucose-Capillary 485 (*)    All other components within normal limits  GLUCOSE, CAPILLARY - Abnormal; Notable for the following components:   Glucose-Capillary 399 (*)    All other components within normal limits  GLUCOSE, CAPILLARY - Abnormal; Notable for the following components:   Glucose-Capillary 361 (*)    All other components within normal limits  APTT  CBG MONITORING, ED  CBG MONITORING, ED   ____________________________________________  EKG  None ____________________________________________  RADIOLOGY  None ____________________________________________   PROCEDURES  Procedure(s) performed: No  Procedures   Critical Care performed:No ____________________________________________   INITIAL IMPRESSION / ASSESSMENT AND PLAN / ED COURSE  Pertinent labs & imaging results that were available during my care of the patient were reviewed by me and considered in my medical decision making (see chart for details).  Patient well-appearing in no acute distress.  Fingerstick blood glucoses elevated above 400, BMP confirms this.  Will give IV fluids check additional labs and likely start on p.o metformin  Glucose improved after 2 l iv fluids. Patient feels well, no dizziness. Will follow up closely with PCP. 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    Lavonia Drafts, MD 11/12/17 2208

## 2017-11-12 NOTE — ED Notes (Addendum)
Increased thirstand urination over the past few days, dizziness. Checked blood sugar at home and it was high. Pt states that he is not a diabetic. Pt alert and oriented X4, active, cooperative, pt in NAD. RR even and unlabored, color WNL.

## 2017-11-12 NOTE — ED Notes (Signed)
fsbs 361

## 2018-09-04 ENCOUNTER — Other Ambulatory Visit: Payer: Self-pay

## 2018-09-04 ENCOUNTER — Encounter: Payer: Self-pay | Admitting: *Deleted

## 2018-09-04 NOTE — Anesthesia Preprocedure Evaluation (Addendum)
Anesthesia Evaluation  Patient identified by MRN, date of birth, ID band Patient awake    Reviewed: Allergy & Precautions, NPO status , Patient's Chart, lab work & pertinent test results  History of Anesthesia Complications Negative for: history of anesthetic complications  Airway Mallampati: III  TM Distance: >3 FB Neck ROM: Full    Dental no notable dental hx.    Pulmonary COPD (emphysema), former smoker (quit 1995),    Pulmonary exam normal breath sounds clear to auscultation       Cardiovascular hypertension, + DVT (LE 05/2017; on ASA only)  Normal cardiovascular exam Rhythm:Regular Rate:Normal  Echo 12/06/16:  Normal Stress Echocardiogram NORMAL RIGHT VENTRICULAR SYSTOLIC FUNCTION TRIVIAL REGURGITATION NOTED  NO VALVULAR STENOSIS NOTED Resting EF: >55% (Est.) Post Stress EF:>55% (Est.) ECG Results: Normal   Neuro/Psych negative neurological ROS     GI/Hepatic GERD  ,Fatty liver   Endo/Other  diabetes, Type 2  Renal/GU Renal disease (hx nephrolithiasis)     Musculoskeletal  (+) Arthritis , Osteoarthritis,    Abdominal   Peds  Hematology negative hematology ROS (+)   Anesthesia Other Findings   Reproductive/Obstetrics                            Anesthesia Physical Anesthesia Plan  ASA: III  Anesthesia Plan: MAC   Post-op Pain Management:    Induction: Intravenous  PONV Risk Score and Plan: 1 and TIVA and Midazolam  Airway Management Planned: Natural Airway  Additional Equipment:   Intra-op Plan:   Post-operative Plan:   Informed Consent: I have reviewed the patients History and Physical, chart, labs and discussed the procedure including the risks, benefits and alternatives for the proposed anesthesia with the patient or authorized representative who has indicated his/her understanding and acceptance.     Plan Discussed with: CRNA  Anesthesia Plan Comments:         Anesthesia Quick Evaluation

## 2018-09-05 NOTE — Discharge Instructions (Signed)

## 2018-09-11 ENCOUNTER — Encounter
Admission: RE | Disposition: A | Discharge status: Home / Self Care | Payer: Self-pay | Source: Ambulatory Visit | Attending: Ophthalmology

## 2018-09-11 ENCOUNTER — Ambulatory Visit: Payer: Medicare HMO | Admitting: Anesthesiology

## 2018-09-11 ENCOUNTER — Ambulatory Visit
Admission: RE | Admit: 2018-09-11 | Discharge: 2018-09-11 | Disposition: A | Discharge status: Home / Self Care | Payer: Medicare HMO | Source: Ambulatory Visit | Attending: Ophthalmology | Admitting: Ophthalmology

## 2018-09-11 DIAGNOSIS — Z7982 Long term (current) use of aspirin: Secondary | ICD-10-CM | POA: Insufficient documentation

## 2018-09-11 DIAGNOSIS — Z87891 Personal history of nicotine dependence: Secondary | ICD-10-CM | POA: Diagnosis not present

## 2018-09-11 DIAGNOSIS — K219 Gastro-esophageal reflux disease without esophagitis: Secondary | ICD-10-CM | POA: Insufficient documentation

## 2018-09-11 DIAGNOSIS — E119 Type 2 diabetes mellitus without complications: Secondary | ICD-10-CM | POA: Diagnosis not present

## 2018-09-11 DIAGNOSIS — Z79899 Other long term (current) drug therapy: Secondary | ICD-10-CM | POA: Diagnosis not present

## 2018-09-11 DIAGNOSIS — F329 Major depressive disorder, single episode, unspecified: Secondary | ICD-10-CM | POA: Diagnosis not present

## 2018-09-11 DIAGNOSIS — Z7984 Long term (current) use of oral hypoglycemic drugs: Secondary | ICD-10-CM | POA: Insufficient documentation

## 2018-09-11 DIAGNOSIS — Z86718 Personal history of other venous thrombosis and embolism: Secondary | ICD-10-CM | POA: Insufficient documentation

## 2018-09-11 DIAGNOSIS — H2511 Age-related nuclear cataract, right eye: Secondary | ICD-10-CM | POA: Insufficient documentation

## 2018-09-11 HISTORY — DX: Acute embolism and thrombosis of unspecified deep veins of unspecified lower extremity: I82.409

## 2018-09-11 HISTORY — DX: Gastro-esophageal reflux disease without esophagitis: K21.9

## 2018-09-11 HISTORY — DX: Type 2 diabetes mellitus without complications: E11.9

## 2018-09-11 HISTORY — DX: Essential (primary) hypertension: I10

## 2018-09-11 HISTORY — DX: Fatty (change of) liver, not elsewhere classified: K76.0

## 2018-09-11 HISTORY — DX: Unspecified osteoarthritis, unspecified site: M19.90

## 2018-09-11 HISTORY — PX: CATARACT EXTRACTION W/PHACO: SHX586

## 2018-09-11 HISTORY — DX: Emphysema, unspecified: J43.9

## 2018-09-11 LAB — GLUCOSE, CAPILLARY
Glucose-Capillary: 166 mg/dL — ABNORMAL HIGH (ref 70–99)
Glucose-Capillary: 151 mg/dL — ABNORMAL HIGH (ref 70–99)

## 2018-09-11 SURGERY — PHACOEMULSIFICATION, CATARACT, WITH IOL INSERTION
Anesthesia: Monitor Anesthesia Care | Site: Eye | Laterality: Right

## 2018-09-11 MED ORDER — LIDOCAINE HCL (PF) 2 % IJ SOLN
INTRAOCULAR | Status: DC | PRN
  Administered 2018-09-11: 2 mL

## 2018-09-11 MED ORDER — MOXIFLOXACIN HCL 0.5 % OP SOLN
1.0000 [drp] | OPHTHALMIC | Status: DC | PRN
  Administered 2018-09-11 (×3): 1 [drp] via OPHTHALMIC

## 2018-09-11 MED ORDER — TETRACAINE HCL 0.5 % OP SOLN
1.0000 [drp] | OPHTHALMIC | Status: DC | PRN
  Administered 2018-09-11 (×2): 1 [drp] via OPHTHALMIC

## 2018-09-11 MED ORDER — LACTATED RINGERS IV SOLN: INTRAVENOUS | Status: DC

## 2018-09-11 MED ORDER — CEFUROXIME OPHTHALMIC INJECTION 1 MG/0.1 ML
INJECTION | OPHTHALMIC | Status: DC | PRN
  Administered 2018-09-11: 0.1 mL via INTRACAMERAL

## 2018-09-11 MED ORDER — FENTANYL CITRATE (PF) 100 MCG/2ML IJ SOLN
INTRAMUSCULAR | Status: DC | PRN
  Administered 2018-09-11: 50 ug via INTRAVENOUS

## 2018-09-11 MED ORDER — ACETAMINOPHEN 325 MG PO TABS: 650.0000 mg | ORAL_TABLET | Freq: Once | ORAL | Status: DC | PRN

## 2018-09-11 MED ORDER — BRIMONIDINE TARTRATE-TIMOLOL 0.2-0.5 % OP SOLN
OPHTHALMIC | Status: DC | PRN
  Administered 2018-09-11: 1 [drp] via OPHTHALMIC

## 2018-09-11 MED ORDER — NA HYALUR & NA CHOND-NA HYALUR 0.4-0.35 ML IO KIT
PACK | INTRAOCULAR | Status: DC | PRN
  Administered 2018-09-11: 1 mL via INTRAOCULAR

## 2018-09-11 MED ORDER — ACETAMINOPHEN 160 MG/5ML PO SOLN: 325.0000 mg | ORAL | Status: DC | PRN

## 2018-09-11 MED ORDER — MIDAZOLAM HCL 2 MG/2ML IJ SOLN
INTRAMUSCULAR | Status: DC | PRN
  Administered 2018-09-11: 2 mg via INTRAVENOUS

## 2018-09-11 MED ORDER — ONDANSETRON HCL 4 MG/2ML IJ SOLN: 4.0000 mg | Freq: Once | INTRAMUSCULAR | Status: DC | PRN

## 2018-09-11 MED ORDER — EPINEPHRINE PF 1 MG/ML IJ SOLN
INTRAOCULAR | Status: DC | PRN
  Administered 2018-09-11: 51 mL via OPHTHALMIC

## 2018-09-11 MED ORDER — ARMC OPHTHALMIC DILATING DROPS
1.0000 "application " | OPHTHALMIC | Status: DC | PRN
  Administered 2018-09-11 (×3): 1 via OPHTHALMIC

## 2018-09-11 SURGICAL SUPPLY — 20 items
CANNULA ANT/CHMB 27G (MISCELLANEOUS) ×1 IMPLANT
CANNULA ANT/CHMB 27GA (MISCELLANEOUS) ×3 IMPLANT
GLOVE SURG LX 7.5 STRW (GLOVE) ×2
GLOVE SURG LX STRL 7.5 STRW (GLOVE) ×1 IMPLANT
GLOVE SURG TRIUMPH 8.0 PF LTX (GLOVE) ×3 IMPLANT
GOWN STRL REUS W/ TWL LRG LVL3 (GOWN DISPOSABLE) ×2 IMPLANT
GOWN STRL REUS W/TWL LRG LVL3 (GOWN DISPOSABLE) ×4
LENS IOL TECNIS ITEC 23.0 (Intraocular Lens) ×2 IMPLANT
MARKER SKIN DUAL TIP RULER LAB (MISCELLANEOUS) ×3 IMPLANT
NDL FILTER BLUNT 18X1 1/2 (NEEDLE) ×1 IMPLANT
NEEDLE FILTER BLUNT 18X 1/2SAF (NEEDLE) ×2
NEEDLE FILTER BLUNT 18X1 1/2 (NEEDLE) ×1 IMPLANT
PACK CATARACT BRASINGTON (MISCELLANEOUS) ×3 IMPLANT
PACK EYE AFTER SURG (MISCELLANEOUS) ×3 IMPLANT
PACK OPTHALMIC (MISCELLANEOUS) ×3 IMPLANT
SYR 3ML LL SCALE MARK (SYRINGE) ×3 IMPLANT
SYR 5ML LL (SYRINGE) ×3 IMPLANT
SYR TB 1ML LUER SLIP (SYRINGE) ×3 IMPLANT
WATER STERILE IRR 500ML POUR (IV SOLUTION) ×3 IMPLANT
WIPE NON LINTING 3.25X3.25 (MISCELLANEOUS) ×3 IMPLANT

## 2018-09-11 NOTE — Transfer of Care (Signed)
Immediate Anesthesia Transfer of Care Note  Patient: Ralph Marsh  Procedure(s) Performed: CATARACT EXTRACTION PHACO AND INTRAOCULAR LENS PLACEMENT (IOC)  RIGHT DIABETIC (Right Eye)  Patient Location: PACU  Anesthesia Type: MAC  Level of Consciousness: awake, alert  and patient cooperative  Airway and Oxygen Therapy: Patient Spontanous Breathing and Patient connected to supplemental oxygen  Post-op Assessment: Post-op Vital signs reviewed, Patient's Cardiovascular Status Stable, Respiratory Function Stable, Patent Airway and No signs of Nausea or vomiting  Post-op Vital Signs: Reviewed and stable  Complications: No apparent anesthesia complications

## 2018-09-11 NOTE — Op Note (Signed)
LOCATION:  [place]   PREOPERATIVE DIAGNOSIS:    Nuclear sclerotic cataract right eye. H25.11   POSTOPERATIVE DIAGNOSIS:  Nuclear sclerotic cataract right eye.     PROCEDURE:  Phacoemusification with posterior chamber intraocular lens placement of the right eye   LENS:   Implant Name Type Inv. Item Serial No. Manufacturer Lot No. LRB No. Used  LENS IOL DIOP 23.0 - [id number] Intraocular Lens LENS IOL DIOP 23.0 [id number] AMO  Right 1        ULTRASOUND TIME: 24 % of 0 minutes, 55 seconds.  CDE 13.2   SURGEON:  Wyonia Hough, MD   ANESTHESIA:  Topical with tetracaine drops and 2% Xylocaine jelly, augmented with 1% preservative-free intracameral lidocaine.    COMPLICATIONS:  None.   DESCRIPTION OF PROCEDURE:  The patient was identified in the holding room and transported to the operating room and placed in the supine position under the operating microscope.  The right eye was identified as the operative eye and it was prepped and draped in the usual sterile ophthalmic fashion.   A 1 millimeter clear-corneal paracentesis was made at the 12:00 position.  0.5 ml of preservative-free 1% lidocaine was injected into the anterior chamber. The anterior chamber was filled with Viscoat viscoelastic.  A 2.4 millimeter keratome was used to make a near-clear corneal incision at the 9:00 position.  A curvilinear capsulorrhexis was made with a cystotome and capsulorrhexis forceps.  Balanced salt solution was used to hydrodissect and hydrodelineate the nucleus.   Phacoemulsification was then used in stop and chop fashion to remove the lens nucleus and epinucleus.  The remaining cortex was then removed using the irrigation and aspiration handpiece. Provisc was then placed into the capsular bag to distend it for lens placement.  A lens was then injected into the capsular bag.  The remaining viscoelastic was aspirated.   Wounds were hydrated with balanced salt solution.  The anterior  chamber was inflated to a physiologic pressure with balanced salt solution.  No wound leaks were noted. Cefuroxime 0.1 ml of a 10mg /ml solution was injected into the anterior chamber for a dose of 1 mg of intracameral antibiotic at the completion of the case.   Timolol and Brimonidine drops were applied to the eye.  The patient was taken to the recovery room in stable condition without complications of anesthesia or surgery.   Elly Haffey 09/11/2018, 8:47 AM

## 2018-09-11 NOTE — Anesthesia Procedure Notes (Signed)
Procedure Name: MAC Date/Time: 09/11/2018 8:35 AM Performed by: Jeannene Patella, CRNA Pre-anesthesia Checklist: Patient identified, Emergency Drugs available, Suction available, Patient being monitored and Timeout performed Patient Re-evaluated:Patient Re-evaluated prior to induction Oxygen Delivery Method: Nasal cannula Induction Type: IV induction

## 2018-09-11 NOTE — H&P (Signed)
The History and Physical notes are on paper, have been signed, and are to be scanned. The patient remains stable and unchanged from the H&P.   Previous H&P reviewed, patient examined, and there are no changes.  Ralph Marsh 09/11/2018 8:02 AM

## 2018-09-11 NOTE — Anesthesia Postprocedure Evaluation (Signed)
Anesthesia Post Note  Patient: Ralph Marsh  Procedure(s) Performed: CATARACT EXTRACTION PHACO AND INTRAOCULAR LENS PLACEMENT (IOC)  RIGHT DIABETIC (Right Eye)  Patient location during evaluation: PACU Anesthesia Type: MAC Level of consciousness: awake and alert, oriented and patient cooperative Pain management: pain level controlled Vital Signs Assessment: post-procedure vital signs reviewed and stable Respiratory status: spontaneous breathing, nonlabored ventilation and respiratory function stable Cardiovascular status: blood pressure returned to baseline and stable Postop Assessment: adequate PO intake Anesthetic complications: no    Darrin Nipper

## 2018-09-12 ENCOUNTER — Encounter: Payer: Self-pay | Admitting: Ophthalmology

## 2018-10-01 ENCOUNTER — Other Ambulatory Visit: Payer: Self-pay

## 2018-10-01 ENCOUNTER — Encounter: Payer: Self-pay | Admitting: *Deleted

## 2018-10-01 NOTE — Discharge Instructions (Signed)

## 2018-10-09 ENCOUNTER — Ambulatory Visit: Payer: Medicare HMO | Admitting: Anesthesiology

## 2018-10-09 ENCOUNTER — Ambulatory Visit
Admission: RE | Admit: 2018-10-09 | Discharge: 2018-10-09 | Disposition: A | Discharge status: Home / Self Care | Payer: Medicare HMO | Source: Ambulatory Visit | Attending: Ophthalmology | Admitting: Ophthalmology

## 2018-10-09 ENCOUNTER — Encounter
Admission: RE | Disposition: A | Discharge status: Home / Self Care | Payer: Self-pay | Source: Ambulatory Visit | Attending: Ophthalmology

## 2018-10-09 DIAGNOSIS — I10 Essential (primary) hypertension: Secondary | ICD-10-CM | POA: Diagnosis not present

## 2018-10-09 DIAGNOSIS — H2512 Age-related nuclear cataract, left eye: Secondary | ICD-10-CM | POA: Diagnosis not present

## 2018-10-09 DIAGNOSIS — Z79899 Other long term (current) drug therapy: Secondary | ICD-10-CM | POA: Diagnosis not present

## 2018-10-09 DIAGNOSIS — Z79891 Long term (current) use of opiate analgesic: Secondary | ICD-10-CM | POA: Insufficient documentation

## 2018-10-09 DIAGNOSIS — E1136 Type 2 diabetes mellitus with diabetic cataract: Secondary | ICD-10-CM | POA: Diagnosis not present

## 2018-10-09 DIAGNOSIS — M109 Gout, unspecified: Secondary | ICD-10-CM | POA: Diagnosis not present

## 2018-10-09 DIAGNOSIS — Z86718 Personal history of other venous thrombosis and embolism: Secondary | ICD-10-CM | POA: Diagnosis not present

## 2018-10-09 DIAGNOSIS — Z87891 Personal history of nicotine dependence: Secondary | ICD-10-CM | POA: Insufficient documentation

## 2018-10-09 DIAGNOSIS — J439 Emphysema, unspecified: Secondary | ICD-10-CM | POA: Insufficient documentation

## 2018-10-09 DIAGNOSIS — K219 Gastro-esophageal reflux disease without esophagitis: Secondary | ICD-10-CM | POA: Insufficient documentation

## 2018-10-09 DIAGNOSIS — Z7982 Long term (current) use of aspirin: Secondary | ICD-10-CM | POA: Diagnosis not present

## 2018-10-09 DIAGNOSIS — Z7984 Long term (current) use of oral hypoglycemic drugs: Secondary | ICD-10-CM | POA: Insufficient documentation

## 2018-10-09 HISTORY — PX: CATARACT EXTRACTION W/PHACO: SHX586

## 2018-10-09 LAB — GLUCOSE, CAPILLARY
Glucose-Capillary: 146 mg/dL — ABNORMAL HIGH (ref 70–99)
Glucose-Capillary: 159 mg/dL — ABNORMAL HIGH (ref 70–99)

## 2018-10-09 SURGERY — PHACOEMULSIFICATION, CATARACT, WITH IOL INSERTION
Anesthesia: Monitor Anesthesia Care | Site: Eye | Laterality: Left

## 2018-10-09 MED ORDER — CEFUROXIME OPHTHALMIC INJECTION 1 MG/0.1 ML
INJECTION | OPHTHALMIC | Status: DC | PRN
  Administered 2018-10-09: 0.1 mL via INTRACAMERAL

## 2018-10-09 MED ORDER — NA HYALUR & NA CHOND-NA HYALUR 0.4-0.35 ML IO KIT
PACK | INTRAOCULAR | Status: DC | PRN
  Administered 2018-10-09: 1 mL via INTRAOCULAR

## 2018-10-09 MED ORDER — MOXIFLOXACIN HCL 0.5 % OP SOLN
1.0000 [drp] | OPHTHALMIC | Status: DC | PRN
  Administered 2018-10-09 (×3): 1 [drp] via OPHTHALMIC

## 2018-10-09 MED ORDER — LACTATED RINGERS IV SOLN: INTRAVENOUS | Status: DC

## 2018-10-09 MED ORDER — EPINEPHRINE PF 1 MG/ML IJ SOLN
INTRAOCULAR | Status: DC | PRN
  Administered 2018-10-09: 64 mL via OPHTHALMIC

## 2018-10-09 MED ORDER — OXYCODONE HCL 5 MG PO TABS: 5.0000 mg | ORAL_TABLET | Freq: Once | ORAL | Status: DC | PRN

## 2018-10-09 MED ORDER — LIDOCAINE HCL (PF) 2 % IJ SOLN
INTRAOCULAR | Status: DC | PRN
  Administered 2018-10-09: 2 mL

## 2018-10-09 MED ORDER — ARMC OPHTHALMIC DILATING DROPS
1.0000 "application " | OPHTHALMIC | Status: DC | PRN
  Administered 2018-10-09 (×3): 1 via OPHTHALMIC

## 2018-10-09 MED ORDER — OXYCODONE HCL 5 MG/5ML PO SOLN: 5.0000 mg | Freq: Once | ORAL | Status: DC | PRN

## 2018-10-09 MED ORDER — MIDAZOLAM HCL 2 MG/2ML IJ SOLN
INTRAMUSCULAR | Status: DC | PRN
  Administered 2018-10-09: 1 mg via INTRAVENOUS

## 2018-10-09 MED ORDER — TETRACAINE HCL 0.5 % OP SOLN
1.0000 [drp] | OPHTHALMIC | Status: DC | PRN
  Administered 2018-10-09 (×2): 1 [drp] via OPHTHALMIC

## 2018-10-09 MED ORDER — FENTANYL CITRATE (PF) 100 MCG/2ML IJ SOLN
INTRAMUSCULAR | Status: DC | PRN
  Administered 2018-10-09: 50 ug via INTRAVENOUS

## 2018-10-09 MED ORDER — BRIMONIDINE TARTRATE-TIMOLOL 0.2-0.5 % OP SOLN
OPHTHALMIC | Status: DC | PRN
  Administered 2018-10-09: 1 [drp] via OPHTHALMIC

## 2018-10-09 SURGICAL SUPPLY — 20 items
CANNULA ANT/CHMB 27G (MISCELLANEOUS) ×1 IMPLANT
CANNULA ANT/CHMB 27GA (MISCELLANEOUS) ×3 IMPLANT
GLOVE SURG LX 7.5 STRW (GLOVE) ×2
GLOVE SURG LX STRL 7.5 STRW (GLOVE) ×1 IMPLANT
GLOVE SURG TRIUMPH 8.0 PF LTX (GLOVE) ×3 IMPLANT
GOWN STRL REUS W/ TWL LRG LVL3 (GOWN DISPOSABLE) ×2 IMPLANT
GOWN STRL REUS W/TWL LRG LVL3 (GOWN DISPOSABLE) ×4
LENS IOL TECNIS ITEC 22.0 (Intraocular Lens) ×2 IMPLANT
MARKER SKIN DUAL TIP RULER LAB (MISCELLANEOUS) ×3 IMPLANT
NDL FILTER BLUNT 18X1 1/2 (NEEDLE) ×1 IMPLANT
NEEDLE FILTER BLUNT 18X 1/2SAF (NEEDLE) ×2
NEEDLE FILTER BLUNT 18X1 1/2 (NEEDLE) ×1 IMPLANT
PACK CATARACT BRASINGTON (MISCELLANEOUS) ×3 IMPLANT
PACK EYE AFTER SURG (MISCELLANEOUS) ×3 IMPLANT
PACK OPTHALMIC (MISCELLANEOUS) ×3 IMPLANT
SYR 3ML LL SCALE MARK (SYRINGE) ×3 IMPLANT
SYR 5ML LL (SYRINGE) ×3 IMPLANT
SYR TB 1ML LUER SLIP (SYRINGE) ×3 IMPLANT
WATER STERILE IRR 500ML POUR (IV SOLUTION) ×3 IMPLANT
WIPE NON LINTING 3.25X3.25 (MISCELLANEOUS) ×3 IMPLANT

## 2018-10-09 NOTE — Transfer of Care (Signed)
Immediate Anesthesia Transfer of Care Note  Patient: Ralph Marsh  Procedure(s) Performed: CATARACT EXTRACTION PHACO AND INTRAOCULAR LENS PLACEMENT (IOC)  LEFT DIABETIC (Left Eye)  Patient Location: PACU  Anesthesia Type: MAC  Level of Consciousness: awake, alert  and patient cooperative  Airway and Oxygen Therapy: Patient Spontanous Breathing and Patient connected to supplemental oxygen  Post-op Assessment: Post-op Vital signs reviewed, Patient's Cardiovascular Status Stable, Respiratory Function Stable, Patent Airway and No signs of Nausea or vomiting  Post-op Vital Signs: Reviewed and stable  Complications: No apparent anesthesia complications

## 2018-10-09 NOTE — H&P (Signed)

## 2018-10-09 NOTE — Anesthesia Procedure Notes (Signed)
Procedure Name: MAC Date/Time: 10/09/2018 8:12 AM Performed by: Cameron Ali, CRNA Pre-anesthesia Checklist: Patient identified, Emergency Drugs available, Suction available, Timeout performed and Patient being monitored Patient Re-evaluated:Patient Re-evaluated prior to induction Oxygen Delivery Method: Nasal cannula Placement Confirmation: positive ETCO2

## 2018-10-09 NOTE — Op Note (Signed)
OPERATIVE NOTE  TRIG MCBRYAR [id number] 10/09/2018   PREOPERATIVE DIAGNOSIS:  Nuclear sclerotic cataract left eye. H25.12   POSTOPERATIVE DIAGNOSIS:    Nuclear sclerotic cataract left eye.     PROCEDURE:  Phacoemusification with posterior chamber intraocular lens placement of the left eye   LENS:   Implant Name Type Inv. Item Serial No. Manufacturer Lot No. LRB No. Used  LENS IOL DIOP 22.0 - [id number] Intraocular Lens LENS IOL DIOP 22.0 [id number]  Left 1       ULTRASOUND TIME: 19  % of 1 minutes9 seconds, CDE 13.3  SURGEON:  Wyonia Hough, MD   ANESTHESIA:  Topical with tetracaine drops and 2% Xylocaine jelly, augmented with 1% preservative-free intracameral lidocaine.    COMPLICATIONS:  None.   DESCRIPTION OF PROCEDURE:  The patient was identified in the holding room and transported to the operating room and placed in the supine position under the operating microscope.  The left eye was identified as the operative eye and it was prepped and draped in the usual sterile ophthalmic fashion.   A 1 millimeter clear-corneal paracentesis was made at the 1:30 position.  0.5 ml of preservative-free 1% lidocaine was injected into the anterior chamber.  The anterior chamber was filled with Viscoat viscoelastic.  A 2.4 millimeter keratome was used to make a near-clear corneal incision at the 10:30 position.  .  A curvilinear capsulorrhexis was made with a cystotome and capsulorrhexis forceps.  Balanced salt solution was used to hydrodissect and hydrodelineate the nucleus.   Phacoemulsification was then used in stop and chop fashion to remove the lens nucleus and epinucleus.  The remaining cortex was then removed using the irrigation and aspiration handpiece. Provisc was then placed into the capsular bag to distend it for lens placement.  A lens was then injected into the capsular bag.  The remaining viscoelastic was aspirated.   Wounds were hydrated with balanced salt  solution.  The anterior chamber was inflated to a physiologic pressure with balanced salt solution.  No wound leaks were noted. Cefuroxime 0.1 ml of a 10mg /ml solution was injected into the anterior chamber for a dose of 1 mg of intracameral antibiotic at the completion of the case.   Timolol and Brimonidine drops were applied to the eye.  The patient was taken to the recovery room in stable condition without complications of anesthesia or surgery.  Makail Watling 10/09/2018, 8:29 AM

## 2018-10-09 NOTE — Anesthesia Preprocedure Evaluation (Addendum)
Anesthesia Evaluation  Patient identified by MRN, date of birth, ID band Patient awake    Reviewed: Allergy & Precautions, H&P , NPO status , Patient's Chart, lab work & pertinent test results  Airway Mallampati: II  TM Distance: >3 FB Neck ROM: full    Dental no notable dental hx.    Pulmonary former smoker,    Pulmonary exam normal breath sounds clear to auscultation       Cardiovascular hypertension, Normal cardiovascular exam Rhythm:Regular Rate:Normal     Neuro/Psych    GI/Hepatic Medicated,  Endo/Other  diabetes, Well Controlled, Type 2  Renal/GU      Musculoskeletal   Abdominal   Peds  Hematology negative hematology ROS (+)   Anesthesia Other Findings   Reproductive/Obstetrics                            Anesthesia Physical Anesthesia Plan  ASA: II  Anesthesia Plan: MAC   Post-op Pain Management:    Induction:   PONV Risk Score and Plan:   Airway Management Planned:   Additional Equipment:   Intra-op Plan:   Post-operative Plan:   Informed Consent: I have reviewed the patients History and Physical, chart, labs and discussed the procedure including the risks, benefits and alternatives for the proposed anesthesia with the patient or authorized representative who has indicated his/her understanding and acceptance.     Plan Discussed with:   Anesthesia Plan Comments:         Anesthesia Quick Evaluation

## 2018-10-09 NOTE — Anesthesia Postprocedure Evaluation (Signed)
Anesthesia Post Note  Patient: Ralph Marsh  Procedure(s) Performed: CATARACT EXTRACTION PHACO AND INTRAOCULAR LENS PLACEMENT (IOC)  LEFT DIABETIC (Left Eye)  Patient location during evaluation: PACU Anesthesia Type: MAC Level of consciousness: awake and alert Pain management: pain level controlled Vital Signs Assessment: post-procedure vital signs reviewed and stable Respiratory status: spontaneous breathing Cardiovascular status: blood pressure returned to baseline Anesthetic complications: no    Jaci Standard, III,  Lavere Stork D

## 2018-10-10 ENCOUNTER — Encounter: Payer: Self-pay | Admitting: Ophthalmology

## 2019-02-12 ENCOUNTER — Encounter: Admission: RE | Payer: Self-pay | Source: Home / Self Care

## 2019-02-12 ENCOUNTER — Ambulatory Visit
Admission: RE | Admit: 2019-02-12 | Payer: Medicare HMO | Source: Home / Self Care | Admitting: Unknown Physician Specialty

## 2019-02-12 SURGERY — COLONOSCOPY WITH PROPOFOL
Anesthesia: General

## 2019-07-24 ENCOUNTER — Other Ambulatory Visit: Payer: Self-pay

## 2019-07-24 ENCOUNTER — Other Ambulatory Visit
Admission: RE | Admit: 2019-07-24 | Discharge: 2019-07-24 | Disposition: A | Discharge status: Home / Self Care | Payer: Medicare HMO | Source: Ambulatory Visit | Attending: Internal Medicine | Admitting: Internal Medicine

## 2019-07-24 DIAGNOSIS — Z20828 Contact with and (suspected) exposure to other viral communicable diseases: Secondary | ICD-10-CM | POA: Diagnosis not present

## 2019-07-24 DIAGNOSIS — Z01812 Encounter for preprocedural laboratory examination: Secondary | ICD-10-CM | POA: Insufficient documentation

## 2019-07-24 LAB — SARS CORONAVIRUS 2 (TAT 6-24 HRS): SARS Coronavirus 2: NEGATIVE

## 2019-07-27 ENCOUNTER — Encounter: Payer: Self-pay | Admitting: Anesthesiology

## 2019-07-28 ENCOUNTER — Encounter: Payer: Self-pay | Admitting: *Deleted

## 2019-07-28 ENCOUNTER — Ambulatory Visit: Payer: Medicare HMO | Admitting: Anesthesiology

## 2019-07-28 ENCOUNTER — Ambulatory Visit
Admission: RE | Admit: 2019-07-28 | Discharge: 2019-07-28 | Disposition: A | Discharge status: Home / Self Care | Payer: Medicare HMO | Attending: Internal Medicine | Admitting: Internal Medicine

## 2019-07-28 ENCOUNTER — Other Ambulatory Visit: Payer: Self-pay

## 2019-07-28 ENCOUNTER — Encounter
Admission: RE | Disposition: A | Discharge status: Home / Self Care | Payer: Self-pay | Source: Home / Self Care | Attending: Internal Medicine

## 2019-07-28 DIAGNOSIS — J439 Emphysema, unspecified: Secondary | ICD-10-CM | POA: Diagnosis not present

## 2019-07-28 DIAGNOSIS — M109 Gout, unspecified: Secondary | ICD-10-CM | POA: Insufficient documentation

## 2019-07-28 DIAGNOSIS — Z888 Allergy status to other drugs, medicaments and biological substances status: Secondary | ICD-10-CM | POA: Insufficient documentation

## 2019-07-28 DIAGNOSIS — Z86718 Personal history of other venous thrombosis and embolism: Secondary | ICD-10-CM | POA: Insufficient documentation

## 2019-07-28 DIAGNOSIS — K760 Fatty (change of) liver, not elsewhere classified: Secondary | ICD-10-CM | POA: Diagnosis not present

## 2019-07-28 DIAGNOSIS — K640 First degree hemorrhoids: Secondary | ICD-10-CM | POA: Insufficient documentation

## 2019-07-28 DIAGNOSIS — Z87891 Personal history of nicotine dependence: Secondary | ICD-10-CM | POA: Insufficient documentation

## 2019-07-28 DIAGNOSIS — N400 Enlarged prostate without lower urinary tract symptoms: Secondary | ICD-10-CM | POA: Diagnosis not present

## 2019-07-28 DIAGNOSIS — Z86010 Personal history of colonic polyps: Secondary | ICD-10-CM | POA: Diagnosis not present

## 2019-07-28 DIAGNOSIS — I10 Essential (primary) hypertension: Secondary | ICD-10-CM | POA: Diagnosis not present

## 2019-07-28 DIAGNOSIS — K5730 Diverticulosis of large intestine without perforation or abscess without bleeding: Secondary | ICD-10-CM | POA: Insufficient documentation

## 2019-07-28 DIAGNOSIS — Z7982 Long term (current) use of aspirin: Secondary | ICD-10-CM | POA: Diagnosis not present

## 2019-07-28 DIAGNOSIS — Z91040 Latex allergy status: Secondary | ICD-10-CM | POA: Insufficient documentation

## 2019-07-28 DIAGNOSIS — M1990 Unspecified osteoarthritis, unspecified site: Secondary | ICD-10-CM | POA: Insufficient documentation

## 2019-07-28 DIAGNOSIS — Z882 Allergy status to sulfonamides status: Secondary | ICD-10-CM | POA: Diagnosis not present

## 2019-07-28 DIAGNOSIS — E119 Type 2 diabetes mellitus without complications: Secondary | ICD-10-CM | POA: Diagnosis not present

## 2019-07-28 DIAGNOSIS — Z79899 Other long term (current) drug therapy: Secondary | ICD-10-CM | POA: Diagnosis not present

## 2019-07-28 DIAGNOSIS — K219 Gastro-esophageal reflux disease without esophagitis: Secondary | ICD-10-CM | POA: Diagnosis not present

## 2019-07-28 DIAGNOSIS — Z7984 Long term (current) use of oral hypoglycemic drugs: Secondary | ICD-10-CM | POA: Diagnosis not present

## 2019-07-28 HISTORY — PX: COLONOSCOPY WITH PROPOFOL: SHX5780

## 2019-07-28 LAB — GLUCOSE, CAPILLARY: Glucose-Capillary: 173 mg/dL — ABNORMAL HIGH (ref 70–99)

## 2019-07-28 SURGERY — COLONOSCOPY WITH PROPOFOL
Anesthesia: General

## 2019-07-28 MED ORDER — PROPOFOL 10 MG/ML IV BOLUS
INTRAVENOUS | Status: DC | PRN
  Administered 2019-07-28: 80 mg via INTRAVENOUS
  Administered 2019-07-28: 23 mg via INTRAVENOUS

## 2019-07-28 MED ORDER — LIDOCAINE HCL (CARDIAC) PF 100 MG/5ML IV SOSY
PREFILLED_SYRINGE | INTRAVENOUS | Status: DC | PRN
  Administered 2019-07-28: 50 mg via INTRAVENOUS

## 2019-07-28 MED ORDER — LIDOCAINE HCL (PF) 2 % IJ SOLN
INTRAMUSCULAR | Status: AC
  Filled 2019-07-28: qty 10

## 2019-07-28 MED ORDER — PROPOFOL 500 MG/50ML IV EMUL
INTRAVENOUS | Status: AC
  Filled 2019-07-28: qty 50

## 2019-07-28 MED ORDER — PROPOFOL 500 MG/50ML IV EMUL
INTRAVENOUS | Status: DC | PRN
  Administered 2019-07-28: 120 ug/kg/min via INTRAVENOUS

## 2019-07-28 MED ORDER — SODIUM CHLORIDE 0.9 % IV SOLN
INTRAVENOUS | Status: DC
  Administered 2019-07-28: 1000 mL via INTRAVENOUS

## 2019-07-28 NOTE — H&P (Signed)
Outpatient short stay form Pre-procedure 07/28/2019 8:20 AM Maleek Craver K. Alice Reichert, M.D.  Primary Physician: Delight Stare, M.D.  Reason for visit: Personal hx of adenomatous colon polyps  History of present illness:  Patient has a personal hx of adenomatous colon polyps 2015 Colonoscopy. Patient denies change in bowel habits, rectal bleeding, weight loss or abdominal pain.     Current Facility-Administered Medications:  .  0.9 %  sodium chloride infusion, , Intravenous, Continuous, Naranja, Benay Pike, MD, Last Rate: 20 mL/hr at 07/28/19 0732, 1,000 mL at 07/28/19 0732  Medications Prior to Admission  Medication Sig Dispense Refill Last Dose  . Fish Oil-Cholecalciferol (FISH OIL + D3 PO) Take 1 capsule by mouth daily.    Past Week at Unknown time  . allopurinol (ZYLOPRIM) 300 MG tablet Take 300 mg by mouth daily.     Marland Kitchen aspirin EC 81 MG tablet Take 81 mg by mouth daily.     . Cholecalciferol 1000 UNITS tablet Take 1,000 Units by mouth daily.     Marland Kitchen CINNAMON PO Take 140 mg by mouth daily.     . diclofenac sodium (VOLTAREN) 1 % GEL Apply 2 g topically as needed.     . Hylan (SYNVISC IX) Inject 6 mLs into the articular space every 6 (six) months. Both knees     . lisinopril (PRINIVIL,ZESTRIL) 10 MG tablet Take 10 mg by mouth daily.     . metFORMIN (GLUCOPHAGE) 500 MG tablet Take 1 tablet (500 mg total) by mouth 2 (two) times daily with a meal. (Patient taking differently: Take 500 mg by mouth 2 (two) times daily with a meal. 2 tabs AM, 1 tab PM) 60 tablet 11   . morphine (MS CONTIN) 15 MG 12 hr tablet Take 15 mg by mouth every 12 (twelve) hours.     Marland Kitchen morphine (MSIR) 15 MG tablet Take 15 mg by mouth daily.     . Multiple Vitamin (MULTIVITAMIN) tablet Take 1 tablet by mouth daily.     . Nutritional Supplements (JUICE PLUS FIBRE) LIQD Take 1 Dose by mouth daily.     . pantoprazole (PROTONIX) 40 MG tablet Take 40 mg by mouth daily.        Allergies  Allergen Reactions  . Latex Other (See Comments)     Nasal inflammation  . Other Anaphylaxis  . Solifenacin Other (See Comments)  . Sulfa Antibiotics Anaphylaxis     Past Medical History:  Diagnosis Date  . Arthritis   . BPH (benign prostatic hyperplasia)   . Cancer ([place])   . Diabetes mellitus, type 2 ([place])   . DVT (deep venous thrombosis) ([place]) 05/12/2017   left upper leg  . Emphysema of lung ([place])    mild - on xray  . Fatty liver   . GERD (gastroesophageal reflux disease)   . Hypertension   . Kidney stone     Review of systems:  Otherwise negative.    Physical Exam  Gen: Alert, oriented. Appears stated age.  HEENT: [place]/AT. PERRLA. Lungs: CTA, no wheezes. CV: RR nl S1, S2. Abd: soft, benign, no masses. BS+ Ext: No edema. Pulses 2+    Planned procedures: Proceed with colonoscopy. The patient understands the nature of the planned procedure, indications, risks, alternatives and potential complications including but not limited to bleeding, infection, perforation, damage to internal organs and possible oversedation/side effects from anesthesia. The patient agrees and gives consent to proceed.  Please refer to procedure notes for findings, recommendations and patient disposition/instructions.  Angelina Neece K. Alice Reichert, M.D. Gastroenterology 07/28/2019  8:20 AM

## 2019-07-28 NOTE — Interval H&P Note (Signed)
History and Physical Interval Note:  07/28/2019 8:21 AM  Ralph Marsh  has presented today for surgery, with the diagnosis of [place].  The various methods of treatment have been discussed with the patient and family. After consideration of risks, benefits and other options for treatment, the patient has consented to  Procedure(s): COLONOSCOPY WITH PROPOFOL (N/A) as a surgical intervention.  The patient's history has been reviewed, patient examined, no change in status, stable for surgery.  I have reviewed the patient's chart and labs.  Questions were answered to the patient's satisfaction.     [place], [place]

## 2019-07-28 NOTE — Anesthesia Preprocedure Evaluation (Signed)
Anesthesia Evaluation  Patient identified by MRN, date of birth, ID band Patient awake    Reviewed: Allergy & Precautions, NPO status , Patient's Chart, lab work & pertinent test results, reviewed documented beta blocker date and time   Airway Mallampati: II  TM Distance: >3 FB     Dental  (+) Chipped   Pulmonary COPD, former smoker,           Cardiovascular hypertension, Pt. on medications + DVT       Neuro/Psych    GI/Hepatic GERD  ,  Endo/Other  diabetes, Type 2  Renal/GU Renal disease     Musculoskeletal  (+) Arthritis ,   Abdominal   Peds  Hematology   Anesthesia Other Findings Gout.  Reproductive/Obstetrics                             Anesthesia Physical Anesthesia Plan  ASA: III  Anesthesia Plan: General   Post-op Pain Management:    Induction: Intravenous  PONV Risk Score and Plan:   Airway Management Planned:   Additional Equipment:   Intra-op Plan:   Post-operative Plan:   Informed Consent: I have reviewed the patients History and Physical, chart, labs and discussed the procedure including the risks, benefits and alternatives for the proposed anesthesia with the patient or authorized representative who has indicated his/her understanding and acceptance.       Plan Discussed with: CRNA  Anesthesia Plan Comments:         Anesthesia Quick Evaluation

## 2019-07-28 NOTE — Anesthesia Post-op Follow-up Note (Signed)
Anesthesia QCDR form completed.        

## 2019-07-28 NOTE — Op Note (Signed)
[facility] Gastroenterology Patient Name: Ralph Marsh Procedure Date: 07/28/2019 7:36 AM MRN: [id number] Account #: [id number] Date of Birth: 01-09-1946 Admit Type: Outpatient Age: 73 Room: [facility] ENDO ROOM 3 Gender: Male Note Status: Finalized Procedure:            Colonoscopy Indications:          Surveillance: Personal history of adenomatous polyps on                        last colonoscopy > 5 years ago Providers:            Lorie Apley K. Alice Reichert MD, MD Referring MD:         Marguerita Merles, MD (Referring MD) Medicines:            Propofol per Anesthesia Complications:        No immediate complications. Procedure:            Pre-Anesthesia Assessment:                       - The risks and benefits of the procedure and the                        sedation options and risks were discussed with the                        patient. All questions were answered and informed                        consent was obtained.                       - Patient identification and proposed procedure were                        verified prior to the procedure by the nurse. The                        procedure was verified in the procedure room.                       - ASA Grade Assessment: III - A patient with severe                        systemic disease.                       - After reviewing the risks and benefits, the patient                        was deemed in satisfactory condition to undergo the                        procedure.                       After obtaining informed consent, the colonoscope was                        passed under direct vision. Throughout the procedure,  the patient's blood pressure, pulse, and oxygen                        saturations were monitored continuously. The                        Colonoscope was introduced through the anus and                        advanced to the the cecum, identified by appendiceal          orifice and ileocecal valve. The colonoscopy was                        performed without difficulty. The patient tolerated the                        procedure well. The quality of the bowel preparation                        was good except the sigmoid colon was fair. Findings:      The perianal and digital rectal examinations were normal. Pertinent       negatives include normal sphincter tone and no palpable rectal lesions.      Multiple small and large-mouthed diverticula were found in the entire       colon. There was no evidence of diverticular bleeding.      Non-bleeding internal hemorrhoids were found during retroflexion. The       hemorrhoids were Grade I (internal hemorrhoids that do not prolapse).      The exam was otherwise without abnormality. Impression:           - Moderate diverticulosis in the entire examined colon.                        There was no evidence of diverticular bleeding.                       - Non-bleeding internal hemorrhoids.                       - The examination was otherwise normal.                       - No specimens collected. Recommendation:       - Patient has a contact number available for                        emergencies. The signs and symptoms of potential                        delayed complications were discussed with the patient.                        Return to normal activities tomorrow. Written discharge                        instructions were provided to the patient.                       - Resume previous diet.                       -  Continue present medications.                       - No repeat colonoscopy due to current age (52 years or                        older) and the absence of colonic polyps.                       - Return to GI office PRN.                       - The findings and recommendations were discussed with                        the patient. Procedure Code(s):    --- Professional ---                        [id number], Colorectal cancer screening; colonoscopy on                        individual at high risk Diagnosis Code(s):    --- Professional ---                       K57.30, Diverticulosis of large intestine without                        perforation or abscess without bleeding                       K64.0, First degree hemorrhoids                       Z86.010, Personal history of colonic polyps CPT copyright 2019 [employer]. All rights reserved. The codes documented in this report are preliminary and upon coder review may  be revised to meet current compliance requirements. Efrain Sella MD, MD 07/28/2019 8:46:36 AM This report has been signed electronically. Number of Addenda: 0 Note Initiated On: 07/28/2019 7:36 AM Scope Withdrawal Time: 0 hours 6 minutes 15 seconds  Total Procedure Duration: 0 hours 10 minutes 51 seconds  Estimated Blood Loss: Estimated blood loss: none.      [facility]

## 2019-07-28 NOTE — Anesthesia Postprocedure Evaluation (Signed)
Anesthesia Post Note  Patient: Ralph Marsh  Procedure(s) Performed: COLONOSCOPY WITH PROPOFOL (N/A )  Patient location during evaluation: Endoscopy Anesthesia Type: General Level of consciousness: awake and alert Pain management: pain level controlled Vital Signs Assessment: post-procedure vital signs reviewed and stable Respiratory status: spontaneous breathing, nonlabored ventilation, respiratory function stable and patient connected to nasal cannula oxygen Cardiovascular status: blood pressure returned to baseline and stable Postop Assessment: no apparent nausea or vomiting Anesthetic complications: no     Last Vitals:  Vitals:   07/28/19 0713 07/28/19 0843  BP: (!) 123/107   Pulse: (!) 106   Resp: 20   Temp: (!) 36.2 C 36.4 C  SpO2: 96%     Last Pain:  Vitals:   07/28/19 0903  TempSrc:   PainSc: 0-No pain                 [place] Festa S

## 2019-07-28 NOTE — Transfer of Care (Signed)
Immediate Anesthesia Transfer of Care Note  Patient: Ralph Marsh  Procedure(s) Performed: COLONOSCOPY WITH PROPOFOL (N/A )  Patient Location: PACU and Endoscopy Unit  Anesthesia Type:General  Level of Consciousness: drowsy  Airway & Oxygen Therapy: Patient Spontanous Breathing  Post-op Assessment: Report given to RN and Post -op Vital signs reviewed and stable  Post vital signs: Reviewed and stable  Last Vitals:  Vitals Value Taken Time  BP 107/86 07/28/19 0844  Temp 36.4 C 07/28/19 0843  Pulse 96 07/28/19 0844  Resp 18 07/28/19 0844  SpO2 96 % 07/28/19 0844  Vitals shown include unvalidated device data.  Last Pain:  Vitals:   07/28/19 0843  TempSrc: Tympanic  PainSc:          Complications: No apparent anesthesia complications

## 2019-07-29 ENCOUNTER — Encounter: Payer: Self-pay | Admitting: Internal Medicine

## 2019-11-24 DIAGNOSIS — M5137 Other intervertebral disc degeneration, lumbosacral region: Secondary | ICD-10-CM | POA: Insufficient documentation

## 2021-03-14 ENCOUNTER — Emergency Department: Payer: No Typology Code available for payment source

## 2021-03-14 ENCOUNTER — Emergency Department
Admission: EM | Admit: 2021-03-14 | Discharge: 2021-03-14 | Disposition: A | Discharge status: Home / Self Care | Payer: No Typology Code available for payment source | Attending: Emergency Medicine | Admitting: Emergency Medicine

## 2021-03-14 DIAGNOSIS — Z79899 Other long term (current) drug therapy: Secondary | ICD-10-CM | POA: Diagnosis not present

## 2021-03-14 DIAGNOSIS — Z87442 Personal history of urinary calculi: Secondary | ICD-10-CM | POA: Insufficient documentation

## 2021-03-14 DIAGNOSIS — I10 Essential (primary) hypertension: Secondary | ICD-10-CM | POA: Diagnosis not present

## 2021-03-14 DIAGNOSIS — Z91040 Latex allergy status: Secondary | ICD-10-CM | POA: Insufficient documentation

## 2021-03-14 DIAGNOSIS — E785 Hyperlipidemia, unspecified: Secondary | ICD-10-CM | POA: Insufficient documentation

## 2021-03-14 DIAGNOSIS — E1169 Type 2 diabetes mellitus with other specified complication: Secondary | ICD-10-CM | POA: Diagnosis not present

## 2021-03-14 DIAGNOSIS — Z87891 Personal history of nicotine dependence: Secondary | ICD-10-CM | POA: Insufficient documentation

## 2021-03-14 DIAGNOSIS — R109 Unspecified abdominal pain: Secondary | ICD-10-CM

## 2021-03-14 DIAGNOSIS — Z7982 Long term (current) use of aspirin: Secondary | ICD-10-CM | POA: Insufficient documentation

## 2021-03-14 DIAGNOSIS — N23 Unspecified renal colic: Secondary | ICD-10-CM | POA: Diagnosis not present

## 2021-03-14 LAB — URINALYSIS, COMPLETE (UACMP) WITH MICROSCOPIC
Bilirubin Urine: NEGATIVE
Glucose, UA: NEGATIVE mg/dL
Ketones, ur: NEGATIVE mg/dL
Leukocytes,Ua: NEGATIVE
Nitrite: NEGATIVE
Protein, ur: NEGATIVE mg/dL
RBC / HPF: >50 RBC/hpf — ABNORMAL HIGH (ref 0–5)
Specific Gravity, Urine: 1.02 (ref 1.005–1.030)
Squamous Epithelial / HPF: NONE SEEN (ref 0–5)
pH: 5 (ref 5.0–8.0)

## 2021-03-14 LAB — TROPONIN I (HIGH SENSITIVITY)
Troponin I (High Sensitivity): 7 ng/L (ref <18)
Troponin I (High Sensitivity): 7 ng/L (ref <18)

## 2021-03-14 LAB — CBC
HCT: 46.2 % (ref 39.0–52.0)
Hemoglobin: 15.9 g/dL (ref 13.0–17.0)
MCH: 31.7 pg (ref 26.0–34.0)
MCHC: 34.4 g/dL (ref 30.0–36.0)
MCV: 92.2 fL (ref 80.0–100.0)
Platelets: 169 10*3/uL (ref 150–400)
RBC: 5.01 MIL/uL (ref 4.22–5.81)
RDW: 13.2 % (ref 11.5–15.5)
WBC: 14.9 10*3/uL — ABNORMAL HIGH (ref 4.0–10.5)
nRBC: 0 % (ref 0.0–0.2)

## 2021-03-14 LAB — BASIC METABOLIC PANEL
Anion gap: 9 (ref 5–15)
BUN: 20 mg/dL (ref 8–23)
CO2: 23 mmol/L (ref 22–32)
Calcium: 8.8 mg/dL — ABNORMAL LOW (ref 8.9–10.3)
Chloride: 105 mmol/L (ref 98–111)
Creatinine, Ser: 1.36 mg/dL — ABNORMAL HIGH (ref 0.61–1.24)
GFR, Estimated: 54 mL/min — ABNORMAL LOW (ref >60)
Glucose, Bld: 172 mg/dL — ABNORMAL HIGH (ref 70–99)
Potassium: 4.1 mmol/L (ref 3.5–5.1)
Sodium: 137 mmol/L (ref 135–145)

## 2021-03-14 MED ORDER — ONDANSETRON 4 MG PO TBDP: ORAL_TABLET | ORAL | 0 refills | Status: DC

## 2021-03-14 MED ORDER — ONDANSETRON HCL 4 MG/2ML IJ SOLN
4.0000 mg | INTRAMUSCULAR | Status: AC
  Administered 2021-03-14: 4 mg via INTRAVENOUS
  Filled 2021-03-14: qty 2

## 2021-03-14 MED ORDER — KETOROLAC TROMETHAMINE 10 MG PO TABS: 10.0000 mg | ORAL_TABLET | Freq: Four times a day (QID) | ORAL | 0 refills | Status: DC | PRN

## 2021-03-14 MED ORDER — KETOROLAC TROMETHAMINE 30 MG/ML IJ SOLN
15.0000 mg | Freq: Once | INTRAMUSCULAR | Status: AC
  Administered 2021-03-14: 15 mg via INTRAVENOUS
  Filled 2021-03-14: qty 1

## 2021-03-14 MED ORDER — SODIUM CHLORIDE 0.9 % IV BOLUS
500.0000 mL | Freq: Once | INTRAVENOUS | Status: AC
  Administered 2021-03-14: 500 mL via INTRAVENOUS

## 2021-03-14 NOTE — Discharge Instructions (Signed)
You have been seen in the Emergency Department (ED) today for pain caused by kidney stones.  As we have discussed, please drink plenty of fluids.  Please make a follow up appointment with the physician(s) listed elsewhere in this documentation.  You may take pain medication as needed but ONLY as prescribed.  Please also take your prescribed Flomax daily.  If you are going to follow up with Urology for possible lithotripsy, please do not take any ibuprofen, naproxen, aspirin, Toradol, or other NSAID after Tuesday morning, as this may exclude you from lithotripsy.  Please see your doctor as soon as possible as stones may take 1-3 weeks to pass and you may require additional care or medications.  Do not drink alcohol, drive or participate in any other potentially dangerous activities while taking opiate pain medication as it may make you sleepy. Do not take this medication with any other sedating medications, either prescription or over-the-counter. If you were prescribed Percocet or Vicodin, do not take these with acetaminophen (Tylenol) as it is already contained within these medications.   Take your regular morphine as needed for severe pain.  This medication is an opiate (or narcotic) pain medication and can be habit forming.  Use it as little as possible to achieve adequate pain control.  Do not use or use it with extreme caution if you have a history of opiate abuse or dependence.  If you are on a pain contract with your primary care doctor or a pain specialist, be sure to let them know you were prescribed this medication today from the [facility] Emergency Department.  This medication is intended for your use only - do not give any to anyone else and keep it in a secure place where nobody else, especially children, have access to it.  It will also cause or worsen constipation, so you may want to consider taking an over-the-counter stool softener while you are taking this medication.  Return to  the Emergency Department (ED) or call your doctor if you have any worsening pain, fever, painful urination, are unable to urinate, or develop other symptoms that concern you.

## 2021-03-14 NOTE — ED Notes (Signed)
Pt to xray at this time.

## 2021-03-14 NOTE — ED Provider Notes (Signed)
[facility] Emergency Department Provider Note  ____________________________________________   Event Date/Time   First MD Initiated Contact with Patient 03/14/21 [phone]     (approximate)  I have reviewed the triage vital signs and the nursing notes.   HISTORY  Chief Complaint Flank Pain and Chest Pain    HPI Ralph Marsh is a 75 y.o. male with medical history as listed below who presents for evaluation of acute onset pain all throughout his right side.  He said that it  started rather acutely and concern him because he does not usually have chest pain.  He has no history of cardiac disease and has had evaluations by Dr. Ubaldo Glassing in the past.  However he said he has sharp pain in his right flank up into his chest and his arm but also into his abdomen.  However he has a history of recurrent kidney stones and has required lithotripsy in the past.  He and his wife both confirmed that he suffers a great deal from the kidney stone pain and that in the past it is even put him into atrial fibrillation and that he feels it all throughout his body.  He has not noticed any blood in his urine.  He has had some nausea but no vomiting.  No recent fever.  No dysuria.  Nothing in particular makes his symptoms better or worse.  He reports persistent pain now but it is primarily in his right flank and right side of his abdomen.        Past Medical History:  Diagnosis Date  . Arthritis   . BPH (benign prostatic hyperplasia)   . Diabetes mellitus, type 2 ([place])   . DVT (deep venous thrombosis) ([place]) 05/12/2017   left upper leg  . Emphysema of lung ([place])    mild - on xray  . Fatty liver   . GERD (gastroesophageal reflux disease)   . Hypertension   . Kidney stone     Patient Active Problem List   Diagnosis Date Noted  . Essential hypertension 06/04/2017  . GERD (gastroesophageal reflux disease) 06/04/2017  . Hyperlipidemia 06/04/2017  . Acute deep vein thrombosis (DVT) of  femoral vein of left lower extremity ([place]) 05/13/2017  . Obstructive uropathy 01/20/2017    Past Surgical History:  Procedure Laterality Date  . CATARACT EXTRACTION W/PHACO Right 09/11/2018   Procedure: CATARACT EXTRACTION PHACO AND INTRAOCULAR LENS PLACEMENT ([place])  RIGHT DIABETIC;  Surgeon: Leandrew Koyanagi, MD;  Location: [place];  Service: Ophthalmology;  Laterality: Right;  Diabetic - oral meds Latex sensitiviy  . CATARACT EXTRACTION W/PHACO Left 10/09/2018   Procedure: CATARACT EXTRACTION PHACO AND INTRAOCULAR LENS PLACEMENT ([place])  LEFT DIABETIC;  Surgeon: Leandrew Koyanagi, MD;  Location: [place];  Service: Ophthalmology;  Laterality: Left;  Diabetic - oral meds Latex sensitivity  . COLONOSCOPY WITH PROPOFOL N/A 07/28/2019   Procedure: COLONOSCOPY WITH PROPOFOL;  Surgeon: Toledo, Benay Pike, MD;  Location: [facility];  Service: Gastroenterology;  Laterality: N/A;  . EXTRACORPOREAL SHOCK WAVE LITHOTRIPSY Left 01/25/2017   Procedure: EXTRACORPOREAL SHOCK WAVE LITHOTRIPSY (ESWL);  Surgeon: Nickie Retort, MD;  Location: [facility];  Service: Urology;  Laterality: Left;  . LASER OF PROSTATE W/ GREEN LIGHT PVP  2008   Dr. Quillian Quince  . LUMBAR LAMINECTOMY    . TONSILLECTOMY AND ADENOIDECTOMY    . URETEROSCOPY WITH HOLMIUM LASER LITHOTRIPSY  2004    Prior to Admission medications   Medication Sig Start Date End Date Taking?  Authorizing Provider  ketorolac (TORADOL) 10 MG tablet Take 1 tablet (10 mg total) by mouth every 6 (six) hours as needed for moderate pain or severe pain. 03/14/21  Yes Hinda Kehr, MD  ondansetron (ZOFRAN ODT) 4 MG disintegrating tablet Allow 1-2 tablets to dissolve in your mouth every 8 hours as needed for nausea/vomiting 03/14/21  Yes Hinda Kehr, MD  allopurinol (ZYLOPRIM) 300 MG tablet Take 300 mg by mouth daily.    [provider]  aspirin EC 81 MG tablet Take 81 mg by mouth daily.    [provider]  Cholecalciferol  1000 UNITS tablet Take 1,000 Units by mouth daily.    [provider]  CINNAMON PO Take 140 mg by mouth daily.    [provider]  diclofenac sodium (VOLTAREN) 1 % GEL Apply 2 g topically as needed.    [provider]  Fish Oil-Cholecalciferol (FISH OIL + D3 PO) Take 1 capsule by mouth daily.     [provider]  Hylan (SYNVISC IX) Inject 6 mLs into the articular space every 6 (six) months. Both knees    [provider]  lisinopril (PRINIVIL,ZESTRIL) 10 MG tablet Take 10 mg by mouth daily.    [provider]  metFORMIN (GLUCOPHAGE) 500 MG tablet Take 1 tablet (500 mg total) by mouth 2 (two) times daily with a meal. Patient taking differently: Take 500 mg by mouth 2 (two) times daily with a meal. 2 tabs AM, 1 tab PM 11/12/17 11/12/18  Lavonia Drafts, MD  morphine (MS CONTIN) 15 MG 12 hr tablet Take 15 mg by mouth every 12 (twelve) hours.    [provider]  morphine (MSIR) 15 MG tablet Take 15 mg by mouth daily.    [provider]  Multiple Vitamin (MULTIVITAMIN) tablet Take 1 tablet by mouth daily.    [provider]  Nutritional Supplements (JUICE PLUS FIBRE) LIQD Take 1 Dose by mouth daily.    [provider]  pantoprazole (PROTONIX) 40 MG tablet Take 40 mg by mouth daily.    [provider]    Allergies Augmentin [amoxicillin-pot clavulanate], Latex, Other, Solifenacin, and Sulfa antibiotics  Family History  Problem Relation Age of Onset  . Kidney disease Neg Hx   . Kidney cancer Neg Hx   . Prostate cancer Neg Hx     Social History Social History   Tobacco Use  . Smoking status: Former Smoker    Quit date: 1995    Years since quitting: 27.3  . Smokeless tobacco: Never Used  Vaping Use  . Vaping Use: Never used  Substance Use Topics  . Alcohol use: Yes    Alcohol/week: 0.0 standard drinks    Comment: Holidays only  . Drug use: No    Review of Systems Constitutional: No  fever/chills Eyes: No visual changes. ENT: No sore throat. Cardiovascular: Right-sided chest pain. Respiratory: Denies shortness of breath. Gastrointestinal: Right-sided abdominal and flank pain.  Nausea, no vomiting. Genitourinary: Negative for dysuria. Musculoskeletal: Right-sided flank pain. Integumentary: Negative for rash. Neurological: Negative for headaches, focal weakness or numbness.   ____________________________________________   PHYSICAL EXAM:  VITAL SIGNS: ED Triage Vitals  Enc Vitals Group     BP 03/14/21 0205 (!) 166/104     Pulse Rate 03/14/21 0204 (!) 107     Resp 03/14/21 0204 18     Temp 03/14/21 0206 98.8 F (37.1 C)     Temp Source 03/14/21 0206 Oral     SpO2 03/14/21 0204  94 %     Weight 03/14/21 0201 93.4 kg (206 lb)     Height 03/14/21 0200 1.854 m (6\' 1" )     Head Circumference --      Peak Flow --      Pain Score 03/14/21 0201 7     Pain Loc --      Pain Edu? --      Excl. in [place]? --     Constitutional: Alert and oriented.  Patient does not appear to be in any distress although he reports severe pain. Eyes: Conjunctivae are normal.  Head: Atraumatic. Nose: No congestion/rhinnorhea. Mouth/Throat: Patient is wearing a mask. Neck: No stridor.  No meningeal signs.   Cardiovascular: Normal rate, regular rhythm. Good peripheral circulation. Respiratory: Normal respiratory effort.  No retractions. Gastrointestinal: Soft and nondistended.  Mild tenderness to palpation of the far lateral right side of his abdomen. Musculoskeletal: Right flank tenderness to percussion.  No lower extremity tenderness nor edema. No gross deformities of extremities. Neurologic:  Normal speech and language. No gross focal neurologic deficits are appreciated.  Skin:  Skin is warm, dry and intact. Psychiatric: Mood and affect are normal. Speech and behavior are normal.  ____________________________________________   LABS (all labs ordered are listed, but only abnormal  results are displayed)  Labs Reviewed  BASIC METABOLIC PANEL - Abnormal; Notable for the following components:      Result Value   Glucose, Bld 172 (*)    Creatinine, Ser 1.36 (*)    Calcium 8.8 (*)    GFR, Estimated 54 (*)    All other components within normal limits  CBC - Abnormal; Notable for the following components:   WBC 14.9 (*)    All other components within normal limits  URINALYSIS, COMPLETE (UACMP) WITH MICROSCOPIC - Abnormal; Notable for the following components:   Color, Urine YELLOW (*)    APPearance CLEAR (*)    Hgb urine dipstick LARGE (*)    RBC / HPF >50 (*)    Bacteria, UA RARE (*)    All other components within normal limits  TROPONIN I (HIGH SENSITIVITY)  TROPONIN I (HIGH SENSITIVITY)   ____________________________________________  EKG  ED ECG REPORT I, Hinda Kehr, the attending physician, personally viewed and interpreted this ECG.  Date: 03/14/2021 EKG Time: 2:01 AM Rate: 104 Rhythm: Sinus tachycardia QRS Axis: normal Intervals: normal ST/T Wave abnormalities: Non-specific ST segment / T-wave changes, but no clear evidence of acute ischemia. Narrative Interpretation: no definitive evidence of acute ischemia; does not meet STEMI criteria.   ____________________________________________  RADIOLOGY I, Hinda Kehr, personally viewed and evaluated these images (plain radiographs) as part of my medical decision making, as well as reviewing the written report by the radiologist.  ED MD interpretation: No acute abnormalities identified on chest x-ray.  Patient has several calcified stones on the right proximal ureter.  Official radiology report(s): DG Chest 2 View  Result Date: 03/14/2021 CLINICAL DATA:  Chest pain. EXAM: CHEST - 2 VIEW COMPARISON:  None. FINDINGS: The heart size and mediastinal contours are within normal limits. Atherosclerotic plaque. No focal consolidation. No pulmonary edema. No pleural effusion. No pneumothorax. No acute osseous  abnormality.  Shoulder degenerative changes. IMPRESSION: No active cardiopulmonary disease. Electronically Signed   By: Iven Finn M.D.   On: 03/14/2021 02:26   CT Renal Stone Study  Result Date: 03/14/2021 CLINICAL DATA:  Flank pain.  Kidney stone suspected.  Hematuria. EXAM: CT ABDOMEN AND PELVIS WITHOUT CONTRAST TECHNIQUE: Multidetector CT imaging of  the abdomen and pelvis was performed following the standard protocol without IV contrast. COMPARISON:  CT renal 01/20/2017 FINDINGS: Lower chest: Bilateral lower lobe subsegmental atelectasis. Coronary artery calcifications. Small hiatal hernia. Hepatobiliary: No focal liver abnormality. Large peripherally calcified stones measuring up to 2 cm within the gallbladder lumen. No gallbladder wall thickening or pericholecystic fluid. No biliary dilatation. Pancreas: Diffusely atrophic. No focal lesion. Otherwise normal pancreatic contour. No surrounding inflammatory changes. No main pancreatic ductal dilatation. Spleen: Normal in size without focal abnormality. Adrenals/Urinary Tract: No adrenal nodule bilaterally. Nonspecific bilateral perinephric stranding. Several 1-2 mm bilateral nonobstructive nephrolithiasis. Couple of punctate calcified stones adjacent to each other measuring up to 4 mm within the proximal right ureter with associated mild hydroureteronephrosis. The right ureter distally is normal in caliber with no further ureterolithiasis. No left hydronephrosis. No contour-deforming renal mass. No left ureterolithiasis or hydroureter. The urinary bladder is unremarkable. Stomach/Bowel: Stomach is within normal limits. No evidence of bowel wall thickening or dilatation. Diffuse colonic diverticulosis. Appendix appears normal. Vascular/Lymphatic: No abdominal aorta or iliac aneurysm. Moderate atherosclerotic plaque of the aorta and its branches. No abdominal, pelvic, or inguinal lymphadenopathy. Reproductive: Prostate is unremarkable. Other: No  intraperitoneal free fluid. No intraperitoneal free gas. No organized fluid collection. Musculoskeletal: No acute or significant osseous findings. IMPRESSION: 1. Couple of obstructive calcified stone measuring up to 4 mm within the right proximal ureter. 2. Nonobstructive 1-2 mm nephrolithiasis bilaterally. 3. Cholelithiasis with no CT evidence of acute cholecystitis or choledocholithiasis. 4. Diffuse colonic diverticulosis with no acute diverticulitis. 5. Small hiatal hernia. 6.  Aortic Atherosclerosis (ICD10-I70.0). Electronically Signed   By: Iven Finn M.D.   On: 03/14/2021 05:27    ____________________________________________   PROCEDURES   Procedure(s) performed (including Critical Care):  .1-3 Lead EKG Interpretation Performed by: Hinda Kehr, MD Authorized by: Hinda Kehr, MD     Interpretation: normal     ECG rate:  75   ECG rate assessment: normal     Rhythm: sinus rhythm     Ectopy: none     Conduction: normal       ____________________________________________   INITIAL IMPRESSION / MDM / ASSESSMENT AND PLAN / ED COURSE  As part of my medical decision making, I reviewed the following data within the [place] History obtained from family, Nursing notes reviewed and incorporated, Labs reviewed , EKG interpreted , Old chart reviewed, Radiograph reviewed  and Notes from prior ED visits   Differential diagnosis includes, but is not limited to, renal/ureteral colic, ACS, PE, pneumonia, musculoskeletal strain.  The patient is on the cardiac monitor to evaluate for evidence of arrhythmia and/or significant heart rate changes.  Patient is not having any persistent chest pain.  I think that he may have had referred pain from a kidney stone.  His urinalysis is notable for hematuria and he has an extensive history of kidney stones.  Nonischemic EKG.  Stable vital signs other than hypertension.  Basic metabolic panel shows stable chronic kidney disease.   CBC has a mild leukocytosis of unclear clinical significance.  No evidence of infection on urinalysis although I will send a urine culture.  High-sensitivity troponin is stable over 2 measurements at 7.  Low suspicion for ACS.  Low suspicion for PE.  I will proceed with evaluation with CT renal stone protocol.  The patient reports that of all the medications he got previously when this happened to him, the only thing that helped was Toradol, so I am giving Toradol 15  mg IV as well as Zofran 4 mg IV and I will reassess.     Clinical Course as of 03/14/21 0714  Mon Mar 14, 2021  [id number] The patient's pain resolved after Toradol 15 mg IV.  His CT scan shows a 4 mm collection of stones in the right proximal ureter.  He is appropriate for discharge and outpatient follow-up with urology.  Patient is comfortable with that plan.  He really feels that Toradol is the best thing for him and I told him that Toradol is not typically something we prescribe as an outpatient, particularly given some baseline chronic kidney disease.  However I calculated his GFR and it is still 54.  A short course of Toradol, just for the next 24 hours so as not to exclude him from the possibility of lithotripsy on Thursday, should be appropriate.  I will give him a very short course. [CF]  [phone] The patient already receives oral morphine due to chronic back pain and I will also provide a prescription for Zofran and Flomax.  I also counseled the patient to not take the Toradol or any other NSAID after early Tuesday morning as this might exclude him from possible lithotripsy. [CF]    Clinical Course User Index [CF] Hinda Kehr, MD     ____________________________________________  FINAL CLINICAL IMPRESSION(S) / ED DIAGNOSES  Final diagnoses:  Right flank pain  Ureteral colic     MEDICATIONS GIVEN DURING THIS VISIT:  Medications  ketorolac (TORADOL) 30 MG/ML injection 15 mg (15 mg Intravenous Given 03/14/21 0457)  ondansetron  (ZOFRAN) injection 4 mg (4 mg Intravenous Given 03/14/21 0457)  sodium chloride 0.9 % bolus 500 mL (0 mLs Intravenous Stopped 03/14/21 0622)     ED Discharge Orders         Ordered    ondansetron (ZOFRAN ODT) 4 MG disintegrating tablet        03/14/21 0626    ketorolac (TORADOL) 10 MG tablet  Every 6 hours PRN        03/14/21 0630          *Please note:  JEANPIERRE IVERY was evaluated in Emergency Department on 03/14/2021 for the symptoms described in the history of present illness. He was evaluated in the context of the global COVID-19 pandemic, which necessitated consideration that the patient might be at risk for infection with the SARS-CoV-2 virus that causes COVID-19. Institutional protocols and algorithms that pertain to the evaluation of patients at risk for COVID-19 are in a state of rapid change based on information released by regulatory bodies including the CDC and federal and state organizations. These policies and algorithms were followed during the patient's care in the ED.  Some ED evaluations and interventions may be delayed as a result of limited staffing during and after the pandemic.*  Note:  This document was prepared using Dragon voice recognition software and may include unintentional dictation errors.   Hinda Kehr, MD 03/14/21 [phone]

## 2021-03-14 NOTE — ED Triage Notes (Signed)
Pt reports left sided chest pain x12 hours. Worse with deep breath. Reports nausea. Denies emesis. Denies cardiac hx.   Pt also reports right sided flank pain x24 hours, reports history of "many kidney stones." Denies pain with urination/blood in urine.

## 2021-05-14 ENCOUNTER — Emergency Department: Payer: No Typology Code available for payment source

## 2021-05-14 ENCOUNTER — Encounter: Payer: Self-pay | Admitting: Emergency Medicine

## 2021-05-14 ENCOUNTER — Other Ambulatory Visit: Payer: Self-pay

## 2021-05-14 ENCOUNTER — Inpatient Hospital Stay
Admission: EM | Admit: 2021-05-14 | Discharge: 2021-05-18 | DRG: 281 | Disposition: A | Discharge status: Other Acute Inpatient Hospital | Payer: No Typology Code available for payment source | Attending: Internal Medicine | Admitting: Internal Medicine

## 2021-05-14 DIAGNOSIS — Z79899 Other long term (current) drug therapy: Secondary | ICD-10-CM | POA: Diagnosis not present

## 2021-05-14 DIAGNOSIS — E785 Hyperlipidemia, unspecified: Secondary | ICD-10-CM | POA: Diagnosis present

## 2021-05-14 DIAGNOSIS — Z7984 Long term (current) use of oral hypoglycemic drugs: Secondary | ICD-10-CM

## 2021-05-14 DIAGNOSIS — M109 Gout, unspecified: Secondary | ICD-10-CM | POA: Insufficient documentation

## 2021-05-14 DIAGNOSIS — Z87891 Personal history of nicotine dependence: Secondary | ICD-10-CM

## 2021-05-14 DIAGNOSIS — Z20822 Contact with and (suspected) exposure to covid-19: Secondary | ICD-10-CM | POA: Diagnosis present

## 2021-05-14 DIAGNOSIS — Z6827 Body mass index (BMI) 27.0-27.9, adult: Secondary | ICD-10-CM

## 2021-05-14 DIAGNOSIS — I82412 Acute embolism and thrombosis of left femoral vein: Secondary | ICD-10-CM | POA: Diagnosis present

## 2021-05-14 DIAGNOSIS — Z880 Allergy status to penicillin: Secondary | ICD-10-CM

## 2021-05-14 DIAGNOSIS — Z86711 Personal history of pulmonary embolism: Secondary | ICD-10-CM

## 2021-05-14 DIAGNOSIS — N138 Other obstructive and reflux uropathy: Secondary | ICD-10-CM | POA: Diagnosis present

## 2021-05-14 DIAGNOSIS — I10 Essential (primary) hypertension: Secondary | ICD-10-CM | POA: Diagnosis present

## 2021-05-14 DIAGNOSIS — K219 Gastro-esophageal reflux disease without esophagitis: Secondary | ICD-10-CM | POA: Diagnosis present

## 2021-05-14 DIAGNOSIS — E119 Type 2 diabetes mellitus without complications: Secondary | ICD-10-CM | POA: Diagnosis present

## 2021-05-14 DIAGNOSIS — J439 Emphysema, unspecified: Secondary | ICD-10-CM | POA: Diagnosis present

## 2021-05-14 DIAGNOSIS — Z961 Presence of intraocular lens: Secondary | ICD-10-CM | POA: Diagnosis present

## 2021-05-14 DIAGNOSIS — Z9842 Cataract extraction status, left eye: Secondary | ICD-10-CM

## 2021-05-14 DIAGNOSIS — Z87442 Personal history of urinary calculi: Secondary | ICD-10-CM

## 2021-05-14 DIAGNOSIS — R0902 Hypoxemia: Secondary | ICD-10-CM | POA: Diagnosis present

## 2021-05-14 DIAGNOSIS — Z888 Allergy status to other drugs, medicaments and biological substances status: Secondary | ICD-10-CM

## 2021-05-14 DIAGNOSIS — Z86718 Personal history of other venous thrombosis and embolism: Secondary | ICD-10-CM

## 2021-05-14 DIAGNOSIS — Z7901 Long term (current) use of anticoagulants: Secondary | ICD-10-CM | POA: Insufficient documentation

## 2021-05-14 DIAGNOSIS — E663 Overweight: Secondary | ICD-10-CM | POA: Diagnosis present

## 2021-05-14 DIAGNOSIS — M1A9XX0 Chronic gout, unspecified, without tophus (tophi): Secondary | ICD-10-CM | POA: Diagnosis present

## 2021-05-14 DIAGNOSIS — I2510 Atherosclerotic heart disease of native coronary artery without angina pectoris: Secondary | ICD-10-CM | POA: Diagnosis present

## 2021-05-14 DIAGNOSIS — Z882 Allergy status to sulfonamides status: Secondary | ICD-10-CM

## 2021-05-14 DIAGNOSIS — K8020 Calculus of gallbladder without cholecystitis without obstruction: Secondary | ICD-10-CM | POA: Diagnosis present

## 2021-05-14 DIAGNOSIS — I82513 Chronic embolism and thrombosis of femoral vein, bilateral: Secondary | ICD-10-CM | POA: Diagnosis present

## 2021-05-14 DIAGNOSIS — E7849 Other hyperlipidemia: Secondary | ICD-10-CM | POA: Diagnosis not present

## 2021-05-14 DIAGNOSIS — K760 Fatty (change of) liver, not elsewhere classified: Secondary | ICD-10-CM | POA: Diagnosis present

## 2021-05-14 DIAGNOSIS — E1169 Type 2 diabetes mellitus with other specified complication: Secondary | ICD-10-CM

## 2021-05-14 DIAGNOSIS — I214 Non-ST elevation (NSTEMI) myocardial infarction: Secondary | ICD-10-CM | POA: Diagnosis present

## 2021-05-14 DIAGNOSIS — N401 Enlarged prostate with lower urinary tract symptoms: Secondary | ICD-10-CM | POA: Diagnosis present

## 2021-05-14 DIAGNOSIS — Z9841 Cataract extraction status, right eye: Secondary | ICD-10-CM

## 2021-05-14 DIAGNOSIS — Z91040 Latex allergy status: Secondary | ICD-10-CM

## 2021-05-14 DIAGNOSIS — N139 Obstructive and reflux uropathy, unspecified: Secondary | ICD-10-CM | POA: Diagnosis present

## 2021-05-14 DIAGNOSIS — Z7982 Long term (current) use of aspirin: Secondary | ICD-10-CM | POA: Diagnosis not present

## 2021-05-14 LAB — RESP PANEL BY RT-PCR (FLU A&B, COVID) ARPGX2
Influenza A by PCR: NEGATIVE
Influenza B by PCR: NEGATIVE
SARS Coronavirus 2 by RT PCR: NEGATIVE

## 2021-05-14 LAB — CBC
HCT: 45 % (ref 39.0–52.0)
Hemoglobin: 15.7 g/dL (ref 13.0–17.0)
MCH: 31.8 pg (ref 26.0–34.0)
MCHC: 34.9 g/dL (ref 30.0–36.0)
MCV: 91.1 fL (ref 80.0–100.0)
Platelets: 171 10*3/uL (ref 150–400)
RBC: 4.94 MIL/uL (ref 4.22–5.81)
RDW: 12.7 % (ref 11.5–15.5)
WBC: 7.7 10*3/uL (ref 4.0–10.5)
nRBC: 0 % (ref 0.0–0.2)

## 2021-05-14 LAB — TROPONIN I (HIGH SENSITIVITY)
Troponin I (High Sensitivity): 317 ng/L (ref <18)
Troponin I (High Sensitivity): 532 ng/L (ref <18)

## 2021-05-14 LAB — APTT: aPTT: 29 seconds (ref 24–36)

## 2021-05-14 LAB — PROTIME-INR
INR: 1 (ref 0.8–1.2)
Prothrombin Time: 13.6 seconds (ref 11.4–15.2)

## 2021-05-14 LAB — BASIC METABOLIC PANEL
Anion gap: 13 (ref 5–15)
BUN: 19 mg/dL (ref 8–23)
CO2: 19 mmol/L — ABNORMAL LOW (ref 22–32)
Calcium: 8.9 mg/dL (ref 8.9–10.3)
Chloride: 99 mmol/L (ref 98–111)
Creatinine, Ser: 1.11 mg/dL (ref 0.61–1.24)
GFR, Estimated: >60 mL/min (ref >60)
Glucose, Bld: 282 mg/dL — ABNORMAL HIGH (ref 70–99)
Potassium: 4 mmol/L (ref 3.5–5.1)
Sodium: 131 mmol/L — ABNORMAL LOW (ref 135–145)

## 2021-05-14 LAB — CBG MONITORING, ED: Glucose-Capillary: 217 mg/dL — ABNORMAL HIGH (ref 70–99)

## 2021-05-14 MED ORDER — ONDANSETRON HCL 4 MG PO TABS: 4.0000 mg | ORAL_TABLET | Freq: Four times a day (QID) | ORAL | Status: DC | PRN

## 2021-05-14 MED ORDER — ACETAMINOPHEN 325 MG PO TABS: 650.0000 mg | ORAL_TABLET | Freq: Four times a day (QID) | ORAL | Status: DC | PRN

## 2021-05-14 MED ORDER — ASPIRIN EC 81 MG PO TBEC
81.0000 mg | DELAYED_RELEASE_TABLET | Freq: Every day | ORAL | Status: DC
  Administered 2021-05-15 – 2021-05-18 (×3): 81 mg via ORAL
  Filled 2021-05-14 (×3): qty 1

## 2021-05-14 MED ORDER — POLYETHYLENE GLYCOL 3350 17 G PO PACK: 17.0000 g | PACK | Freq: Every day | ORAL | Status: DC | PRN

## 2021-05-14 MED ORDER — ASPIRIN 81 MG PO CHEW
324.0000 mg | CHEWABLE_TABLET | Freq: Once | ORAL | Status: AC
  Administered 2021-05-14: 324 mg via ORAL
  Filled 2021-05-14: qty 4

## 2021-05-14 MED ORDER — ONDANSETRON HCL 4 MG/2ML IJ SOLN: 4.0000 mg | Freq: Four times a day (QID) | INTRAMUSCULAR | Status: DC | PRN

## 2021-05-14 MED ORDER — NITROGLYCERIN 0.4 MG SL SUBL: 0.4000 mg | SUBLINGUAL_TABLET | SUBLINGUAL | Status: DC | PRN

## 2021-05-14 MED ORDER — HEPARIN (PORCINE) 25000 UT/250ML-% IV SOLN
1300.0000 [IU]/h | INTRAVENOUS | Status: DC
  Administered 2021-05-14 – 2021-05-15 (×2): 1200 [IU]/h via INTRAVENOUS
  Administered 2021-05-16: 1100 [IU]/h via INTRAVENOUS
  Administered 2021-05-17 – 2021-05-18 (×2): 1300 [IU]/h via INTRAVENOUS
  Filled 2021-05-14 (×5): qty 250

## 2021-05-14 MED ORDER — PANTOPRAZOLE SODIUM 40 MG PO TBEC
80.0000 mg | DELAYED_RELEASE_TABLET | Freq: Every day | ORAL | Status: DC
  Administered 2021-05-15 – 2021-05-18 (×4): 80 mg via ORAL
  Filled 2021-05-14 (×4): qty 2

## 2021-05-14 MED ORDER — ALLOPURINOL 300 MG PO TABS
300.0000 mg | ORAL_TABLET | Freq: Every day | ORAL | Status: DC
  Administered 2021-05-15 – 2021-05-18 (×4): 300 mg via ORAL
  Filled 2021-05-14 (×5): qty 1

## 2021-05-14 MED ORDER — MORPHINE SULFATE (PF) 2 MG/ML IV SOLN
2.0000 mg | INTRAVENOUS | Status: DC | PRN
  Administered 2021-05-15: 2 mg via INTRAVENOUS
  Filled 2021-05-14: qty 1

## 2021-05-14 MED ORDER — LISINOPRIL 10 MG PO TABS
10.0000 mg | ORAL_TABLET | Freq: Every day | ORAL | Status: DC
  Administered 2021-05-15 – 2021-05-18 (×4): 10 mg via ORAL
  Filled 2021-05-14 (×4): qty 1

## 2021-05-14 MED ORDER — METOPROLOL TARTRATE 25 MG PO TABS
12.5000 mg | ORAL_TABLET | Freq: Two times a day (BID) | ORAL | Status: DC
  Administered 2021-05-14 – 2021-05-18 (×8): 12.5 mg via ORAL
  Filled 2021-05-14 (×8): qty 1

## 2021-05-14 MED ORDER — ACETAMINOPHEN 650 MG RE SUPP: 650.0000 mg | Freq: Four times a day (QID) | RECTAL | Status: DC | PRN

## 2021-05-14 MED ORDER — HEPARIN BOLUS VIA INFUSION
4000.0000 [IU] | Freq: Once | INTRAVENOUS | Status: AC
  Administered 2021-05-14: 4000 [IU] via INTRAVENOUS
  Filled 2021-05-14: qty 4000

## 2021-05-14 MED ORDER — INSULIN ASPART 100 UNIT/ML IJ SOLN
0.0000 [IU] | Freq: Three times a day (TID) | INTRAMUSCULAR | Status: DC
  Administered 2021-05-15 (×3): 5 [IU] via SUBCUTANEOUS
  Administered 2021-05-16: 7 [IU] via SUBCUTANEOUS
  Administered 2021-05-16: 3 [IU] via SUBCUTANEOUS
  Administered 2021-05-17: 2 [IU] via SUBCUTANEOUS
  Administered 2021-05-17 (×2): 5 [IU] via SUBCUTANEOUS
  Administered 2021-05-18 (×2): 3 [IU] via SUBCUTANEOUS
  Filled 2021-05-14 (×10): qty 1

## 2021-05-14 MED ORDER — ACETAMINOPHEN 325 MG PO TABS: 650.0000 mg | ORAL_TABLET | ORAL | Status: DC | PRN

## 2021-05-14 MED ORDER — OXYCODONE HCL 5 MG PO TABS
5.0000 mg | ORAL_TABLET | ORAL | Status: DC | PRN
  Administered 2021-05-14 – 2021-05-15 (×3): 5 mg via ORAL
  Filled 2021-05-14 (×3): qty 1

## 2021-05-14 MED ORDER — TRAZODONE HCL 50 MG PO TABS: 50.0000 mg | ORAL_TABLET | Freq: Every evening | ORAL | Status: DC | PRN

## 2021-05-14 MED ORDER — INSULIN ASPART 100 UNIT/ML IJ SOLN
0.0000 [IU] | Freq: Every day | INTRAMUSCULAR | Status: DC
  Administered 2021-05-14 – 2021-05-17 (×4): 2 [IU] via SUBCUTANEOUS
  Filled 2021-05-14 (×4): qty 1

## 2021-05-14 MED ORDER — NITROGLYCERIN IN D5W 200-5 MCG/ML-% IV SOLN
0.0000 ug/min | INTRAVENOUS | Status: DC
  Administered 2021-05-14 – 2021-05-15 (×2): 50 ug/min via INTRAVENOUS
  Administered 2021-05-15: 30 ug/min via INTRAVENOUS
  Filled 2021-05-14 (×2): qty 250

## 2021-05-14 MED ORDER — IOHEXOL 350 MG/ML SOLN
100.0000 mL | Freq: Once | INTRAVENOUS | Status: AC | PRN
  Administered 2021-05-14: 100 mL via INTRAVENOUS

## 2021-05-14 MED ORDER — ONDANSETRON HCL 4 MG/2ML IJ SOLN
4.0000 mg | Freq: Once | INTRAMUSCULAR | Status: AC
Start: 2021-05-14 — End: 2021-05-14
  Administered 2021-05-14: 4 mg via INTRAVENOUS
  Filled 2021-05-14: qty 2

## 2021-05-14 MED ORDER — MORPHINE SULFATE (PF) 4 MG/ML IV SOLN
4.0000 mg | Freq: Once | INTRAVENOUS | Status: AC
Start: 2021-05-14 — End: 2021-05-14
  Administered 2021-05-14: 4 mg via INTRAVENOUS
  Filled 2021-05-14: qty 1

## 2021-05-14 MED ORDER — NITROGLYCERIN 0.4 MG SL SUBL
0.4000 mg | SUBLINGUAL_TABLET | SUBLINGUAL | Status: DC | PRN
  Administered 2021-05-14 (×3): 0.4 mg via SUBLINGUAL
  Filled 2021-05-14: qty 1

## 2021-05-14 NOTE — H&P (Signed)
[facility] History and Physical  [name]:[id number] DOB: Sep 11, 1946 DOA: 05/14/2021  Referring physician: Dr. Joni Fears PCP: Marguerita Merles, MD   Chief Complaint: Chest pain  HPI: Ralph Marsh is a 75 y.o. male with history of prior DVT, hypertension, hyperlipidemia, type 2 diabetes, gout, nephrolithiasis, who presents with chest pain.  Patient reports that since yesterday afternoon after a meal he developed intense central chest pain.  Pain is been mostly constant since that time, only improved after taking a Toradol pill.  Pain radiates to both his shoulders and down both arms all the way to his hands.  He has not noticed any change in the pain with position changes.  He has stairs in his home and does not develop any chest pain going up and down them.  He sleeps with 1 pillow, has not noticed any swelling in his legs.  In the ED initial vital signs notable for mild hypertension, intermittent tachypnea and hypoxemia requiring 2 L nasal cannula.  Initial lab work-up notable for troponin of 317 that rose to 532 on recheck 2 hours later.  BMP was unremarkable as well as CBC.  COVID test was negative.  EKG showed sinus rhythm with no ischemic changes and was unchanged from prior.  Given presentation CT angiogram chest abdomen pelvis was obtained which showed no evidence of aneurysm or dissection of the aorta, CAD, cholelithiasis, and diverticulosis without evidence of diverticulitis.  ED provider contacted [facility] clinic cardiologist, he was treated with full dose aspirin and started on ACS heparin drip.  On my reevaluation after the above interventions patient reported that despite being given nitro x3 this did not relieve his pain, however morphine was effective for his pain.  When I saw him he has been started on both heparin as well as a nitroglycerin drip.  He is admitted for further management and work-up.  Review of Systems:  Pertinent positives and negative per HPI, all  others reviewed and negative  Past Medical History:  Diagnosis Date   Arthritis    BPH (benign prostatic hyperplasia)    Diabetes mellitus, type 2 ([place])    DVT (deep venous thrombosis) ([place]) 05/12/2017   left upper leg   Emphysema of lung (HCC)    mild - on xray   Fatty liver    GERD (gastroesophageal reflux disease)    Hypertension    Kidney stone    Past Surgical History:  Procedure Laterality Date   CATARACT EXTRACTION W/PHACO Right 09/11/2018   Procedure: CATARACT EXTRACTION PHACO AND INTRAOCULAR LENS PLACEMENT ([place])  RIGHT DIABETIC;  Surgeon: Leandrew Koyanagi, MD;  Location: [place];  Service: Ophthalmology;  Laterality: Right;  Diabetic - oral meds Latex sensitiviy   CATARACT EXTRACTION W/PHACO Left 10/09/2018   Procedure: CATARACT EXTRACTION PHACO AND INTRAOCULAR LENS PLACEMENT ([place])  LEFT DIABETIC;  Surgeon: Leandrew Koyanagi, MD;  Location: [place];  Service: Ophthalmology;  Laterality: Left;  Diabetic - oral meds Latex sensitivity   COLONOSCOPY WITH PROPOFOL N/A 07/28/2019   Procedure: COLONOSCOPY WITH PROPOFOL;  Surgeon: Toledo, Benay Pike, MD;  Location: [facility];  Service: Gastroenterology;  Laterality: N/A;   EXTRACORPOREAL SHOCK WAVE LITHOTRIPSY Left 01/25/2017   Procedure: EXTRACORPOREAL SHOCK WAVE LITHOTRIPSY (ESWL);  Surgeon: Nickie Retort, MD;  Location: [facility];  Service: Urology;  Laterality: Left;   LASER OF PROSTATE W/ GREEN LIGHT PVP  2008   Dr. Quillian Quince   LUMBAR LAMINECTOMY     TONSILLECTOMY AND ADENOIDECTOMY  URETEROSCOPY WITH HOLMIUM LASER LITHOTRIPSY  2004   Social History:  reports that he quit smoking about 27 years ago. He has never used smokeless tobacco. He reports current alcohol use. He reports that he does not use drugs.  Allergies  Allergen Reactions   Augmentin [Amoxicillin-Pot Clavulanate] Anaphylaxis   Latex Other (See Comments)    Nasal inflammation   Other Anaphylaxis   Solifenacin Other (See  Comments)   Sulfa Antibiotics Anaphylaxis    Family History  Problem Relation Age of Onset   Kidney disease Neg Hx    Kidney cancer Neg Hx    Prostate cancer Neg Hx      Prior to Admission medications   Medication Sig Start Date End Date Taking? Authorizing Provider  allopurinol (ZYLOPRIM) 300 MG tablet Take 300 mg by mouth daily.    [provider]  aspirin EC 81 MG tablet Take 81 mg by mouth daily.    [provider]  Cholecalciferol 1000 UNITS tablet Take 1,000 Units by mouth daily.    [provider]  CINNAMON PO Take 140 mg by mouth daily.    [provider]  diclofenac sodium (VOLTAREN) 1 % GEL Apply 2 g topically as needed.    [provider]  Fish Oil-Cholecalciferol (FISH OIL + D3 PO) Take 1 capsule by mouth daily.     [provider]  Hylan (SYNVISC IX) Inject 6 mLs into the articular space every 6 (six) months. Both knees    [provider]  ketorolac (TORADOL) 10 MG tablet Take 1 tablet (10 mg total) by mouth every 6 (six) hours as needed for moderate pain or severe pain. 03/14/21   Hinda Kehr, MD  lisinopril (PRINIVIL,ZESTRIL) 10 MG tablet Take 10 mg by mouth daily.    [provider]  metFORMIN (GLUCOPHAGE) 500 MG tablet Take 1 tablet (500 mg total) by mouth 2 (two) times daily with a meal. Patient taking differently: Take 500 mg by mouth 2 (two) times daily with a meal. 2 tabs AM, 1 tab PM 11/12/17 11/12/18  Lavonia Drafts, MD  morphine (MS CONTIN) 15 MG 12 hr tablet Take 15 mg by mouth every 12 (twelve) hours.    [provider]  morphine (MSIR) 15 MG tablet Take 15 mg by mouth daily.    [provider]  Multiple Vitamin (MULTIVITAMIN) tablet Take 1 tablet by mouth daily.    [provider]  Nutritional Supplements (JUICE PLUS FIBRE) LIQD Take 1 Dose by mouth daily.    [provider]  ondansetron (ZOFRAN ODT) 4 MG disintegrating tablet Allow 1-2 tablets to dissolve in  your mouth every 8 hours as needed for nausea/vomiting 03/14/21   Hinda Kehr, MD  pantoprazole (PROTONIX) 40 MG tablet Take 40 mg by mouth daily.    [provider]   Physical Exam: Vitals:   05/14/21 1657 05/14/21 1700 05/14/21 1715 05/14/21 1730  BP: 124/83 (!) 133/110 (!) 136/99 121/81  Pulse: 100 95 99 (!) 104  Resp: _0 (!) 23  Temp:      TempSrc:      SpO2: (!) 89% 90% 92% 95%  Weight:      Height:        Wt Readings from Last 3 Encounters:  05/14/21 93 kg  03/14/21 93.4 kg  07/28/19 94.3 kg     General:  Appears calm and comfortable Eyes: PERRL, normal lids, irises & conjunctiva ENT: grossly normal hearing, lips & tongue Neck: no LAD,  masses or thyromegaly Cardiovascular: RRR, no m/r/g. No LE edema. Telemetry: SR, no arrhythmias  Respiratory: CTA bilaterally, no w/r/r. Normal respiratory effort. Abdomen: soft, ntnd Skin: no rash or induration seen on limited exam Musculoskeletal: grossly normal tone BUE/BLE Psychiatric: grossly normal mood and affect, speech fluent and appropriate Neurologic: grossly non-focal.          Labs on Admission:  Basic Metabolic Panel: Recent Labs  Lab 05/14/21 1403  NA 131*  K 4.0  CL 99  CO2 19*  GLUCOSE 282*  BUN 19  CREATININE 1.11  CALCIUM 8.9   Liver Function Tests: No results for input(s): AST, ALT, ALKPHOS, BILITOT, PROT, ALBUMIN in the last 168 hours. No results for input(s): LIPASE, AMYLASE in the last 168 hours. No results for input(s): AMMONIA in the last 168 hours. CBC: Recent Labs  Lab 05/14/21 1403  WBC 7.7  HGB 15.7  HCT 45.0  MCV 91.1  PLT 171   Cardiac Enzymes: No results for input(s): CKTOTAL, CKMB, CKMBINDEX, TROPONINI in the last 168 hours.  BNP (last 3 results) No results for input(s): BNP in the last 8760 hours.  ProBNP (last 3 results) No results for input(s): PROBNP in the last 8760 hours.  CBG: No results for input(s): GLUCAP in the last 168 hours.  Radiological  Exams on Admission: DG Chest 2 View  Result Date: 05/14/2021 CLINICAL DATA:  Chest pain EXAM: CHEST - 2 VIEW COMPARISON:  03/14/2021 FINDINGS: Heart and mediastinal contours are within normal limits. No focal opacities or effusions. No acute bony abnormality. IMPRESSION: No active cardiopulmonary disease. Electronically Signed   By: Kevin  Dover M.D.   On: 05/14/2021 14:34   CT Angio Chest/Abd/Pel for Dissection W and/or Wo Contrast  Result Date: 05/14/2021 CLINICAL DATA:  Chest pain radiating into arms and back, shortness of breath, nausea, diaphoresis EXAM: CT ANGIOGRAPHY CHEST, ABDOMEN AND PELVIS TECHNIQUE: Non-contrast CT of the chest was initially obtained. Multidetector CT imaging through the chest, abdomen and pelvis was performed using the standard protocol during bolus administration of intravenous contrast. Multiplanar reconstructed images and MIPs were obtained and reviewed to evaluate the vascular anatomy. CONTRAST:  100mL OMNIPAQUE IOHEXOL 350 MG/ML SOLN COMPARISON:  CT abdomen pelvis, 03/14/2021 FINDINGS: CTA CHEST FINDINGS Cardiovascular: Preferential opacification of the thoracic aorta. Normal contour and caliber of the thoracic aorta. No evidence of aneurysm, dissection, or other acute aortic pathology. Mild mixed calcific atherosclerosis. Normal heart size. Three-vessel coronary artery calcifications. No pericardial effusion. Mediastinum/Nodes: No enlarged mediastinal, hilar, or axillary lymph nodes. Thyroid gland, trachea, and esophagus demonstrate no significant findings. Lungs/Pleura: Lungs are clear. No pleural effusion or pneumothorax. Musculoskeletal: No chest wall abnormality. No acute or significant osseous findings. Review of the MIP images confirms the above findings. CTA ABDOMEN AND PELVIS FINDINGS VASCULAR Normal contour and caliber of the abdominal aorta. No evidence of aneurysm, dissection, or other acute aortic pathology. Moderate mixed calcific atherosclerosis. Standard  branching pattern of the abdominal aorta with solitary bilateral renal arteries. Review of the MIP images confirms the above findings. NON-VASCULAR Hepatobiliary: No solid liver abnormality is seen. Multiple gallstones in the gallbladder. No gallbladder wall thickening, or biliary dilatation. Pancreas: Unremarkable. No pancreatic ductal dilatation or surrounding inflammatory changes. Spleen: Normal in size without significant abnormality. Adrenals/Urinary Tract: Adrenal glands are unremarkable. Kidneys are normal, without renal calculi, solid lesion, or hydronephrosis. Bladder is unremarkable. Stomach/Bowel: Stomach is within normal limits. Appendix appears normal. No evidence of bowel wall thickening, distention, or inflammatory changes. Pancolonic diverticulosis, severe in the   sigmoid. Lymphatic: No enlarged abdominal or pelvic lymph nodes. Reproductive: No mass or other significant abnormality. Other: No abdominal wall hernia or abnormality. No abdominopelvic ascites. Musculoskeletal: No acute or significant osseous findings. Review of the MIP images confirms the above findings. IMPRESSION: 1. Normal contour and caliber of the thoracic and abdominal aorta. No evidence of aneurysm, dissection, or other acute aortic pathology. Mild to moderate mixed calcific atherosclerosis. 2. Coronary artery disease. 3. Cholelithiasis. 4. 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 No evidence of acute diverticulitis. Electronically Signed   By: Alex  Bibbey M.D.   On: 05/14/2021 16:55    EKG: Independently reviewed.  Sinus rhythm, irregular, no PR or ST segment changes.  Unchanged from prior.  Assessment/Plan Active Problems:   Obstructive uropathy   Acute deep vein thrombosis (DVT) of femoral vein of left lower extremity (HCC)   Essential hypertension   Hyperlipidemia   NSTEMI (non-ST elevated myocardial infarction) (HCC)   Type 2 diabetes mellitus without complications (HCC)   Ralph Marsh is a 75 y.o.  male with history of prior DVT, hypertension, hyperlipidemia, type 2 diabetes, gout, nephrolithiasis, who presents with chest pain.  #NSTEMI #CAD Patient with known risk factors for CAD.  Many elements of his history are typical of CAD and angina, however did not receive relief from nitroglycerin.  Differential still includes pericarditis and myocarditis, although his EKG does not show any signs of this and no pericardial rub heard on exam.  Does have a history of PE but no evidence of such on imaging. - Heparin per ACS pharmacy protocol - Continue nitroglycerin gtt. as blood pressure allows - IV pain meds as needed - Start metoprolol 12.5 mg twice daily - No statin, has had myalgias to multiple different medications in the past - Continue daily aspirin - TTE - Check CRP, ESR - N.p.o. at midnight - Follow-up cardiology recommendations in a.m.  #Known Medical Problems Gout-continue allopurinol GERD-continue PPI Hypertension-continue lisinopril DM2-hold home metformin, 4 times daily fingersticks and sensitive sliding scale insulin  Code Status: Full code, confirmed DVT Prophylaxis: Lovenox Family Communication: Patient and his wife updated at bedside Disposition Plan: Inpatient, Progressive Cardiac   Time spent: 50 min  Matthew M Eckstat MD/MPH [facility]  Note:  This document was prepared using Dragon voice recognition software and may include unintentional dictation errors.   

## 2021-05-14 NOTE — Consult Note (Signed)
ANTICOAGULATION CONSULT NOTE   Pharmacy Consult for Heparin Indication: chest pain/ACS  Patient Measurements: Height: 6\' 1"  (185.4 cm) Weight: 93 kg (205 lb) IBW/kg (Calculated) : 79.9 Heparin Dosing Weight: 93 kg   Vital Signs: Temp: 98.4 F (36.9 C) (07/09 1359) Temp Source: Oral (07/09 1359) BP: 92/63 (07/09 1945) Pulse Rate: 82 (07/09 1945)  Labs: Recent Labs    05/14/21 1403 05/14/21 1545 05/14/21 1701  HGB 15.7  --   --   HCT 45.0  --   --   PLT 171  --   --   APTT  --   --  29  LABPROT  --   --  13.6  INR  --   --  1.0  CREATININE 1.11  --   --   TROPONINIHS 317* 532*  --      Estimated Creatinine Clearance: 63 mL/min (by C-G formula based on SCr of 1.11 mg/dL).   Medical History: Past Medical History:  Diagnosis Date   Arthritis    BPH (benign prostatic hyperplasia)    Diabetes mellitus, type 2 (HCC)    DVT (deep venous thrombosis) ([place]) 05/12/2017   left upper leg   Emphysema of lung (HCC)    mild - on xray   Fatty liver    GERD (gastroesophageal reflux disease)    Hypertension    Kidney stone     Medications:  No PTA anticoagulation  Assessment: 75 y.o. male with a past history of DVT, HTN, HLD, and T2DM who comes ED complaining of chest pain radiating to both arms and the upper back. Troponin trending up from 317 to 532. Pharmacy has been consulted for heparin dosing for ACS.  H/H and plts WNL  Goal of Therapy:  anti-Xa level 0.3-0.7 units/ml Monitor platelets by anticoagulation protocol: Yes   Plan:  anti-Xa level is therapeutic: continue infusion at 1200 units/hr Re-check anti-Xa level in 8 hours  Continue to monitor H&H and platelets  Dallie Piles, PharmD 05/14/2021,9:45 PM

## 2021-05-14 NOTE — ED Triage Notes (Signed)
Pt reports that he developed chest pain on both sides of his chest, down both arms and into his back. He feels [facility] because of the pain. He is just starting to get nauseated with diaphoresis.

## 2021-05-14 NOTE — ED Notes (Signed)
ED Provider [place] at bedside. 

## 2021-05-14 NOTE — ED Notes (Signed)
Patient transported to CT 

## 2021-05-14 NOTE — ED Provider Notes (Signed)
[facility] Emergency Department Provider Note  ____________________________________________  Time seen: Approximately 4:46 PM  I have reviewed the triage vital signs and the nursing notes.   HISTORY  Chief Complaint Chest Pain    HPI Ralph Marsh is a 75 y.o. male with a past history of diabetes DVT hypertension kidney stones who comes ED complaining of sudden onset of central chest pain radiating to both arms and the upper back that started last night after dinner.  It has been constant, waxing and waning, worse lying down and better sitting upright.  Not exertional, not pleuritic.  Not alleviated by NSAIDs, Maalox, or antacids.  Currently 7/10 in intensity.  Associated with nausea diaphoresis and shortness of breath.    Past Medical History:  Diagnosis Date   Arthritis    BPH (benign prostatic hyperplasia)    Diabetes mellitus, type 2 (HCC)    DVT (deep venous thrombosis) ([place]) 05/12/2017   left upper leg   Emphysema of lung (HCC)    mild - on xray   Fatty liver    GERD (gastroesophageal reflux disease)    Hypertension    Kidney stone      Patient Active Problem List   Diagnosis Date Noted   Essential hypertension 06/04/2017   GERD (gastroesophageal reflux disease) 06/04/2017   Hyperlipidemia 06/04/2017   Acute deep vein thrombosis (DVT) of femoral vein of left lower extremity ([place]) 05/13/2017   Obstructive uropathy 01/20/2017     Past Surgical History:  Procedure Laterality Date   CATARACT EXTRACTION W/PHACO Right 09/11/2018   Procedure: CATARACT EXTRACTION PHACO AND INTRAOCULAR LENS PLACEMENT ([place])  RIGHT DIABETIC;  Surgeon: Leandrew Koyanagi, MD;  Location: [place];  Service: Ophthalmology;  Laterality: Right;  Diabetic - oral meds Latex sensitiviy   CATARACT EXTRACTION W/PHACO Left 10/09/2018   Procedure: CATARACT EXTRACTION PHACO AND INTRAOCULAR LENS PLACEMENT ([place])  LEFT DIABETIC;  Surgeon: Leandrew Koyanagi, MD;   Location: [place];  Service: Ophthalmology;  Laterality: Left;  Diabetic - oral meds Latex sensitivity   COLONOSCOPY WITH PROPOFOL N/A 07/28/2019   Procedure: COLONOSCOPY WITH PROPOFOL;  Surgeon: [place], Benay Pike, MD;  Location: [facility];  Service: Gastroenterology;  Laterality: N/A;   EXTRACORPOREAL SHOCK WAVE LITHOTRIPSY Left 01/25/2017   Procedure: EXTRACORPOREAL SHOCK WAVE LITHOTRIPSY (ESWL);  Surgeon: Nickie Retort, MD;  Location: [facility];  Service: Urology;  Laterality: Left;   LASER OF PROSTATE W/ GREEN LIGHT PVP  2008   Dr. Quillian Quince   LUMBAR LAMINECTOMY     TONSILLECTOMY AND ADENOIDECTOMY     URETEROSCOPY WITH HOLMIUM LASER LITHOTRIPSY  2004     Prior to Admission medications   Medication Sig Start Date End Date Taking? Authorizing Provider  allopurinol (ZYLOPRIM) 300 MG tablet Take 300 mg by mouth daily.    [provider]  aspirin EC 81 MG tablet Take 81 mg by mouth daily.    [provider]  Cholecalciferol 1000 UNITS tablet Take 1,000 Units by mouth daily.    [provider]  CINNAMON PO Take 140 mg by mouth daily.    [provider]  diclofenac sodium (VOLTAREN) 1 % GEL Apply 2 g topically as needed.    [provider]  Fish Oil-Cholecalciferol (FISH OIL + D3 PO) Take 1 capsule by mouth daily.     [provider]  Hylan (SYNVISC IX) Inject 6 mLs into the articular space every 6 (six) months. Both knees    [provider]  ketorolac (  TORADOL) 10 MG tablet Take 1 tablet (10 mg total) by mouth every 6 (six) hours as needed for moderate pain or severe pain. 03/14/21   Hinda Kehr, MD  lisinopril (PRINIVIL,ZESTRIL) 10 MG tablet Take 10 mg by mouth daily.    [provider]  metFORMIN (GLUCOPHAGE) 500 MG tablet Take 1 tablet (500 mg total) by mouth 2 (two) times daily with a meal. Patient taking differently: Take 500 mg by mouth 2 (two) times daily with a meal. 2 tabs AM, 1 tab PM 11/12/17  11/12/18  Lavonia Drafts, MD  morphine (MS CONTIN) 15 MG 12 hr tablet Take 15 mg by mouth every 12 (twelve) hours.    [provider]  morphine (MSIR) 15 MG tablet Take 15 mg by mouth daily.    [provider]  Multiple Vitamin (MULTIVITAMIN) tablet Take 1 tablet by mouth daily.    [provider]  Nutritional Supplements (JUICE PLUS FIBRE) LIQD Take 1 Dose by mouth daily.    [provider]  ondansetron (ZOFRAN ODT) 4 MG disintegrating tablet Allow 1-2 tablets to dissolve in your mouth every 8 hours as needed for nausea/vomiting 03/14/21   Hinda Kehr, MD  pantoprazole (PROTONIX) 40 MG tablet Take 40 mg by mouth daily.    [provider]     Allergies Augmentin [amoxicillin-pot clavulanate], Latex, Other, Solifenacin, and Sulfa antibiotics   Family History  Problem Relation Age of Onset   Kidney disease Neg Hx    Kidney cancer Neg Hx    Prostate cancer Neg Hx     Social History Social History   Tobacco Use   Smoking status: Former    Pack years: 0.00    Types: Cigarettes    Quit date: 1995    Years since quitting: 27.5   Smokeless tobacco: Never  Vaping Use   Vaping Use: Never used  Substance Use Topics   Alcohol use: Yes    Alcohol/week: 0.0 standard drinks    Comment: Holidays only   Drug use: No    Review of Systems  Constitutional:   No fever or chills.  ENT:   No sore throat. No rhinorrhea. Cardiovascular:   Positive chest pain as above without syncope. Respiratory:   No dyspnea or cough. Gastrointestinal:   Negative for abdominal pain, vomiting and diarrhea.  Musculoskeletal:   Negative for focal pain or swelling All other systems reviewed and are negative except as documented above in ROS and HPI.  ____________________________________________   PHYSICAL EXAM:  VITAL SIGNS: ED Triage Vitals [05/14/21 1359]  Enc Vitals Group     BP (!) 139/96     Pulse Rate 84     Resp 20     Temp 98.4 F (36.9 C)     Temp  Source Oral     SpO2 94 %     Weight 205 lb (93 kg)     Height 6\' 1"  (1.854 m)     Head Circumference      Peak Flow      Pain Score 9     Pain Loc      Pain Edu?      Excl. in [place]?     Vital signs reviewed, nursing assessments reviewed.   Constitutional:   Alert and oriented. Non-toxic appearance. Eyes:   Conjunctivae are normal. EOMI. PERRL. ENT      Head:   Normocephalic and atraumatic.      Nose:   Wearing a mask.  Mouth/Throat:   Wearing a mask.      Neck:   No meningismus. Full ROM. Hematological/Lymphatic/Immunilogical:   No cervical lymphadenopathy. Cardiovascular:   RRR. Symmetric bilateral radial and DP pulses.  No murmurs. Cap refill less than 2 seconds. Respiratory:   Normal respiratory effort without tachypnea/retractions. Breath sounds are clear and equal bilaterally. No wheezes/rales/rhonchi. Gastrointestinal:   Soft and nontender. Non distended. There is no CVA tenderness.  No rebound, rigidity, or guarding. Genitourinary:   deferred Musculoskeletal:   Normal range of motion in all extremities. No joint effusions.  No lower extremity tenderness.  No edema. Neurologic:   Normal speech and language.  Motor grossly intact. No acute focal neurologic deficits are appreciated.  Skin:    Skin is warm, dry and intact. No rash noted.  No petechiae, purpura, or bullae.  ____________________________________________    LABS (pertinent positives/negatives) (all labs ordered are listed, but only abnormal results are displayed) Labs Reviewed  BASIC METABOLIC PANEL - Abnormal; Notable for the following components:      Result Value   Sodium 131 (*)    CO2 19 (*)    Glucose, Bld 282 (*)    All other components within normal limits  TROPONIN I (HIGH SENSITIVITY) - Abnormal; Notable for the following components:   Troponin I (High Sensitivity) 317 (*)    All other components within normal limits  TROPONIN I (HIGH SENSITIVITY) - Abnormal; Notable for the following  components:   Troponin I (High Sensitivity) 532 (*)    All other components within normal limits  RESP PANEL BY RT-PCR (FLU A&B, COVID) ARPGX2  CBC  APTT  PROTIME-INR   ____________________________________________   EKG  Interpreted by me Sinus rhythm rate of 66, normal axis and intervals.  Normal QRS ST segments and T waves.  No ischemic changes  ____________________________________________    RADIOLOGY  DG Chest 2 View  Result Date: 05/14/2021 CLINICAL DATA:  Chest pain EXAM: CHEST - 2 VIEW COMPARISON:  03/14/2021 FINDINGS: Heart and mediastinal contours are within normal limits. No focal opacities or effusions. No acute bony abnormality. IMPRESSION: No active cardiopulmonary disease. Electronically Signed   By: Rolm Baptise M.D.   On: 05/14/2021 14:34   CT Angio Chest/Abd/Pel for Dissection W and/or Wo Contrast  Result Date: 05/14/2021 CLINICAL DATA:  Chest pain radiating into arms and back, shortness of breath, nausea, diaphoresis EXAM: CT ANGIOGRAPHY CHEST, ABDOMEN AND PELVIS TECHNIQUE: Non-contrast CT of the chest was initially obtained. Multidetector CT imaging through the chest, abdomen and pelvis was performed using the standard protocol during bolus administration of intravenous contrast. Multiplanar reconstructed images and MIPs were obtained and reviewed to evaluate the vascular anatomy. CONTRAST:  144mL OMNIPAQUE IOHEXOL 350 MG/ML SOLN COMPARISON:  CT abdomen pelvis, 03/14/2021 FINDINGS: CTA CHEST FINDINGS Cardiovascular: Preferential opacification of the thoracic aorta. Normal contour and caliber of the thoracic aorta. No evidence of aneurysm, dissection, or other acute aortic pathology. Mild mixed calcific atherosclerosis. Normal heart size. Three-vessel coronary artery calcifications. No pericardial effusion. Mediastinum/Nodes: No enlarged mediastinal, hilar, or axillary lymph nodes. Thyroid gland, trachea, and esophagus demonstrate no significant findings. Lungs/Pleura:  Lungs are clear. No pleural effusion or pneumothorax. Musculoskeletal: No chest wall abnormality. No acute or significant osseous findings. Review of the MIP images confirms the above findings. CTA ABDOMEN AND PELVIS FINDINGS VASCULAR Normal contour and caliber of the abdominal aorta. No evidence of aneurysm, dissection, or other acute aortic pathology. Moderate mixed calcific atherosclerosis. Standard branching pattern of the abdominal aorta with  solitary bilateral renal arteries. Review of the MIP images confirms the above findings. NON-VASCULAR Hepatobiliary: No solid liver abnormality is seen. Multiple gallstones in the gallbladder. No gallbladder wall thickening, or biliary dilatation. Pancreas: Unremarkable. No pancreatic ductal dilatation or surrounding inflammatory changes. Spleen: Normal in size without significant abnormality. Adrenals/Urinary Tract: Adrenal glands are unremarkable. Kidneys are normal, without renal calculi, solid lesion, or hydronephrosis. Bladder is unremarkable. Stomach/Bowel: Stomach is within normal limits. Appendix appears normal. No evidence of bowel wall thickening, distention, or inflammatory changes. 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 Lymphatic: No enlarged abdominal or pelvic lymph nodes. Reproductive: No mass or other significant abnormality. Other: No abdominal wall hernia or abnormality. No abdominopelvic ascites. Musculoskeletal: No acute or significant osseous findings. Review of the MIP images confirms the above findings. IMPRESSION: 1. Normal contour and caliber of the thoracic and abdominal aorta. No evidence of aneurysm, dissection, or other acute aortic pathology. Mild to moderate mixed calcific atherosclerosis. 2. Coronary artery disease. 3. Cholelithiasis. 4. 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 No evidence of acute diverticulitis. Electronically Signed   By: Eddie Candle M.D.   On: 05/14/2021 16:55     ____________________________________________   PROCEDURES .Critical Care  Date/Time: 05/14/2021 4:57 PM Performed by: Carrie Mew, MD Authorized by: Carrie Mew, MD   Critical care provider statement:    Critical care time (minutes):  33   Critical care time was exclusive of:  Separately billable procedures and treating other patients   Critical care was necessary to treat or prevent imminent or life-threatening deterioration of the following conditions:  Cardiac failure   Critical care was time spent personally by me on the following activities:  Development of treatment plan with patient or surrogate, discussions with consultants, evaluation of patient's response to treatment, examination of patient, obtaining history from patient or surrogate, ordering and performing treatments and interventions, ordering and review of laboratory studies, ordering and review of radiographic studies, pulse oximetry, re-evaluation of patient's condition and review of old charts  ____________________________________________  DIFFERENTIAL DIAGNOSIS   Aortic dissection, non-STEMI, GERD, pericarditis, unlikely pulmonary embolism pneumothorax pneumonia pulmonary edema  CLINICAL IMPRESSION / ASSESSMENT AND PLAN / ED COURSE  Medications ordered in the ED: Medications  nitroGLYCERIN (NITROSTAT) SL tablet 0.4 mg (0.4 mg Sublingual Given 05/14/21 1650)  heparin bolus via infusion 4,000 Units (has no administration in time range)  heparin ADULT infusion 100 units/mL (25000 units/238mL) (has no administration in time range)  iohexol (OMNIPAQUE) 350 MG/ML injection 100 mL (100 mLs Intravenous Contrast Given 05/14/21 1613)  aspirin chewable tablet 324 mg (324 mg Oral Given 05/14/21 1649)    Pertinent labs & imaging results that were available during my care of the patient were reviewed by me and considered in my medical decision making (see chart for details).  Ralph Marsh was evaluated in Emergency  Department on 05/14/2021 for the symptoms described in the history of present illness. He was evaluated in the context of the global COVID-19 pandemic, which necessitated consideration that the patient might be at risk for infection with the SARS-CoV-2 virus that causes COVID-19. Institutional protocols and algorithms that pertain to the evaluation of patients at risk for COVID-19 are in a state of rapid change based on information released by regulatory bodies including the CDC and federal and state organizations. These policies and algorithms were followed during the patient's care in the ED.   Patient presents with severe precordial pain most concerning for non-STEMI.  Will obtain CTA chest to rule out aortic  dissection prior to anticoagulation  ----------------------------------------- 4:56 PM on 05/14/2021 ----------------------------------------- CT angiogram images viewed by me, no dissection or aortic aneurysm.  No obvious pulmonary embolism or lung pathology.  Troponin elevated and trending up.  Will give aspirin and heparin.  We will get response to sublingual nitros to alleviate pain.  Cardiology Dr. Nehemiah Massed aware.  Plan to admit to hospitalist for NSTEMI care  Clinical Course as of 05/14/21 1658  Sat May 14, 2021  1645 CT Angio Chest/Abd/Pel for Dissection W and/or Wo Contrast [PS]    Clinical Course User Index [PS] Carrie Mew, MD     ____________________________________________   FINAL CLINICAL IMPRESSION(S) / ED DIAGNOSES    Final diagnoses:  NSTEMI (non-ST elevated myocardial infarction) ([place])  Type 2 diabetes mellitus without complication, without long-term current use of insulin [facility])     ED Discharge Orders     None       Portions of this note were generated with dragon dictation software. Dictation errors may occur despite best attempts at proofreading.    Carrie Mew, MD 05/14/21 [phone]

## 2021-05-14 NOTE — Consult Note (Signed)
ANTICOAGULATION CONSULT NOTE   Pharmacy Consult for Heparin Indication: chest pain/ACS  Allergies  Allergen Reactions   Augmentin [Amoxicillin-Pot Clavulanate] Anaphylaxis   Latex Other (See Comments)    Nasal inflammation   Other Anaphylaxis   Solifenacin Other (See Comments)   Sulfa Antibiotics Anaphylaxis    Patient Measurements: Height: 6\' 1"  (185.4 cm) Weight: 93 kg (205 lb) IBW/kg (Calculated) : 79.9 Heparin Dosing Weight: 93 kg   Vital Signs: Temp: 98.4 F (36.9 C) (07/09 1359) Temp Source: Oral (07/09 1359) BP: 148/87 (07/09 1600) Pulse Rate: 72 (07/09 1600)  Labs: Recent Labs    05/14/21 1403 05/14/21 1545  HGB 15.7  --   HCT 45.0  --   PLT 171  --   CREATININE 1.11  --   TROPONINIHS 317* 532*    Estimated Creatinine Clearance: 63 mL/min (by C-G formula based on SCr of 1.11 mg/dL).   Medical History: Past Medical History:  Diagnosis Date   Arthritis    BPH (benign prostatic hyperplasia)    Diabetes mellitus, type 2 (HCC)    DVT (deep venous thrombosis) ([place]) 05/12/2017   left upper leg   Emphysema of lung (HCC)    mild - on xray   Fatty liver    GERD (gastroesophageal reflux disease)    Hypertension    Kidney stone     Medications:  No PTA anticoagulation  Assessment: 75 y.o. male with a past history of DVT, HTN, HLD, and T2DM who comes ED complaining of chest pain radiating to both arms and the upper back. Troponin trending up from 317 to 532. Pharmacy has been consulted for heparin dosing for ACS.  H/H and plts WNL Heparin Dosing Weight: 93 kg   Goal of Therapy:  Heparin level 0.3-0.7 units/ml Monitor platelets by anticoagulation protocol: Yes   Plan:  Give 4000 units bolus x 1 Start heparin infusion at 1200 units/hr Check anti-Xa level in 8 hours and daily while on heparin Continue to monitor H&H and platelets  Darnelle Bos, PharmD 05/14/2021,4:29 PM

## 2021-05-15 ENCOUNTER — Inpatient Hospital Stay
Admit: 2021-05-15 | Discharge: 2021-05-15 | Disposition: A | Discharge status: Home / Self Care | Payer: No Typology Code available for payment source | Attending: Family Medicine | Admitting: Family Medicine

## 2021-05-15 ENCOUNTER — Encounter: Payer: Self-pay | Admitting: Family Medicine

## 2021-05-15 LAB — LIPID PANEL
Cholesterol: 141 mg/dL (ref 0–200)
HDL: 34 mg/dL — ABNORMAL LOW (ref >40)
LDL Cholesterol: 68 mg/dL (ref 0–99)
Total CHOL/HDL Ratio: 4.1 RATIO
Triglycerides: 196 mg/dL — ABNORMAL HIGH (ref <150)
VLDL: 39 mg/dL (ref 0–40)

## 2021-05-15 LAB — BASIC METABOLIC PANEL
Anion gap: 7 (ref 5–15)
BUN: 17 mg/dL (ref 8–23)
CO2: 26 mmol/L (ref 22–32)
Calcium: 8.5 mg/dL — ABNORMAL LOW (ref 8.9–10.3)
Chloride: 100 mmol/L (ref 98–111)
Creatinine, Ser: 0.99 mg/dL (ref 0.61–1.24)
GFR, Estimated: >60 mL/min (ref >60)
Glucose, Bld: 273 mg/dL — ABNORMAL HIGH (ref 70–99)
Potassium: 3.8 mmol/L (ref 3.5–5.1)
Sodium: 133 mmol/L — ABNORMAL LOW (ref 135–145)

## 2021-05-15 LAB — CBC
HCT: 39.1 % (ref 39.0–52.0)
Hemoglobin: 13.9 g/dL (ref 13.0–17.0)
MCH: 32.2 pg (ref 26.0–34.0)
MCHC: 35.5 g/dL (ref 30.0–36.0)
MCV: 90.5 fL (ref 80.0–100.0)
Platelets: 154 10*3/uL (ref 150–400)
RBC: 4.32 MIL/uL (ref 4.22–5.81)
RDW: 13 % (ref 11.5–15.5)
WBC: 11.4 10*3/uL — ABNORMAL HIGH (ref 4.0–10.5)
nRBC: 0 % (ref 0.0–0.2)

## 2021-05-15 LAB — HEPARIN LEVEL (UNFRACTIONATED)
Heparin Unfractionated: 0.59 IU/mL (ref 0.30–0.70)
Heparin Unfractionated: 0.71 IU/mL — ABNORMAL HIGH (ref 0.30–0.70)

## 2021-05-15 LAB — ECHOCARDIOGRAM COMPLETE
AR max vel: 2.8 cm2
AV Peak grad: 3.7 mmHg
Ao pk vel: 0.96 m/s
Area-P 1/2: 4.96 cm2
Height: 73 in
S' Lateral: 2.75 cm
Weight: 3280 oz

## 2021-05-15 LAB — C-REACTIVE PROTEIN: CRP: <0.5 mg/dL (ref <1.0)

## 2021-05-15 LAB — SEDIMENTATION RATE: Sed Rate: 6 mm/hr (ref 0–20)

## 2021-05-15 LAB — GLUCOSE, CAPILLARY
Glucose-Capillary: 277 mg/dL — ABNORMAL HIGH (ref 70–99)
Glucose-Capillary: 276 mg/dL — ABNORMAL HIGH (ref 70–99)
Glucose-Capillary: 267 mg/dL — ABNORMAL HIGH (ref 70–99)
Glucose-Capillary: 216 mg/dL — ABNORMAL HIGH (ref 70–99)

## 2021-05-15 LAB — HEMOGLOBIN A1C
Hgb A1c MFr Bld: 8.2 % — ABNORMAL HIGH (ref 4.8–5.6)
Mean Plasma Glucose: 188.64 mg/dL

## 2021-05-15 MED ORDER — PERFLUTREN LIPID MICROSPHERE
1.0000 mL | INTRAVENOUS | Status: AC | PRN
Start: 2021-05-15 — End: 2021-05-15
  Administered 2021-05-15: 6 mL via INTRAVENOUS
  Filled 2021-05-15: qty 10

## 2021-05-15 MED ORDER — SODIUM CHLORIDE 0.9% FLUSH
3.0000 mL | Freq: Two times a day (BID) | INTRAVENOUS | Status: DC
  Administered 2021-05-16 – 2021-05-18 (×2): 3 mL via INTRAVENOUS

## 2021-05-15 NOTE — Consult Note (Signed)
ANTICOAGULATION CONSULT NOTE   Pharmacy Consult for Heparin Indication: chest pain/ACS  Patient Measurements: Height: 6\' 1"  (185.4 cm) Weight: 93 kg (205 lb) IBW/kg (Calculated) : 79.9 Heparin Dosing Weight: 93 kg   Vital Signs: Temp: 98.2 F (36.8 C) (07/10 1946) Temp Source: Oral (07/10 1619) BP: 108/67 (07/10 1946) Pulse Rate: 91 (07/10 1946)  Labs: Recent Labs    05/14/21 1403 05/14/21 1545 05/14/21 1701 05/15/21 0049 05/15/21 0603 05/15/21 2043  HGB 15.7  --   --   --  13.9  --   HCT 45.0  --   --   --  39.1  --   PLT 171  --   --   --  154  --   APTT  --   --  29  --   --   --   LABPROT  --   --  13.6  --   --   --   INR  --   --  1.0  --   --   --   HEPARINUNFRC  --   --   --  0.59  --  0.71*  CREATININE 1.11  --   --   --  0.99  --   TROPONINIHS 317* 532*  --   --   --   --      Estimated Creatinine Clearance: 70.6 mL/min (by C-G formula based on SCr of 0.99 mg/dL).   Medical History: Past Medical History:  Diagnosis Date   Arthritis    BPH (benign prostatic hyperplasia)    Diabetes mellitus, type 2 (HCC)    DVT (deep venous thrombosis) ([place]) 05/12/2017   left upper leg   Emphysema of lung (HCC)    mild - on xray   Fatty liver    GERD (gastroesophageal reflux disease)    Hypertension    Kidney stone     Medications:  No PTA anticoagulation  Assessment: 75 y.o. male with a past history of DVT, HTN, HLD, and T2DM who comes ED complaining of chest pain radiating to both arms and the upper back. Troponin trending up from 317 to 532. Pharmacy has been consulted for heparin dosing for ACS.  H/H and plts WNL  Date Time HL Rate/comment 07/10 0049 0.59 1200 units/hr 07/10 2043 0.71 1200 units/hr >> 1100 units/hr  Goal of Therapy:  anti-Xa level 0.3-0.7 units/ml Monitor platelets by anticoagulation protocol: Yes   Plan:  anti-Xa level is slightly supratherapeutic, decrease heparin infusion to 1100 units/hr Re-check anti-Xa level in 8 hours   Continue to monitor H&H and platelets  Darnelle Bos, PharmD 05/15/2021,9:13 PM

## 2021-05-15 NOTE — Consult Note (Signed)
[facility] Consultation Note  Patient ID: Ralph Marsh, MRN: [id number], DOB/AGE: [birth year] 75 y.o. Admit date: 05/14/2021   Date of Consult: 05/15/2021 Primary Physician: Marguerita Merles, MD Primary Cardiologist: None  Chief Complaint:  Chief Complaint  Patient presents with  . Chest Pain   Reason for Consult:  Chest pain  HPI: 75 y.o. male with known diabetes hypertension hyperlipidemia on appropriate medication management including beta-blocker ACE inhibitor and diabetic medication management although has not used any lipid management in the past.  The patient has had new onset substernal chest discomfort radiating into his left upper shoulder and in his jaws and mouth lasting for quite a long time and feeling weak and fatigued.  Upon arrival to the emergency room the patient had an EKG showing normal sinus rhythm otherwise normal EKG.  His chest pain has been relieved by heparin and nitroglycerin.  Currently he is hemodynamically stable.  Troponin levels have been 532/317 consistent with acute non-ST elevation myocardial infarction  Past Medical History:  Diagnosis Date  . Arthritis   . BPH (benign prostatic hyperplasia)   . Diabetes mellitus, type 2 ([place])   . DVT (deep venous thrombosis) ([place]) 05/12/2017   left upper leg  . Emphysema of lung ([place])    mild - on xray  . Fatty liver   . GERD (gastroesophageal reflux disease)   . Hypertension   . Kidney stone       Surgical History:  Past Surgical History:  Procedure Laterality Date  . CATARACT EXTRACTION W/PHACO Right 09/11/2018   Procedure: CATARACT EXTRACTION PHACO AND INTRAOCULAR LENS PLACEMENT ([place])  RIGHT DIABETIC;  Surgeon: Leandrew Koyanagi, MD;  Location: [place];  Service: Ophthalmology;  Laterality: Right;  Diabetic - oral meds Latex sensitiviy  . CATARACT EXTRACTION W/PHACO Left 10/09/2018   Procedure: CATARACT EXTRACTION PHACO AND INTRAOCULAR LENS PLACEMENT ([place])  LEFT DIABETIC;   Surgeon: Leandrew Koyanagi, MD;  Location: [place];  Service: Ophthalmology;  Laterality: Left;  Diabetic - oral meds Latex sensitivity  . COLONOSCOPY WITH PROPOFOL N/A 07/28/2019   Procedure: COLONOSCOPY WITH PROPOFOL;  Surgeon: Toledo, Benay Pike, MD;  Location: [facility];  Service: Gastroenterology;  Laterality: N/A;  . EXTRACORPOREAL SHOCK WAVE LITHOTRIPSY Left 01/25/2017   Procedure: EXTRACORPOREAL SHOCK WAVE LITHOTRIPSY (ESWL);  Surgeon: Nickie Retort, MD;  Location: [facility];  Service: Urology;  Laterality: Left;  . LASER OF PROSTATE W/ GREEN LIGHT PVP  2008   Dr. Quillian Quince  . LUMBAR LAMINECTOMY    . TONSILLECTOMY AND ADENOIDECTOMY    . URETEROSCOPY WITH HOLMIUM LASER LITHOTRIPSY  2004     Home Meds: Prior to Admission medications   Medication Sig Start Date End Date Taking? Authorizing Provider  allopurinol (ZYLOPRIM) 300 MG tablet Take 1 tablet by mouth daily. 11/20/18  Yes [provider]  aspirin EC 81 MG tablet Take 81 mg by mouth daily.   Yes [provider]  Cholecalciferol 1000 UNITS tablet Take 1,000 Units by mouth 2 (two) times daily.   Yes [provider]  esomeprazole (NEXIUM) 40 MG capsule Take 1 capsule by mouth daily. 10/24/19  Yes [provider]  Fish Oil-Cholecalciferol (FISH OIL + D3 PO) Take 1 capsule by mouth 2 (two) times daily.   Yes [provider]  HAWTHORN EXTRACT PO Take 1 tablet by mouth daily.   Yes [provider]  ketorolac (TORADOL) 10 MG tablet Take 1 tablet (10 mg total) by mouth every 6 (six) hours as  needed for moderate pain or severe pain. 03/14/21  Yes Hinda Kehr, MD  lisinopril (ZESTRIL) 20 MG tablet Take 10 mg by mouth daily. 11/26/20  Yes [provider]  metFORMIN (GLUCOPHAGE-XR) 500 MG 24 hr tablet Take 1,000 mg by mouth 2 (two) times daily. 03/07/21  Yes [provider]  morphine (MS CONTIN) 30 MG 12 hr tablet Take 30 mg by mouth 2 (two) times daily.    Yes [provider]  morphine (MSIR) 15 MG tablet Take 15 mg by mouth 3 (three) times daily as needed.   Yes [provider]  Nutritional Supplements (JUICE PLUS FIBRE) LIQD Take 1 Dose by mouth daily.   Yes [provider]  diclofenac sodium (VOLTAREN) 1 % GEL Apply 2 g topically as needed.    [provider]  Hylan (SYNVISC IX) Inject 6 mLs into the articular space every 6 (six) months. Both knees    [provider]  metFORMIN (GLUCOPHAGE) 500 MG tablet Take 1 tablet (500 mg total) by mouth 2 (two) times daily with a meal. Patient taking differently: Take 500 mg by mouth 2 (two) times daily with a meal. 2 tabs AM, 1 tab PM 11/12/17 11/12/18  Lavonia Drafts, MD  Multiple Vitamin (MULTIVITAMIN) tablet Take 1 tablet by mouth daily. Patient not taking: Reported on 05/14/2021    [provider]  ondansetron (ZOFRAN ODT) 4 MG disintegrating tablet Allow 1-2 tablets to dissolve in your mouth every 8 hours as needed for nausea/vomiting Patient not taking: Reported on 05/14/2021 03/14/21   Hinda Kehr, MD  tamsulosin (FLOMAX) 0.4 MG CAPS capsule Take 0.4 mg by mouth daily. Patient not taking: Reported on 05/14/2021 04/27/21   [provider]    Inpatient Medications:  . allopurinol  300 mg Oral Daily  . aspirin EC  81 mg Oral Daily  . insulin aspart  0-5 Units Subcutaneous QHS  . insulin aspart  0-9 Units Subcutaneous TID WC  . lisinopril  10 mg Oral Daily  . metoprolol tartrate  12.5 mg Oral BID  . pantoprazole  80 mg Oral Q1200   . heparin 1,200 Units/hr (05/15/21 0553)  . nitroGLYCERIN 50 mcg/min (05/15/21 0553)    Allergies:  Allergies  Allergen Reactions  . Augmentin [Amoxicillin-Pot Clavulanate] Anaphylaxis  . Latex Other (See Comments)    Nasal inflammation  . Other Anaphylaxis  . Solifenacin Other (See Comments)  . Sulfa Antibiotics Anaphylaxis  . Rosuvastatin     Other reaction(s): Muscle pain  . Atorvastatin     Other  reaction(s): Muscle Pain, Pain in lower limb  . Omeprazole     Other reaction(s): Heartburn  . Pravastatin     Other reaction(s): Muscle pain  . Simvastatin Other (See Comments)    Other reaction(s): Fatigue, Muscle pain    Social History   Socioeconomic History  . Marital status: Married    Spouse name: Theodoros Stjames  . Number of children: Not on file  . Years of education: Not on file  . Highest education level: Not on file  Occupational History  . Not on file  Tobacco Use  . Smoking status: Former    Packs/day: 1.00    Years: 15.00    Pack years: 15.00    Types: Cigarettes    Quit date: 1995    Years since quitting: 27.5  . Smokeless tobacco: Never  Vaping Use  . Vaping Use: Never used  Substance and Sexual Activity  . Alcohol use: Yes  Alcohol/week: 0.0 standard drinks    Comment: Holidays only  . Drug use: No  . Sexual activity: Not Currently    Birth control/protection: None  Other Topics Concern  . Not on file  Social History Narrative  . Not on file   Social Determinants of Health   Financial Resource Strain: Not on file  Food Insecurity: Not on file  Transportation Needs: Not on file  Physical Activity: Not on file  Stress: Not on file  Social Connections: Not on file  Intimate Partner Violence: Not on file     Family History  Problem Relation Age of Onset  . Kidney disease Neg Hx   . Kidney cancer Neg Hx   . Prostate cancer Neg Hx      Review of Systems Positive for chest pain Negative for: General:  chills, fever, night sweats or weight changes.  Cardiovascular: PND orthopnea syncope dizziness  Dermatological skin lesions rashes Respiratory: Cough congestion Urologic: Frequent urination urination at night and hematuria Abdominal: negative for nausea, vomiting, diarrhea, bright red blood per rectum, melena, or hematemesis Neurologic: negative for visual changes, and/or hearing changes  All other systems reviewed and are otherwise  negative except as noted above.  Labs: No results for input(s): CKTOTAL, CKMB, TROPONINI in the last 72 hours. Lab Results  Component Value Date   WBC 11.4 (H) 05/15/2021   HGB 13.9 05/15/2021   HCT 39.1 05/15/2021   MCV 90.5 05/15/2021   PLT 154 05/15/2021    Recent Labs  Lab 05/15/21 0603  NA 133*  K 3.8  CL 100  CO2 26  BUN 17  CREATININE 0.99  CALCIUM 8.5*  GLUCOSE 273*   Lab Results  Component Value Date   CHOL 141 05/15/2021   HDL 34 (L) 05/15/2021   LDLCALC 68 05/15/2021   TRIG 196 (H) 05/15/2021   No results found for: DDIMER  Radiology/Studies:  DG Chest 2 View  Result Date: 05/14/2021 CLINICAL DATA:  Chest pain EXAM: CHEST - 2 VIEW COMPARISON:  03/14/2021 FINDINGS: Heart and mediastinal contours are within normal limits. No focal opacities or effusions. No acute bony abnormality. IMPRESSION: No active cardiopulmonary disease. Electronically Signed   By: Rolm Baptise M.D.   On: 05/14/2021 14:34   CT Angio Chest/Abd/Pel for Dissection W and/or Wo Contrast  Result Date: 05/14/2021 CLINICAL DATA:  Chest pain radiating into arms and back, shortness of breath, nausea, diaphoresis EXAM: CT ANGIOGRAPHY CHEST, ABDOMEN AND PELVIS TECHNIQUE: Non-contrast CT of the chest was initially obtained. Multidetector CT imaging through the chest, abdomen and pelvis was performed using the standard protocol during bolus administration of intravenous contrast. Multiplanar reconstructed images and MIPs were obtained and reviewed to evaluate the vascular anatomy. CONTRAST:  123mL OMNIPAQUE IOHEXOL 350 MG/ML SOLN COMPARISON:  CT abdomen pelvis, 03/14/2021 FINDINGS: CTA CHEST FINDINGS Cardiovascular: Preferential opacification of the thoracic aorta. Normal contour and caliber of the thoracic aorta. No evidence of aneurysm, dissection, or other acute aortic pathology. Mild mixed calcific atherosclerosis. Normal heart size. Three-vessel coronary artery calcifications. No pericardial effusion.  Mediastinum/Nodes: No enlarged mediastinal, hilar, or axillary lymph nodes. Thyroid gland, trachea, and esophagus demonstrate no significant findings. Lungs/Pleura: Lungs are clear. No pleural effusion or pneumothorax. Musculoskeletal: No chest wall abnormality. No acute or significant osseous findings. Review of the MIP images confirms the above findings. CTA ABDOMEN AND PELVIS FINDINGS VASCULAR Normal contour and caliber of the abdominal aorta. No evidence of aneurysm, dissection, or other acute aortic pathology. Moderate mixed calcific atherosclerosis. Standard  branching pattern of the abdominal aorta with solitary bilateral renal arteries. Review of the MIP images confirms the above findings. NON-VASCULAR Hepatobiliary: No solid liver abnormality is seen. Multiple gallstones in the gallbladder. No gallbladder wall thickening, or biliary dilatation. Pancreas: Unremarkable. No pancreatic ductal dilatation or surrounding inflammatory changes. Spleen: Normal in size without significant abnormality. Adrenals/Urinary Tract: Adrenal glands are unremarkable. Kidneys are normal, without renal calculi, solid lesion, or hydronephrosis. Bladder is unremarkable. Stomach/Bowel: Stomach is within normal limits. Appendix appears normal. No evidence of bowel wall thickening, distention, or inflammatory changes. 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 Lymphatic: No enlarged abdominal or pelvic lymph nodes. Reproductive: No mass or other significant abnormality. Other: No abdominal wall hernia or abnormality. No abdominopelvic ascites. Musculoskeletal: No acute or significant osseous findings. Review of the MIP images confirms the above findings. IMPRESSION: 1. Normal contour and caliber of the thoracic and abdominal aorta. No evidence of aneurysm, dissection, or other acute aortic pathology. Mild to moderate mixed calcific atherosclerosis. 2. Coronary artery disease. 3. Cholelithiasis. 4. Pancolonic diverticulosis,  severe in the sigmoid. No evidence of acute diverticulitis. Electronically Signed   By: Eddie Candle M.D.   On: 05/14/2021 16:55    EKG: Normal sinus rhythm otherwise normal EKG  Weights: Filed Weights   05/14/21 1359  Weight: 93 kg     Physical Exam: Blood pressure 123/73, pulse 77, temperature 98 F (36.7 C), temperature source Oral, resp. rate 17, height 6\' 1"  (1.854 m), weight 93 kg, SpO2 96 %. Body mass index is 27.05 kg/m. General: Well developed, well nourished, in no acute distress. Head eyes ears nose throat: Normocephalic, atraumatic, sclera non-icteric, no xanthomas, nares are without discharge. No apparent thyromegaly and/or mass  Lungs: Normal respiratory effort.  no wheezes, no rales, no rhonchi.  Heart: RRR with normal S1 S2. no murmur gallop, no rub, PMI is normal size and placement, carotid upstroke normal without bruit, jugular venous pressure is normal Abdomen: Soft, non-tender, non-distended with normoactive bowel sounds. No hepatomegaly. No rebound/guarding. No obvious abdominal masses. Abdominal aorta is normal size without bruit Extremities: No edema. no cyanosis, no clubbing, no ulcers  Peripheral : 2+ bilateral upper extremity pulses, 2+ bilateral femoral pulses, 2+ bilateral dorsal pedal pulse Neuro: Alert and oriented. No facial asymmetry. No focal deficit. Moves all extremities spontaneously. Musculoskeletal: Normal muscle tone without kyphosis Psych:  Responds to questions appropriately with a normal affect.    Assessment: 75 year old male with known cardiovascular disease risk factors including hypertension hyperlipidemia and diabetes with chest pain consistent with acute non-ST elevation myocardial infarction without evidence of congestive heart failure   Plan: 1.  Continue heparin for further risk reduction in myocardial infarction and/or unstable angina.  Patient also continue nitroglycerin for chest pain 2.  Continue medication management for risk  factor treatment of coronary artery disease 3.  Proceed to cardiac catheterization to assess coronary anatomy and further treatment thereof is necessary.  Patient understands the risk and benefits of cardiac catheterization.  This includes a possibility of death stroke heart attack infection bleeding or blood clot.  He is at low risk for conscious sedation.  This will occur on Monday morning  Signed, Corey Skains M.D. [facility] 05/15/2021, 11:42 AM

## 2021-05-15 NOTE — Progress Notes (Signed)
PROGRESS NOTE    Ralph Marsh  RDE:[id number] DOB: 1946/09/12 DOA: 05/14/2021 PCP: Marguerita Merles, MD   Chief Complaint  Patient presents with   Chest Pain    Brief Narrative: 75 year old male with a prior DVT, HTN, HLD T2DM, left nephrolithiasis presented with chest pain in the central chest mostly constant, onset since 7/8 afternoon after a meal. Patient was seen in the ED with mild hypertension intermittent tachypnea and hypoxemia requiring 2 L nasal cannula lab with elevated troponin COVID-19 negative CT angio chest abdomen pelvis no evidence of PE, showed cholelithiasis, CAD.  [facility] cardiology was contacted patient was placed on heparin drip for ACS protocol and admitted. Patient had no relief with nitroglycerin but had improvement from morphine.  Subjective: C/o chest pain though th night Mild cough , no shortness of breath Overnight afebrile on 2 L nasal cannula for comfort   Assessment & Plan:  NSTEMI:Trops elevated with 317>532.  LDL stable at 68.  Cardiology has been consulted, continue heparin drip/nitroglycerin, metoprolol, lisinopril and aspirin.  Patient has a statin intolerance.  Await further recommendation from cardiology. Discussed w/ cardio and is aware.  CAD:See # 1 Hypertension on metoprolol and lisinopril T2DM holding home metformin continue sliding scale insulin for now.  Blood sugar is poorly controlled. Recent Labs  Lab 05/14/21 2220 05/15/21 0849  GLUCAP 217* 277*  No results found for: HGBA1C   Chronic gout on allopurinol GERD continue PPI  Hx of Left lower extremity DVT in 2018 Overweight w/ BMI 27  Diet Order             Diet heart healthy/carb modified Room service appropriate? Yes; Fluid consistency: Thin  Diet effective now                   Patient's Body mass index is 27.05 kg/m. DVT prophylaxis: Heparin Code Status:   Code Status: Full Code  Family Communication: plan of care discussed with patient at bedside.  Status is:  Inpatient Remains inpatient appropriate because:IV treatments appropriate due to intensity of illness or inability to take PO and Inpatient level of care appropriate due to severity of illness Dispo: The patient is from: Home              Anticipated d/c is to: Home              Patient currently is not medically stable to d/c.   Difficult to place patient No Unresulted Labs (From admission, onward)     Start     Ordered   05/16/21 6314  Basic metabolic panel  Daily,   R     Question:  Specimen collection method  Answer:  Lab=Lab collect   05/15/21 0826   05/16/21 0500  CBC  Daily,   R     Question:  Specimen collection method  Answer:  Lab=Lab collect   05/15/21 0826   05/15/21 2100  Heparin level (unfractionated)  Once-Timed,   STAT        05/15/21 0139   05/15/21 0500  Hemoglobin A1c  Tomorrow morning,   STAT       Comments: To assess prior glycemic control    05/14/21 1959           Medications reviewed:  Scheduled Meds:  allopurinol  300 mg Oral Daily   aspirin EC  81 mg Oral Daily   insulin aspart  0-5 Units Subcutaneous QHS   insulin aspart  0-9 Units Subcutaneous TID WC  lisinopril  10 mg Oral Daily   metoprolol tartrate  12.5 mg Oral BID   pantoprazole  80 mg Oral Q1200   Continuous Infusions:  heparin 1,200 Units/hr (05/15/21 0553)   nitroGLYCERIN 50 mcg/min (05/15/21 0553)    Consultants:see note  Procedures:see note  Antimicrobials: Anti-infectives (From admission, onward)    None      Culture/Microbiology    Component Value Date/Time   SDES BLOOD RIGHT ASSIST CONTROL 01/20/2017 0602   SPECREQUEST BOTTLES DRAWN AEROBIC AND ANAEROBIC BCHV 01/20/2017 0602   CULT NO GROWTH 5 DAYS 01/20/2017 0602   REPTSTATUS 01/25/2017 FINAL 01/20/2017 0602    Other culture-see note  Objective: Vitals: Today's Vitals   05/15/21 0750 05/15/21 0854 05/15/21 0856 05/15/21 0935  BP: 117/85  123/73   Pulse: 77     Resp: 17     Temp: 98 F (36.7 C)      TempSrc: Oral     SpO2: 96%     Weight:      Height:      PainSc:  3   2     Intake/Output Summary (Last 24 hours) at 05/15/2021 1109 Last data filed at 05/15/2021 0553 Gross per 24 hour  Intake 296.08 ml  Output 300 ml  Net -3.92 ml   Filed Weights   05/14/21 1359  Weight: 93 kg   Weight change:   Intake/Output from previous day: 07/09 0701 - 07/10 0700 In: 296.1 [I.V.:296.1] Out: 300 [Urine:300] Intake/Output this shift: No intake/output data recorded. Filed Weights   05/14/21 1359  Weight: 93 kg    Examination: General exam: AAO x3 , older than stated age, weak appearing. HEENT:Oral mucosa moist, Ear/Nose WNL grossly,dentition normal. Respiratory system: bilaterally diminished, no use of accessory muscle, non tender. Cardiovascular system: S1 & S2 +,no murmur. No JVD. Gastrointestinal system: Abdomen soft, NT,ND, BS+. Nervous System:Alert, awake, moving extremities Extremities: no edema, distal peripheral pulses palpable.  Skin: No rashes,no icterus. MSK: Normal muscle bulk,tone, power  Data Reviewed: I have personally reviewed following labs and imaging studies CBC: Recent Labs  Lab 05/14/21 1403 05/15/21 0603  WBC 7.7 11.4*  HGB 15.7 13.9  HCT 45.0 39.1  MCV 91.1 90.5  PLT 171 097   Basic Metabolic Panel: Recent Labs  Lab 05/14/21 1403 05/15/21 0603  NA 131* 133*  K 4.0 3.8  CL 99 100  CO2 19* 26  GLUCOSE 282* 273*  BUN 19 17  CREATININE 1.11 0.99  CALCIUM 8.9 8.5*   GFR: Estimated Creatinine Clearance: 70.6 mL/min (by C-G formula based on SCr of 0.99 mg/dL). Liver Function Tests: No results for input(s): AST, ALT, ALKPHOS, BILITOT, PROT, ALBUMIN in the last 168 hours. No results for input(s): LIPASE, AMYLASE in the last 168 hours. No results for input(s): AMMONIA in the last 168 hours. Coagulation Profile: Recent Labs  Lab 05/14/21 1701  INR 1.0   Cardiac Enzymes: No results for input(s): CKTOTAL, CKMB, CKMBINDEX, TROPONINI in the  last 168 hours. BNP (last 3 results) No results for input(s): PROBNP in the last 8760 hours. HbA1C: No results for input(s): HGBA1C in the last 72 hours. CBG: Recent Labs  Lab 05/14/21 2220 05/15/21 0849  GLUCAP 217* 277*   Lipid Profile: Recent Labs    05/15/21 0603  CHOL 141  HDL 34*  LDLCALC 68  TRIG 196*  CHOLHDL 4.1   Thyroid Function Tests: No results for input(s): TSH, T4TOTAL, FREET4, T3FREE, THYROIDAB in the last 72 hours. Anemia Panel: No results for input(s):  VITAMINB12, FOLATE, FERRITIN, TIBC, IRON, RETICCTPCT in the last 72 hours. Sepsis Labs: No results for input(s): PROCALCITON, LATICACIDVEN in the last 168 hours.  Recent Results (from the past 240 hour(s))  Resp Panel by RT-PCR (Flu A&B, Covid) Nasopharyngeal Swab     Status: None   Collection Time: 05/14/21  3:45 PM   Specimen: Nasopharyngeal Swab; Nasopharyngeal(NP) swabs in vial transport medium  Result Value Ref Range Status   SARS Coronavirus 2 by RT PCR NEGATIVE NEGATIVE Final    Comment: (NOTE) SARS-CoV-2 target nucleic acids are NOT DETECTED.  The SARS-CoV-2 RNA is generally detectable in upper respiratory specimens during the acute phase of infection. The lowest concentration of SARS-CoV-2 viral copies this assay can detect is 138 copies/mL. A negative result does not preclude SARS-Cov-2 infection and should not be used as the sole basis for treatment or other patient management decisions. A negative result may occur with  improper specimen collection/handling, submission of specimen other than nasopharyngeal swab, presence of viral mutation(s) within the areas targeted by this assay, and inadequate number of viral copies(<138 copies/mL). A negative result must be combined with clinical observations, patient history, and epidemiological information. The expected result is Negative.  Fact Sheet for Patients:  EntrepreneurPulse.com.au  Fact Sheet for Healthcare Providers:   IncredibleEmployment.be  This test is no t yet approved or cleared by the [place] FDA and  has been authorized for detection and/or diagnosis of SARS-CoV-2 by FDA under an Emergency Use Authorization (EUA). This EUA will remain  in effect (meaning this test can be used) for the duration of the COVID-19 declaration under Section 564(b)(1) of the Act, 21 U.S.C.section 360bbb-3(b)(1), unless the authorization is terminated  or revoked sooner.       Influenza A by PCR NEGATIVE NEGATIVE Final   Influenza B by PCR NEGATIVE NEGATIVE Final    Comment: (NOTE) The Xpert Xpress SARS-CoV-2/FLU/RSV plus assay is intended as an aid in the diagnosis of influenza from Nasopharyngeal swab specimens and should not be used as a sole basis for treatment. Nasal washings and aspirates are unacceptable for Xpert Xpress SARS-CoV-2/FLU/RSV testing.  Fact Sheet for Patients: EntrepreneurPulse.com.au  Fact Sheet for Healthcare Providers: IncredibleEmployment.be  This test is not yet approved or cleared by the [place] FDA and has been authorized for detection and/or diagnosis of SARS-CoV-2 by FDA under an Emergency Use Authorization (EUA). This EUA will remain in effect (meaning this test can be used) for the duration of the COVID-19 declaration under Section 564(b)(1) of the Act, 21 U.S.C. section 360bbb-3(b)(1), unless the authorization is terminated or revoked.  Performed at [facility], [address], [place], [place] [address]      Radiology Studies: DG Chest 2 View  Result Date: 05/14/2021 CLINICAL DATA:  Chest pain EXAM: CHEST - 2 VIEW COMPARISON:  03/14/2021 FINDINGS: Heart and mediastinal contours are within normal limits. No focal opacities or effusions. No acute bony abnormality. IMPRESSION: No active cardiopulmonary disease. Electronically Signed   By: Rolm Baptise M.D.   On: 05/14/2021 14:34   CT Angio  Chest/Abd/Pel for Dissection W and/or Wo Contrast  Result Date: 05/14/2021 CLINICAL DATA:  Chest pain radiating into arms and back, shortness of breath, nausea, diaphoresis EXAM: CT ANGIOGRAPHY CHEST, ABDOMEN AND PELVIS TECHNIQUE: Non-contrast CT of the chest was initially obtained. Multidetector CT imaging through the chest, abdomen and pelvis was performed using the standard protocol during bolus administration of intravenous contrast. Multiplanar reconstructed images and MIPs were obtained and reviewed to evaluate  the vascular anatomy. CONTRAST:  163mL OMNIPAQUE IOHEXOL 350 MG/ML SOLN COMPARISON:  CT abdomen pelvis, 03/14/2021 FINDINGS: CTA CHEST FINDINGS Cardiovascular: Preferential opacification of the thoracic aorta. Normal contour and caliber of the thoracic aorta. No evidence of aneurysm, dissection, or other acute aortic pathology. Mild mixed calcific atherosclerosis. Normal heart size. Three-vessel coronary artery calcifications. No pericardial effusion. Mediastinum/Nodes: No enlarged mediastinal, hilar, or axillary lymph nodes. Thyroid gland, trachea, and esophagus demonstrate no significant findings. Lungs/Pleura: Lungs are clear. No pleural effusion or pneumothorax. Musculoskeletal: No chest wall abnormality. No acute or significant osseous findings. Review of the MIP images confirms the above findings. CTA ABDOMEN AND PELVIS FINDINGS VASCULAR Normal contour and caliber of the abdominal aorta. No evidence of aneurysm, dissection, or other acute aortic pathology. Moderate mixed calcific atherosclerosis. Standard branching pattern of the abdominal aorta with solitary bilateral renal arteries. Review of the MIP images confirms the above findings. NON-VASCULAR Hepatobiliary: No solid liver abnormality is seen. Multiple gallstones in the gallbladder. No gallbladder wall thickening, or biliary dilatation. Pancreas: Unremarkable. No pancreatic ductal dilatation or surrounding inflammatory changes. Spleen:  Normal in size without significant abnormality. Adrenals/Urinary Tract: Adrenal glands are unremarkable. Kidneys are normal, without renal calculi, solid lesion, or hydronephrosis. Bladder is unremarkable. Stomach/Bowel: Stomach is within normal limits. Appendix appears normal. No evidence of bowel wall thickening, distention, or inflammatory changes. 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 Lymphatic: No enlarged abdominal or pelvic lymph nodes. Reproductive: No mass or other significant abnormality. Other: No abdominal wall hernia or abnormality. No abdominopelvic ascites. Musculoskeletal: No acute or significant osseous findings. Review of the MIP images confirms the above findings. IMPRESSION: 1. Normal contour and caliber of the thoracic and abdominal aorta. No evidence of aneurysm, dissection, or other acute aortic pathology. Mild to moderate mixed calcific atherosclerosis. 2. Coronary artery disease. 3. Cholelithiasis. 4. 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 No evidence of acute diverticulitis. Electronically Signed   By: Eddie Candle M.D.   On: 05/14/2021 16:55     LOS: 1 day   Antonieta Pert, MD [facility]  05/15/2021, 11:09 AM

## 2021-05-15 NOTE — Progress Notes (Signed)
*  PRELIMINARY RESULTS* Echocardiogram 2D Echocardiogram has been performed. Definity IV Contrast was used on this study.  Ralph Marsh 05/15/2021, 1:00 PM

## 2021-05-16 ENCOUNTER — Encounter
Admission: EM | Disposition: A | Discharge status: Other Acute Inpatient Hospital | Payer: Self-pay | Source: Home / Self Care | Attending: Internal Medicine

## 2021-05-16 ENCOUNTER — Encounter: Payer: Self-pay | Admitting: Internal Medicine

## 2021-05-16 HISTORY — PX: LEFT HEART CATH AND CORONARY ANGIOGRAPHY: CATH118249

## 2021-05-16 LAB — CBC
HCT: 39.7 % (ref 39.0–52.0)
Hemoglobin: 13.8 g/dL (ref 13.0–17.0)
MCH: 32.4 pg (ref 26.0–34.0)
MCHC: 34.8 g/dL (ref 30.0–36.0)
MCV: 93.2 fL (ref 80.0–100.0)
Platelets: 144 10*3/uL — ABNORMAL LOW (ref 150–400)
RBC: 4.26 MIL/uL (ref 4.22–5.81)
RDW: 12.9 % (ref 11.5–15.5)
WBC: 11.3 10*3/uL — ABNORMAL HIGH (ref 4.0–10.5)
nRBC: 0 % (ref 0.0–0.2)

## 2021-05-16 LAB — BASIC METABOLIC PANEL
Anion gap: 8 (ref 5–15)
BUN: 13 mg/dL (ref 8–23)
CO2: 26 mmol/L (ref 22–32)
Calcium: 8.6 mg/dL — ABNORMAL LOW (ref 8.9–10.3)
Chloride: 102 mmol/L (ref 98–111)
Creatinine, Ser: 0.92 mg/dL (ref 0.61–1.24)
GFR, Estimated: >60 mL/min (ref >60)
Glucose, Bld: 207 mg/dL — ABNORMAL HIGH (ref 70–99)
Potassium: 3.5 mmol/L (ref 3.5–5.1)
Sodium: 136 mmol/L (ref 135–145)

## 2021-05-16 LAB — HEPARIN LEVEL (UNFRACTIONATED)
Heparin Unfractionated: 0.54 IU/mL (ref 0.30–0.70)
Heparin Unfractionated: 0.28 IU/mL — ABNORMAL LOW (ref 0.30–0.70)

## 2021-05-16 LAB — GLUCOSE, CAPILLARY
Glucose-Capillary: 310 mg/dL — ABNORMAL HIGH (ref 70–99)
Glucose-Capillary: 222 mg/dL — ABNORMAL HIGH (ref 70–99)
Glucose-Capillary: 218 mg/dL — ABNORMAL HIGH (ref 70–99)

## 2021-05-16 SURGERY — LEFT HEART CATH AND CORONARY ANGIOGRAPHY
Anesthesia: Moderate Sedation

## 2021-05-16 MED ORDER — SODIUM CHLORIDE 0.9 % IV SOLN: 250.0000 mL | INTRAVENOUS | Status: DC | PRN

## 2021-05-16 MED ORDER — SODIUM CHLORIDE 0.9 % WEIGHT BASED INFUSION
3.0000 mL/kg/h | INTRAVENOUS | Status: DC
  Administered 2021-05-16: 3 mL/kg/h via INTRAVENOUS

## 2021-05-16 MED ORDER — ACETAMINOPHEN 325 MG PO TABS: 650.0000 mg | ORAL_TABLET | ORAL | Status: DC | PRN

## 2021-05-16 MED ORDER — IOHEXOL 300 MG/ML  SOLN
INTRAMUSCULAR | Status: DC | PRN
  Administered 2021-05-16: 79 mL

## 2021-05-16 MED ORDER — HYDRALAZINE HCL 20 MG/ML IJ SOLN: 10.0000 mg | INTRAMUSCULAR | Status: AC | PRN

## 2021-05-16 MED ORDER — ASPIRIN 81 MG PO CHEW
CHEWABLE_TABLET | ORAL | Status: AC
  Filled 2021-05-16: qty 1

## 2021-05-16 MED ORDER — FENTANYL CITRATE (PF) 100 MCG/2ML IJ SOLN
INTRAMUSCULAR | Status: AC
  Filled 2021-05-16: qty 2

## 2021-05-16 MED ORDER — HEPARIN (PORCINE) IN NACL 1000-0.9 UT/500ML-% IV SOLN
INTRAVENOUS | Status: DC | PRN
  Administered 2021-05-16 (×2): 500 mL

## 2021-05-16 MED ORDER — ASPIRIN 81 MG PO CHEW
81.0000 mg | CHEWABLE_TABLET | ORAL | Status: AC
  Administered 2021-05-16: 81 mg via ORAL

## 2021-05-16 MED ORDER — HEPARIN BOLUS VIA INFUSION
1400.0000 [IU] | Freq: Once | INTRAVENOUS | Status: AC
  Administered 2021-05-16: 1400 [IU] via INTRAVENOUS
  Filled 2021-05-16: qty 1400

## 2021-05-16 MED ORDER — HEPARIN SODIUM (PORCINE) 1000 UNIT/ML IJ SOLN
INTRAMUSCULAR | Status: DC | PRN
  Administered 2021-05-16: 4500 [IU] via INTRAVENOUS

## 2021-05-16 MED ORDER — HEPARIN (PORCINE) IN NACL 1000-0.9 UT/500ML-% IV SOLN
INTRAVENOUS | Status: AC
  Filled 2021-05-16: qty 1000

## 2021-05-16 MED ORDER — INSULIN DETEMIR 100 UNIT/ML ~~LOC~~ SOLN
10.0000 [IU] | Freq: Every day | SUBCUTANEOUS | Status: DC
  Administered 2021-05-16 – 2021-05-17 (×2): 10 [IU] via SUBCUTANEOUS
  Filled 2021-05-16 (×2): qty 0.1

## 2021-05-16 MED ORDER — VERAPAMIL HCL 2.5 MG/ML IV SOLN
INTRAVENOUS | Status: DC | PRN
  Administered 2021-05-16: 2.5 mg via INTRA_ARTERIAL

## 2021-05-16 MED ORDER — ONDANSETRON HCL 4 MG/2ML IJ SOLN: 4.0000 mg | Freq: Four times a day (QID) | INTRAMUSCULAR | Status: DC | PRN

## 2021-05-16 MED ORDER — MIDAZOLAM HCL 2 MG/2ML IJ SOLN
INTRAMUSCULAR | Status: DC | PRN
  Administered 2021-05-16: 1 mg via INTRAVENOUS

## 2021-05-16 MED ORDER — MIDAZOLAM HCL 2 MG/2ML IJ SOLN
INTRAMUSCULAR | Status: AC
  Filled 2021-05-16: qty 2

## 2021-05-16 MED ORDER — FENTANYL CITRATE (PF) 100 MCG/2ML IJ SOLN
INTRAMUSCULAR | Status: DC | PRN
  Administered 2021-05-16: 25 ug via INTRAVENOUS

## 2021-05-16 MED ORDER — SODIUM CHLORIDE 0.9% FLUSH: 3.0000 mL | INTRAVENOUS | Status: DC | PRN

## 2021-05-16 MED ORDER — HEPARIN SODIUM (PORCINE) 1000 UNIT/ML IJ SOLN
INTRAMUSCULAR | Status: AC
  Filled 2021-05-16: qty 1

## 2021-05-16 MED ORDER — SODIUM CHLORIDE 0.9 % WEIGHT BASED INFUSION: 1.0000 mL/kg/h | INTRAVENOUS | Status: AC

## 2021-05-16 MED ORDER — LABETALOL HCL 5 MG/ML IV SOLN: 10.0000 mg | INTRAVENOUS | Status: AC | PRN

## 2021-05-16 MED ORDER — SODIUM CHLORIDE 0.9 % WEIGHT BASED INFUSION: 1.0000 mL/kg/h | INTRAVENOUS | Status: DC

## 2021-05-16 MED ORDER — LIDOCAINE HCL (PF) 1 % IJ SOLN
INTRAMUSCULAR | Status: DC | PRN
  Administered 2021-05-16: 2 mL

## 2021-05-16 MED ORDER — LIDOCAINE HCL (PF) 1 % IJ SOLN
INTRAMUSCULAR | Status: AC
  Filled 2021-05-16: qty 30

## 2021-05-16 MED ORDER — VERAPAMIL HCL 2.5 MG/ML IV SOLN
INTRAVENOUS | Status: AC
  Filled 2021-05-16: qty 2

## 2021-05-16 SURGICAL SUPPLY — 11 items
CATH INFINITI 5 FR JL3.5 (CATHETERS) ×2 IMPLANT
CATH INFINITI JR4 5F (CATHETERS) ×2 IMPLANT
DEVICE RAD TR BAND REGULAR (VASCULAR PRODUCTS) ×2 IMPLANT
DRAPE BRACHIAL (DRAPES) ×2 IMPLANT
GLIDESHEATH SLEND SS 6F .021 (SHEATH) ×2 IMPLANT
KIT SYRINGE INJ CVI SPIKEX1 (MISCELLANEOUS) ×2 IMPLANT
PACK CARDIAC CATH (CUSTOM PROCEDURE TRAY) ×2 IMPLANT
PROTECTION STATION PRESSURIZED (MISCELLANEOUS) ×2
SET ATX SIMPLICITY (MISCELLANEOUS) ×2 IMPLANT
STATION PROTECTION PRESSURIZED (MISCELLANEOUS) ×1 IMPLANT
WIRE HI TORQ VERSACORE J 260CM (WIRE) ×2 IMPLANT

## 2021-05-16 NOTE — Consult Note (Signed)
ANTICOAGULATION CONSULT NOTE   Pharmacy Consult for Heparin Indication: chest pain/ACS  Patient Measurements: Height: 6\' 1"  (185.4 cm) Weight: 93 kg (205 lb) IBW/kg (Calculated) : 79.9 Heparin Dosing Weight: 93 kg   Vital Signs: Temp: 98.2 F (36.8 C) (07/10 1946) Temp Source: Oral (07/10 1619) BP: 108/67 (07/10 1946) Pulse Rate: 91 (07/10 1946)  Labs: Recent Labs    05/14/21 1403 05/14/21 1545 05/14/21 1701 05/15/21 0049 05/15/21 0603 05/15/21 2043  HGB 15.7  --   --   --  13.9  --   HCT 45.0  --   --   --  39.1  --   PLT 171  --   --   --  154  --   APTT  --   --  29  --   --   --   LABPROT  --   --  13.6  --   --   --   INR  --   --  1.0  --   --   --   HEPARINUNFRC  --   --   --  0.59  --  0.71*  CREATININE 1.11  --   --   --  0.99  --   TROPONINIHS 317* 532*  --   --   --   --      Estimated Creatinine Clearance: 70.6 mL/min (by C-G formula based on SCr of 0.99 mg/dL).   Medical History: Past Medical History:  Diagnosis Date   Arthritis    BPH (benign prostatic hyperplasia)    Diabetes mellitus, type 2 (HCC)    DVT (deep venous thrombosis) ([place]) 05/12/2017   left upper leg   Emphysema of lung (HCC)    mild - on xray   Fatty liver    GERD (gastroesophageal reflux disease)    Hypertension    Kidney stone     Medications:  No PTA anticoagulation  Assessment: 75 y.o. male with a past history of DVT, HTN, HLD, and T2DM who comes ED complaining of chest pain radiating to both arms and the upper back. Troponin trending up from 317 to 532. Pharmacy has been consulted for heparin dosing for ACS.  H/H and plts WNL  Goal of Therapy:  anti-Xa level 0.3-0.7 units/ml Monitor platelets by anticoagulation protocol: Yes   Plan:  anti-Xa level is therapeutic following previous rate adjustment: continue heparin infusion at 1100 units/hr Re-check anti-Xa level in 8 hours to confirm Continue to monitor H&H and platelets  Dallie Piles,  PharmD 05/16/2021,12:03 AM

## 2021-05-16 NOTE — Progress Notes (Signed)
Inpatient Diabetes Program Recommendations  AACE/ADA: New Consensus Statement on Inpatient Glycemic Control (2015)  Target Ranges:  Prepandial:   less than 140 mg/dL      Peak postprandial:   less than 180 mg/dL (1-2 hours)      Critically ill patients:  140 - 180 mg/dL   Lab Results  Component Value Date   GLUCAP 310 (H) 05/16/2021   HGBA1C 8.2 (H) 05/15/2021    Review of Glycemic Control Results for Ralph Marsh, Ralph Marsh (MRN [id number]) as of 05/16/2021 12:45  Ref. Range 05/15/2021 22:31 05/16/2021 11:47  Glucose-Capillary Latest Ref Range: 70 - 99 mg/dL 216 (H) 310 (H)   Diabetes history: Type 2 DM Outpatient Diabetes medications: Metformin 1000 mg QA/ 500 mg QPM Current orders for Inpatient glycemic control: Novolog 0-9 units TID & HS  Inpatient Diabetes Program Recommendations:    Consider adding Levemir 12 units QD.   Thanks, Bronson Curb, MSN, RNC-OB Diabetes Coordinator [phone] (8a-5p)

## 2021-05-16 NOTE — Progress Notes (Signed)
[facility] Cardiology [facility] Encounter Note  Patient: Ralph Marsh / Admit Date: 05/14/2021 / Date of Encounter: 05/16/2021, 8:44 AM   Subjective: Overall patient doing well today with no further evidence of chest discomfort.  Echocardiogram showing normal LV systolic function although cardiac catheterization imaging possibly with hypokinesis of anterior apical wall.  Cardiac catheterization showing occluded proximal left circumflex with right to left collaterals to small obtuse marginal moderate atherosclerosis of right coronary artery and severe ulcerated ostial left anterior descending artery disease unlikely amenable to PCI and stent placement needing further intervention  Review of Systems: Positive for: None Negative for: Vision change, hearing change, syncope, dizziness, nausea, vomiting,diarrhea, bloody stool, stomach pain, cough, congestion, diaphoresis, urinary frequency, urinary pain,skin lesions, skin rashes Others previously listed  Objective: Telemetry: Normal sinus rhythm Physical Exam: Blood pressure 129/86, pulse 94, temperature 98.2 F (36.8 C), temperature source Oral, resp. rate 15, height 6\' 1"  (1.854 m), weight 93.9 kg, SpO2 93 %. Body mass index is 27.31 kg/m. General: Well developed, well nourished, in no acute distress. Head: Normocephalic, atraumatic, sclera non-icteric, no xanthomas, nares are without discharge. Neck: No apparent masses Lungs: Normal respirations with no wheezes, no rhonchi, no rales , no crackles   Heart: Regular rate and rhythm, normal S1 S2, no murmur, no rub, no gallop, PMI is normal size and placement, carotid upstroke normal without bruit, jugular venous pressure normal Abdomen: Soft, non-tender, non-distended with normoactive bowel sounds. No hepatosplenomegaly. Abdominal aorta is normal size without bruit Extremities: No edema, no clubbing, no cyanosis, no ulcers,  Peripheral: 2+ radial, 2+ femoral, 2+ dorsal pedal pulses Neuro:  Alert and oriented. Moves all extremities spontaneously. Psych:  Responds to questions appropriately with a normal affect.   Intake/Output Summary (Last 24 hours) at 05/16/2021 0844 Last data filed at 05/15/2021 2232 Gross per 24 hour  Intake 240 ml  Output 200 ml  Net 40 ml    Inpatient Medications:  . [MAR Hold] allopurinol  300 mg Oral Daily  . aspirin      . [MAR Hold] aspirin EC  81 mg Oral Daily  . [MAR Hold] insulin aspart  0-5 Units Subcutaneous QHS  . [MAR Hold] insulin aspart  0-9 Units Subcutaneous TID WC  . [MAR Hold] lisinopril  10 mg Oral Daily  . [MAR Hold] metoprolol tartrate  12.5 mg Oral BID  . [MAR Hold] pantoprazole  80 mg Oral Q1200  . [MAR Hold] sodium chloride flush  3 mL Intravenous Q12H   Infusions:  . sodium chloride    . [START ON 05/17/2021] sodium chloride 3 mL/kg/hr (05/16/21 0730)   Followed by  . [START ON 05/17/2021] sodium chloride    . heparin 1,100 Units/hr (05/15/21 2232)  . [MAR Hold] nitroGLYCERIN 30 mcg/min (05/15/21 2241)    Labs: Recent Labs    05/15/21 0603 05/16/21 0452  NA 133* 136  K 3.8 3.5  CL 100 102  CO2 26 26  GLUCOSE 273* 207*  BUN 17 13  CREATININE 0.99 0.92  CALCIUM 8.5* 8.6*   No results for input(s): AST, ALT, ALKPHOS, BILITOT, PROT, ALBUMIN in the last 72 hours. Recent Labs    05/15/21 0603 05/16/21 0452  WBC 11.4* 11.3*  HGB 13.9 13.8  HCT 39.1 39.7  MCV 90.5 93.2  PLT 154 144*   No results for input(s): CKTOTAL, CKMB, TROPONINI in the last 72 hours. Invalid input(s): POCBNP Recent Labs    05/15/21 0603  HGBA1C 8.2*     Weights: [employer]  05/14/21 1359 05/16/21 0725  Weight: 93 kg 93.9 kg     Radiology/Studies:  DG Chest 2 View  Result Date: 05/14/2021 CLINICAL DATA:  Chest pain EXAM: CHEST - 2 VIEW COMPARISON:  03/14/2021 FINDINGS: Heart and mediastinal contours are within normal limits. No focal opacities or effusions. No acute bony abnormality. IMPRESSION: No active cardiopulmonary  disease. Electronically Signed   By: Rolm Baptise M.D.   On: 05/14/2021 14:34   CARDIAC CATHETERIZATION  Result Date: 05/16/2021  Ost LAD to Prox LAD lesion is 75% stenosed.  Prox LAD to Mid LAD lesion is 85% stenosed with 80% stenosed side branch in 1st Sept.  Mid LAD-1 lesion is 60% stenosed.  Mid LAD-2 lesion is 40% stenosed.  Prox RCA lesion is 60% stenosed.  Mid RCA lesion is 40% stenosed.  Dist RCA lesion is 35% stenosed.  Prox Cx lesion is 100% stenosed.  Dist Cx lesion is 100% stenosed.  75-year-old male with hypertension hyperlipidemia and diabetes having acute non-ST elevation myocardial infarction with elevated troponin of 532 and substernal chest discomfort Left ventricle with hypokinesis of anteroapical wall and ejection fraction of 45% Diffuse moderate atherosclerosis of right coronary artery Occluded proximal circumflex artery with minimal obtuse marginal collateralization from right Significantly complicated ulcerated plaque of ostial LAD to proximal LAD with some mid LAD stenoses likely culprit Plan High intensity cholesterol therapy Discussion with cardiac team and cardiovascular surgical team for further treatment options including coronary artery bypass surgery Hypertension control and treatment with beta-blocker and ACE inhibitor for cardiovascular risk reduction after non-ST elevation myocardial infarction Cardiac rehabilitation   ECHOCARDIOGRAM COMPLETE  Result Date: 05/15/2021    ECHOCARDIOGRAM REPORT   Patient Name:   Ralph Marsh Date of Exam: 05/15/2021 Medical Rec #:  [id number]       Height:       73.0 in Accession #:    [id number]      Weight:       205.0 lb Date of Birth:  [birth year]        BSA:          2.174 m Patient Age:    75 years        BP:           130/55 mmHg Patient Gender: M               HR:           88 bpm. Exam Location:  [facility] Procedure: 2D Echo Indications:   nstemi  History:       Patient has no prior history of Echocardiogram examinations.                 Angina.  Sonographer:   Carl Best RN Referring      [phone] MATTHEW M ECKSTAT Phys: IMPRESSIONS  1. Left ventricular ejection fraction, by estimation, is 65 to 70%. The left ventricle has normal function. The left ventricle has no regional wall motion abnormalities. Left ventricular diastolic parameters were normal.  2. Right ventricular systolic function is normal. The right ventricular size is normal.  3. The mitral valve is normal in structure. Trivial mitral valve regurgitation.  4. The aortic valve is normal in structure. Aortic valve regurgitation is trivial. FINDINGS  Left Ventricle: Left ventricular ejection fraction, by estimation, is 65 to 70%. The left ventricle has normal function. The left ventricle has no regional wall motion abnormalities. The left ventricular internal cavity size was normal in size. There is  no left  ventricular hypertrophy. Left ventricular diastolic parameters were normal. Right Ventricle: The right ventricular size is normal. No increase in right ventricular wall thickness. Right ventricular systolic function is normal. Left Atrium: Left atrial size was normal in size. Right Atrium: Right atrial size was normal in size. Pericardium: There is no evidence of pericardial effusion. Mitral Valve: The mitral valve is normal in structure. Trivial mitral valve regurgitation. Tricuspid Valve: The tricuspid valve is normal in structure. Tricuspid valve regurgitation is trivial. Aortic Valve: The aortic valve is normal in structure. Aortic valve regurgitation is trivial. Aortic valve peak gradient measures 3.7 mmHg. Pulmonic Valve: The pulmonic valve was normal in structure. Pulmonic valve regurgitation is not visualized. Aorta: The aortic root and ascending aorta are structurally normal, with no evidence of dilitation. IAS/Shunts: No atrial level shunt detected by color flow Doppler.  LEFT VENTRICLE PLAX 2D LVIDd:         3.87 cm  Diastology LVIDs:         2.75 cm  LV e' medial:     5.44 cm/s LV PW:         0.98 cm  LV E/e' medial:  10.0 LV IVS:        0.96 cm  LV e' lateral:   6.74 cm/s LVOT diam:     2.20 cm  LV E/e' lateral: 8.1 LV SV:         50 LV SV Index:   23 LVOT Area:     3.80 cm  AORTIC VALVE AV Area (Vmax): 2.80 cm AV Vmax:        96.40 cm/s AV Peak Grad:   3.7 mmHg LVOT Vmax:      71.10 cm/s LVOT Vmean:     47.100 cm/s LVOT VTI:       0.132 m  AORTA Ao Root diam: 3.40 cm MITRAL VALVE MV Area (PHT): 4.96 cm    SHUNTS MV Decel Time: 153 msec    Systemic VTI:  0.13 m MV E velocity: 54.40 cm/s  Systemic Diam: 2.20 cm MV A velocity: 89.10 cm/s MV E/A ratio:  0.61 Serafina Royals MD Electronically signed by Serafina Royals MD Signature Date/Time: 05/15/2021/12:38:57 PM    Final    CT Angio Chest/Abd/Pel for Dissection W and/or Wo Contrast  Result Date: 05/14/2021 CLINICAL DATA:  Chest pain radiating into arms and back, shortness of breath, nausea, diaphoresis EXAM: CT ANGIOGRAPHY CHEST, ABDOMEN AND PELVIS TECHNIQUE: Non-contrast CT of the chest was initially obtained. Multidetector CT imaging through the chest, abdomen and pelvis was performed using the standard protocol during bolus administration of intravenous contrast. Multiplanar reconstructed images and MIPs were obtained and reviewed to evaluate the vascular anatomy. CONTRAST:  18mL OMNIPAQUE IOHEXOL 350 MG/ML SOLN COMPARISON:  CT abdomen pelvis, 03/14/2021 FINDINGS: CTA CHEST FINDINGS Cardiovascular: Preferential opacification of the thoracic aorta. Normal contour and caliber of the thoracic aorta. No evidence of aneurysm, dissection, or other acute aortic pathology. Mild mixed calcific atherosclerosis. Normal heart size. Three-vessel coronary artery calcifications. No pericardial effusion. Mediastinum/Nodes: No enlarged mediastinal, hilar, or axillary lymph nodes. Thyroid gland, trachea, and esophagus demonstrate no significant findings. Lungs/Pleura: Lungs are clear. No pleural effusion or pneumothorax. Musculoskeletal: No  chest wall abnormality. No acute or significant osseous findings. Review of the MIP images confirms the above findings. CTA ABDOMEN AND PELVIS FINDINGS VASCULAR Normal contour and caliber of the abdominal aorta. No evidence of aneurysm, dissection, or other acute aortic pathology. Moderate mixed calcific atherosclerosis. Standard branching pattern of the abdominal aorta  with solitary bilateral renal arteries. Review of the MIP images confirms the above findings. NON-VASCULAR Hepatobiliary: No solid liver abnormality is seen. Multiple gallstones in the gallbladder. No gallbladder wall thickening, or biliary dilatation. Pancreas: Unremarkable. No pancreatic ductal dilatation or surrounding inflammatory changes. Spleen: Normal in size without significant abnormality. Adrenals/Urinary Tract: Adrenal glands are unremarkable. Kidneys are normal, without renal calculi, solid lesion, or hydronephrosis. Bladder is unremarkable. Stomach/Bowel: Stomach is within normal limits. Appendix appears normal. No evidence of bowel wall thickening, distention, or inflammatory changes. 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 Lymphatic: No enlarged abdominal or pelvic lymph nodes. Reproductive: No mass or other significant abnormality. Other: No abdominal wall hernia or abnormality. No abdominopelvic ascites. Musculoskeletal: No acute or significant osseous findings. Review of the MIP images confirms the above findings. IMPRESSION: 1. Normal contour and caliber of the thoracic and abdominal aorta. No evidence of aneurysm, dissection, or other acute aortic pathology. Mild to moderate mixed calcific atherosclerosis. 2. Coronary artery disease. 3. Cholelithiasis. 4. 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 No evidence of acute diverticulitis. Electronically Signed   By: Eddie Candle M.D.   On: 05/14/2021 16:55     Assessment and Recommendation  75 y.o. male with known diabetes hypertension hyperlipidemia and acute  non-ST elevation myocardial infarction with overall good ejection fraction although slightly hypokinesis of the anterior apical wall and severe ulcerated ostial left anterior descending artery stenosis 1.  Potential reinstatement of heparin for further risk reduction of myocardial infarction and ulcerated ostial LAD stenosis 2.  Continue current medical regimen including metoprolol lisinopril for myocardial infarction 3.  High intensity cholesterol therapy 4.  Further consultation with cardiovascular surgery today for possible transfer for coronary bypass grafting due to above issues  Signed, Serafina Royals M.D. FACC

## 2021-05-16 NOTE — Progress Notes (Signed)
PROGRESS NOTE    Ralph Marsh  WUJ:[id number] DOB: 08/17/1946 DOA: 05/14/2021 PCP: Marguerita Merles, MD   Chief Complaint  Patient presents with   Chest Pain    Brief Narrative: 75 year old male with a prior DVT, HTN, HLD T2DM, left nephrolithiasis presented with chest pain in the central chest mostly constant, onset since 7/8 afternoon after a meal. Patient was seen in the ED with mild hypertension intermittent tachypnea and hypoxemia requiring 2 L nasal cannula lab with elevated troponin COVID-19 negative CT angio chest abdomen pelvis no evidence of PE, showed cholelithiasis, CAD.  [facility] cardiology was contacted patient was placed on heparin drip for ACS protocol and admitted. Patient had no relief with nitroglycerin but had improvement from morphine.  Subjective: Underwent cardiac cath this morning Resting comfortably on the bed.  Wife at the bedside.  Denies chest pain.  Assessment & Plan:  NSTEMI:Trops elevated with 317>532.  LDL stable at 68.  Appreciate cardiology input except for cardiac cath "Diffuse moderate atherosclerosis of right coronary artery,Occluded proximal circumflex artery with minimal obtuse marginal collateralization from right. Significantly complicated ulcerated plaque of ostial LAD to proximal LAD with some mid LAD stenoses likely culprit".  Noted cardio plan for cardiovascular surgery consult and possible transfer to [facility].  Cont on aspirin, metoprolol, lisinopril and heparin drip.  Patient has a statin intolerance. Spoke w/ Dr Drema Dallas- he has made arrangement for transfer to [facility] under Dr. Desiree Lucy once bed available-hopefully in few days   CAD:See # 1 Hypertension : BP controlled on metoprolol and lisinopril T2DM holding home metformin , sugar fairly stable on sliding scale.   Recent Labs  Lab 05/14/21 2220 05/15/21 0849 05/15/21 1155 05/15/21 1622 05/15/21 2231  GLUCAP 217* 277* 276* 267* 216*   Lab Results  Component Value Date   HGBA1C 8.2 (H)  05/15/2021     Chronic gout cont allopurinol GERD continue PPI  Hx of Left lower extremity DVT in 2018 Overweight w/ BMI 27  Diet Order             Diet clear liquid Room service appropriate? Yes; Fluid consistency: Thin  Diet effective now                   Patient's Body mass index is 27.31 kg/m. DVT prophylaxis: Heparin Code Status:   Code Status: Full Code  Family Communication: plan of care discussed with patient at bedside.  Status is: Inpatient Remains inpatient appropriate because:IV treatments appropriate due to intensity of illness or inability to take PO and Inpatient level of care appropriate due to severity of illness Dispo: The patient is from: Home              Anticipated d/c is to: transfer to another facility.              Patient currently is not medically stable to d/c.   Difficult to place patient No Unresulted Labs (From admission, onward)     Start     Ordered   05/16/21 1300  Heparin level (unfractionated)  Once-Timed,   TIMED       Question:  Specimen collection method  Answer:  Lab=Lab collect   05/16/21 0556   05/16/21 9562  Basic metabolic panel  Daily,   R     Question:  Specimen collection method  Answer:  Lab=Lab collect   05/15/21 0826   05/16/21 0500  CBC  Daily,   R     Question:  Specimen collection  method  Answer:  Lab=Lab collect   05/15/21 0826           Medications reviewed:  Scheduled Meds:  [MAR Hold] allopurinol  300 mg Oral Daily   aspirin       [MAR Hold] aspirin EC  81 mg Oral Daily   [MAR Hold] insulin aspart  0-5 Units Subcutaneous QHS   [MAR Hold] insulin aspart  0-9 Units Subcutaneous TID WC   [MAR Hold] lisinopril  10 mg Oral Daily   [MAR Hold] metoprolol tartrate  12.5 mg Oral BID   [MAR Hold] pantoprazole  80 mg Oral Q1200   [MAR Hold] sodium chloride flush  3 mL Intravenous Q12H   Continuous Infusions:  sodium chloride     [START ON 05/17/2021] sodium chloride 3 mL/kg/hr (05/16/21 0730)   Followed by    Derrill Memo ON 05/17/2021] sodium chloride     sodium chloride     heparin 1,100 Units/hr (05/16/21 1041)   [MAR Hold] nitroGLYCERIN 30 mcg/min (05/15/21 2241)    Consultants:see note  Procedures:see note  Antimicrobials: Anti-infectives (From admission, onward)    None      Culture/Microbiology    Component Value Date/Time   SDES BLOOD RIGHT ASSIST CONTROL 01/20/2017 0602   SPECREQUEST BOTTLES DRAWN AEROBIC AND ANAEROBIC BCHV 01/20/2017 0602   CULT NO GROWTH 5 DAYS 01/20/2017 0602   REPTSTATUS 01/25/2017 FINAL 01/20/2017 0602    Other culture-see note  Objective: Vitals: Today's Vitals   05/16/21 0900 05/16/21 0930 05/16/21 1000 05/16/21 1030  BP: 121/75 114/75 110/69 117/77  Pulse: 90 91 97 92  Resp: 17 17 17  (!) 21  Temp:      TempSrc:      SpO2: 91% 91% 93% 92%  Weight:      Height:      PainSc: 0-No pain 0-No pain 0-No pain 0-No pain    Intake/Output Summary (Last 24 hours) at 05/16/2021 1058 Last data filed at 05/15/2021 2232 Gross per 24 hour  Intake 240 ml  Output 200 ml  Net 40 ml    Filed Weights   05/14/21 1359 05/16/21 0725  Weight: 93 kg 93.9 kg   Weight change:   Intake/Output from previous day: 07/10 0701 - 07/11 0700 In: 240 [P.O.:240] Out: 200 [Urine:200] Intake/Output this shift: No intake/output data recorded. Filed Weights   05/14/21 1359 05/16/21 0725  Weight: 93 kg 93.9 kg    Examination: General exam: AAOx 3 older than stated age, weak appearing. HEENT:Oral mucosa moist, Ear/Nose WNL grossly, dentition normal. Respiratory system: bilaterally clear without crackles, no use of accessory muscle Cardiovascular system: S1 & S2 +, No JVD,. Gastrointestinal system: Abdomen soft, NT,ND, BS+ Nervous System:Alert, awake, moving extremities and grossly nonfocal Extremities: Negative for edema, right radial site for cardiac cath with dressing intact, distal peripheral pulses palpable.  Skin: No rashes,no icterus. MSK: Normal muscle  bulk,tone, power   Data Reviewed: I have personally reviewed following labs and imaging studies CBC: Recent Labs  Lab 05/14/21 1403 05/15/21 0603 05/16/21 0452  WBC 7.7 11.4* 11.3*  HGB 15.7 13.9 13.8  HCT 45.0 39.1 39.7  MCV 91.1 90.5 93.2  PLT 171 154 144*    Basic Metabolic Panel: Recent Labs  Lab 05/14/21 1403 05/15/21 0603 05/16/21 0452  NA 131* 133* 136  K 4.0 3.8 3.5  CL 99 100 102  CO2 19* 26 26  GLUCOSE 282* 273* 207*  BUN 19 17 13   CREATININE 1.11 0.99 0.92  CALCIUM 8.9 8.5* 8.6*  GFR: Estimated Creatinine Clearance: 76 mL/min (by C-G formula based on SCr of 0.92 mg/dL). Liver Function Tests: No results for input(s): AST, ALT, ALKPHOS, BILITOT, PROT, ALBUMIN in the last 168 hours. No results for input(s): LIPASE, AMYLASE in the last 168 hours. No results for input(s): AMMONIA in the last 168 hours. Coagulation Profile: Recent Labs  Lab 05/14/21 1701  INR 1.0    Cardiac Enzymes: No results for input(s): CKTOTAL, CKMB, CKMBINDEX, TROPONINI in the last 168 hours. BNP (last 3 results) No results for input(s): PROBNP in the last 8760 hours. HbA1C: Recent Labs    05/15/21 0603  HGBA1C 8.2*   CBG: Recent Labs  Lab 05/14/21 2220 05/15/21 0849 05/15/21 1155 05/15/21 1622 05/15/21 2231  GLUCAP 217* 277* 276* 267* 216*    Lipid Profile: Recent Labs    05/15/21 0603  CHOL 141  HDL 34*  LDLCALC 68  TRIG 196*  CHOLHDL 4.1    Thyroid Function Tests: No results for input(s): TSH, T4TOTAL, FREET4, T3FREE, THYROIDAB in the last 72 hours. Anemia Panel: No results for input(s): VITAMINB12, FOLATE, FERRITIN, TIBC, IRON, RETICCTPCT in the last 72 hours. Sepsis Labs: No results for input(s): PROCALCITON, LATICACIDVEN in the last 168 hours.  Recent Results (from the past 240 hour(s))  Resp Panel by RT-PCR (Flu A&B, Covid) Nasopharyngeal Swab     Status: None   Collection Time: 05/14/21  3:45 PM   Specimen: Nasopharyngeal Swab;  Nasopharyngeal(NP) swabs in vial transport medium  Result Value Ref Range Status   SARS Coronavirus 2 by RT PCR NEGATIVE NEGATIVE Final    Comment: (NOTE) SARS-CoV-2 target nucleic acids are NOT DETECTED.  The SARS-CoV-2 RNA is generally detectable in upper respiratory specimens during the acute phase of infection. The lowest concentration of SARS-CoV-2 viral copies this assay can detect is 138 copies/mL. A negative result does not preclude SARS-Cov-2 infection and should not be used as the sole basis for treatment or other patient management decisions. A negative result may occur with  improper specimen collection/handling, submission of specimen other than nasopharyngeal swab, presence of viral mutation(s) within the areas targeted by this assay, and inadequate number of viral copies(<138 copies/mL). A negative result must be combined with clinical observations, patient history, and epidemiological information. The expected result is Negative.  Fact Sheet for Patients:  EntrepreneurPulse.com.au  Fact Sheet for Healthcare Providers:  IncredibleEmployment.be  This test is no t yet approved or cleared by the [place] FDA and  has been authorized for detection and/or diagnosis of SARS-CoV-2 by FDA under an Emergency Use Authorization (EUA). This EUA will remain  in effect (meaning this test can be used) for the duration of the COVID-19 declaration under Section 564(b)(1) of the Act, 21 U.S.C.section 360bbb-3(b)(1), unless the authorization is terminated  or revoked sooner.       Influenza A by PCR NEGATIVE NEGATIVE Final   Influenza B by PCR NEGATIVE NEGATIVE Final    Comment: (NOTE) The Xpert Xpress SARS-CoV-2/FLU/RSV plus assay is intended as an aid in the diagnosis of influenza from Nasopharyngeal swab specimens and should not be used as a sole basis for treatment. Nasal washings and aspirates are unacceptable for Xpert Xpress  SARS-CoV-2/FLU/RSV testing.  Fact Sheet for Patients: EntrepreneurPulse.com.au  Fact Sheet for Healthcare Providers: IncredibleEmployment.be  This test is not yet approved or cleared by the [place] FDA and has been authorized for detection and/or diagnosis of SARS-CoV-2 by FDA under an Emergency Use Authorization (EUA). This EUA will remain in effect (meaning  this test can be used) for the duration of the COVID-19 declaration under Section 564(b)(1) of the Act, 21 U.S.C. section 360bbb-3(b)(1), unless the authorization is terminated or revoked.  Performed at [facility], [address], [place], [place] [address]       Radiology Studies: DG Chest 2 View  Result Date: 05/14/2021 CLINICAL DATA:  Chest pain EXAM: CHEST - 2 VIEW COMPARISON:  03/14/2021 FINDINGS: Heart and mediastinal contours are within normal limits. No focal opacities or effusions. No acute bony abnormality. IMPRESSION: No active cardiopulmonary disease. Electronically Signed   By: Rolm Baptise M.D.   On: 05/14/2021 14:34   CARDIAC CATHETERIZATION  Result Date: 05/16/2021  Ost LAD to Prox LAD lesion is 75% stenosed.  Prox LAD to Mid LAD lesion is 85% stenosed with 80% stenosed side branch in 1st Sept.  Mid LAD-1 lesion is 60% stenosed.  Mid LAD-2 lesion is 40% stenosed.  Prox RCA lesion is 60% stenosed.  Mid RCA lesion is 40% stenosed.  Dist RCA lesion is 35% stenosed.  Prox Cx lesion is 100% stenosed.  Dist Cx lesion is 100% stenosed.  77-year-old male with hypertension hyperlipidemia and diabetes having acute non-ST elevation myocardial infarction with elevated troponin of 532 and substernal chest discomfort Left ventricle with hypokinesis of anteroapical wall and ejection fraction of 45% Diffuse moderate atherosclerosis of right coronary artery Occluded proximal circumflex artery with minimal obtuse marginal collateralization from right Significantly  complicated ulcerated plaque of ostial LAD to proximal LAD with some mid LAD stenoses likely culprit Plan High intensity cholesterol therapy Discussion with cardiac team and cardiovascular surgical team for further treatment options including coronary artery bypass surgery Hypertension control and treatment with beta-blocker and ACE inhibitor for cardiovascular risk reduction after non-ST elevation myocardial infarction Cardiac rehabilitation   ECHOCARDIOGRAM COMPLETE  Result Date: 05/15/2021    ECHOCARDIOGRAM REPORT   Patient Name:   MELVILLE ENGEN Date of Exam: 05/15/2021 Medical Rec #:  [id number]       Height:       73.0 in Accession #:    [id number]      Weight:       205.0 lb Date of Birth:  1946/05/19        BSA:          2.174 m Patient Age:    66 years        BP:           130/55 mmHg Patient Gender: M               HR:           88 bpm. Exam Location:  [facility] Procedure: 2D Echo Indications:   nstemi  History:       Patient has no prior history of Echocardiogram examinations.                Angina.  Sonographer:   Carl Best RN Referring      [phone] MATTHEW M ECKSTAT Phys: IMPRESSIONS  1. Left ventricular ejection fraction, by estimation, is 65 to 70%. The left ventricle has normal function. The left ventricle has no regional wall motion abnormalities. Left ventricular diastolic parameters were normal.  2. Right ventricular systolic function is normal. The right ventricular size is normal.  3. The mitral valve is normal in structure. Trivial mitral valve regurgitation.  4. The aortic valve is normal in structure. Aortic valve regurgitation is trivial. FINDINGS  Left Ventricle: Left ventricular ejection fraction, by estimation, is  65 to 70%. The left ventricle has normal function. The left ventricle has no regional wall motion abnormalities. The left ventricular internal cavity size was normal in size. There is  no left ventricular hypertrophy. Left ventricular diastolic parameters were normal. Right  Ventricle: The right ventricular size is normal. No increase in right ventricular wall thickness. Right ventricular systolic function is normal. Left Atrium: Left atrial size was normal in size. Right Atrium: Right atrial size was normal in size. Pericardium: There is no evidence of pericardial effusion. Mitral Valve: The mitral valve is normal in structure. Trivial mitral valve regurgitation. Tricuspid Valve: The tricuspid valve is normal in structure. Tricuspid valve regurgitation is trivial. Aortic Valve: The aortic valve is normal in structure. Aortic valve regurgitation is trivial. Aortic valve peak gradient measures 3.7 mmHg. Pulmonic Valve: The pulmonic valve was normal in structure. Pulmonic valve regurgitation is not visualized. Aorta: The aortic root and ascending aorta are structurally normal, with no evidence of dilitation. IAS/Shunts: No atrial level shunt detected by color flow Doppler.  LEFT VENTRICLE PLAX 2D LVIDd:         3.87 cm  Diastology LVIDs:         2.75 cm  LV e' medial:    5.44 cm/s LV PW:         0.98 cm  LV E/e' medial:  10.0 LV IVS:        0.96 cm  LV e' lateral:   6.74 cm/s LVOT diam:     2.20 cm  LV E/e' lateral: 8.1 LV SV:         50 LV SV Index:   23 LVOT Area:     3.80 cm  AORTIC VALVE AV Area (Vmax): 2.80 cm AV Vmax:        96.40 cm/s AV Peak Grad:   3.7 mmHg LVOT Vmax:      71.10 cm/s LVOT Vmean:     47.100 cm/s LVOT VTI:       0.132 m  AORTA Ao Root diam: 3.40 cm MITRAL VALVE MV Area (PHT): 4.96 cm    SHUNTS MV Decel Time: 153 msec    Systemic VTI:  0.13 m MV E velocity: 54.40 cm/s  Systemic Diam: 2.20 cm MV A velocity: 89.10 cm/s MV E/A ratio:  0.61 Serafina Royals MD Electronically signed by Serafina Royals MD Signature Date/Time: 05/15/2021/12:38:57 PM    Final    CT Angio Chest/Abd/Pel for Dissection W and/or Wo Contrast  Result Date: 05/14/2021 CLINICAL DATA:  Chest pain radiating into arms and back, shortness of breath, nausea, diaphoresis EXAM: CT ANGIOGRAPHY CHEST,  ABDOMEN AND PELVIS TECHNIQUE: Non-contrast CT of the chest was initially obtained. Multidetector CT imaging through the chest, abdomen and pelvis was performed using the standard protocol during bolus administration of intravenous contrast. Multiplanar reconstructed images and MIPs were obtained and reviewed to evaluate the vascular anatomy. CONTRAST:  138mL OMNIPAQUE IOHEXOL 350 MG/ML SOLN COMPARISON:  CT abdomen pelvis, 03/14/2021 FINDINGS: CTA CHEST FINDINGS Cardiovascular: Preferential opacification of the thoracic aorta. Normal contour and caliber of the thoracic aorta. No evidence of aneurysm, dissection, or other acute aortic pathology. Mild mixed calcific atherosclerosis. Normal heart size. Three-vessel coronary artery calcifications. No pericardial effusion. Mediastinum/Nodes: No enlarged mediastinal, hilar, or axillary lymph nodes. Thyroid gland, trachea, and esophagus demonstrate no significant findings. Lungs/Pleura: Lungs are clear. No pleural effusion or pneumothorax. Musculoskeletal: No chest wall abnormality. No acute or significant osseous findings. Review of the MIP images confirms the above findings. CTA ABDOMEN  AND PELVIS FINDINGS VASCULAR Normal contour and caliber of the abdominal aorta. No evidence of aneurysm, dissection, or other acute aortic pathology. Moderate mixed calcific atherosclerosis. Standard branching pattern of the abdominal aorta with solitary bilateral renal arteries. Review of the MIP images confirms the above findings. NON-VASCULAR Hepatobiliary: No solid liver abnormality is seen. Multiple gallstones in the gallbladder. No gallbladder wall thickening, or biliary dilatation. Pancreas: Unremarkable. No pancreatic ductal dilatation or surrounding inflammatory changes. Spleen: Normal in size without significant abnormality. Adrenals/Urinary Tract: Adrenal glands are unremarkable. Kidneys are normal, without renal calculi, solid lesion, or hydronephrosis. Bladder is unremarkable.  Stomach/Bowel: Stomach is within normal limits. Appendix appears normal. No evidence of bowel wall thickening, distention, or inflammatory changes. 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 Lymphatic: No enlarged abdominal or pelvic lymph nodes. Reproductive: No mass or other significant abnormality. Other: No abdominal wall hernia or abnormality. No abdominopelvic ascites. Musculoskeletal: No acute or significant osseous findings. Review of the MIP images confirms the above findings. IMPRESSION: 1. Normal contour and caliber of the thoracic and abdominal aorta. No evidence of aneurysm, dissection, or other acute aortic pathology. Mild to moderate mixed calcific atherosclerosis. 2. Coronary artery disease. 3. Cholelithiasis. 4. 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 No evidence of acute diverticulitis. Electronically Signed   By: Eddie Candle M.D.   On: 05/14/2021 16:55     LOS: 2 days   Antonieta Pert, MD [facility]  05/16/2021, 10:58 AM

## 2021-05-16 NOTE — Progress Notes (Signed)
   05/16/21 1215  Clinical Encounter Type  Visited With Patient and family together  Visit Type Initial;Spiritual support;Social support  Referral From Other (Comment) (rounding)  Spiritual Encounters  Spiritual Needs Other (Comment) (not specified)  Chaplain Burris encountered PT resting comfortably and visiting with spouse. Both appeared to be in good spirits. They were joined by daughter. PT indicated being on restricted diet due to pending procedure. Will attempt to return later in case PT would like an opportunity to discuss any feelings about this privately.

## 2021-05-16 NOTE — Consult Note (Signed)
ANTICOAGULATION CONSULT NOTE   Pharmacy Consult for Heparin Indication: chest pain/ACS  Patient Measurements: Height: 6\' 1"  (185.4 cm) Weight: 93.9 kg (207 lb) IBW/kg (Calculated) : 79.9 Heparin Dosing Weight: 93 kg   Vital Signs: Temp: 98.9 F (37.2 C) (07/11 1935) Temp Source: Oral (07/11 1935) BP: 119/81 (07/11 1935) Pulse Rate: 90 (07/11 1935)  Labs: Recent Labs    05/14/21 1403 05/14/21 1545 05/14/21 1701 05/15/21 0049 05/15/21 0603 05/15/21 2043 05/16/21 0452 05/16/21 1931  HGB 15.7  --   --   --  13.9  --  13.8  --   HCT 45.0  --   --   --  39.1  --  39.7  --   PLT 171  --   --   --  154  --  144*  --   APTT  --   --  29  --   --   --   --   --   LABPROT  --   --  13.6  --   --   --   --   --   INR  --   --  1.0  --   --   --   --   --   HEPARINUNFRC  --   --   --    < >  --  0.71* 0.54 0.28*  CREATININE 1.11  --   --   --  0.99  --  0.92  --   TROPONINIHS 317* 532*  --   --   --   --   --   --    < > = values in this interval not displayed.     Estimated Creatinine Clearance: 76 mL/min (by C-G formula based on SCr of 0.92 mg/dL).   Medical History: Past Medical History:  Diagnosis Date   Arthritis    BPH (benign prostatic hyperplasia)    Diabetes mellitus, type 2 (HCC)    DVT (deep venous thrombosis) ([place]) 05/12/2017   left upper leg   Emphysema of lung (HCC)    mild - on xray   Fatty liver    GERD (gastroesophageal reflux disease)    Hypertension    Kidney stone     Medications:  No PTA anticoagulation  Assessment: 75 y.o. male with a past history of DVT, HTN, HLD, and T2DM who comes ED complaining of chest pain radiating to both arms and the upper back. Troponin trending up from 317 to 532. Pharmacy has been consulted for heparin dosing for ACS.  H/H and plts WNL  7/11 1931 HL = 0.28 1100 > 1300   Goal of Therapy:  anti-Xa level 0.3-0.7 units/ml Monitor platelets by anticoagulation protocol: Yes   Plan:  Heparin level  subtherapeutic. Bolus 1400 units x 1   Increase heparin infusion to 1300 units/hr  Re-check anti-Xa level in 8 hours following rate change Continue to monitor H&H and platelets  Dorothe Pea, PharmD 05/16/2021,8:03 PM

## 2021-05-16 NOTE — Consult Note (Signed)
ANTICOAGULATION CONSULT NOTE   Pharmacy Consult for Heparin Indication: chest pain/ACS  Patient Measurements: Height: 6\' 1"  (185.4 cm) Weight: 93.9 kg (207 lb) IBW/kg (Calculated) : 79.9 Heparin Dosing Weight: 93 kg   Vital Signs: Temp: 98 F (36.7 C) (07/11 1145) Temp Source: Oral (07/11 0725) BP: 116/78 (07/11 1145) Pulse Rate: 91 (07/11 1145)  Labs: Recent Labs    05/14/21 1403 05/14/21 1545 05/14/21 1701 05/15/21 0049 05/15/21 0603 05/15/21 2043 05/16/21 0452  HGB 15.7  --   --   --  13.9  --  13.8  HCT 45.0  --   --   --  39.1  --  39.7  PLT 171  --   --   --  154  --  144*  APTT  --   --  29  --   --   --   --   LABPROT  --   --  13.6  --   --   --   --   INR  --   --  1.0  --   --   --   --   HEPARINUNFRC  --   --   --  0.59  --  0.71* 0.54  CREATININE 1.11  --   --   --  0.99  --  0.92  TROPONINIHS 317* 532*  --   --   --   --   --      Estimated Creatinine Clearance: 76 mL/min (by C-G formula based on SCr of 0.92 mg/dL).   Medical History: Past Medical History:  Diagnosis Date   Arthritis    BPH (benign prostatic hyperplasia)    Diabetes mellitus, type 2 (HCC)    DVT (deep venous thrombosis) ([place]) 05/12/2017   left upper leg   Emphysema of lung (HCC)    mild - on xray   Fatty liver    GERD (gastroesophageal reflux disease)    Hypertension    Kidney stone     Medications:  No PTA anticoagulation  Assessment: 75 y.o. male with a past history of DVT, HTN, HLD, and T2DM who comes ED complaining of chest pain radiating to both arms and the upper back. Troponin trending up from 317 to 532. Pharmacy has been consulted for heparin dosing for ACS.  H/H and plts WNL  Goal of Therapy:  anti-Xa level 0.3-0.7 units/ml Monitor platelets by anticoagulation protocol: Yes   Plan:  Post-cath planning for heparin to resume. Restart at prior therapeutic rate w/o initial bolus at restart; then per nomogram thereafter.  Restart heparin infusion at 1100  units/hr (7/11 @1100 ) Re-check anti-Xa level in 8 hours to confirm (7/11 @1900 ) Continue to monitor H&H and platelets  Lorna Dibble, PharmD 05/16/2021,12:20 PM

## 2021-05-17 DIAGNOSIS — I214 Non-ST elevation (NSTEMI) myocardial infarction: Principal | ICD-10-CM

## 2021-05-17 LAB — CBC
HCT: 40.8 % (ref 39.0–52.0)
Hemoglobin: 14.3 g/dL (ref 13.0–17.0)
MCH: 32.1 pg (ref 26.0–34.0)
MCHC: 35 g/dL (ref 30.0–36.0)
MCV: 91.7 fL (ref 80.0–100.0)
Platelets: 146 10*3/uL — ABNORMAL LOW (ref 150–400)
RBC: 4.45 MIL/uL (ref 4.22–5.81)
RDW: 12.8 % (ref 11.5–15.5)
WBC: 9.4 10*3/uL (ref 4.0–10.5)
nRBC: 0 % (ref 0.0–0.2)

## 2021-05-17 LAB — BASIC METABOLIC PANEL
Anion gap: 5 (ref 5–15)
BUN: 15 mg/dL (ref 8–23)
CO2: 27 mmol/L (ref 22–32)
Calcium: 8.6 mg/dL — ABNORMAL LOW (ref 8.9–10.3)
Chloride: 106 mmol/L (ref 98–111)
Creatinine, Ser: 0.91 mg/dL (ref 0.61–1.24)
GFR, Estimated: >60 mL/min (ref >60)
Glucose, Bld: 181 mg/dL — ABNORMAL HIGH (ref 70–99)
Potassium: 3.6 mmol/L (ref 3.5–5.1)
Sodium: 138 mmol/L (ref 135–145)

## 2021-05-17 LAB — HEPARIN LEVEL (UNFRACTIONATED)
Heparin Unfractionated: 0.58 IU/mL (ref 0.30–0.70)
Heparin Unfractionated: 0.45 IU/mL (ref 0.30–0.70)
Heparin Unfractionated: 0.47 IU/mL (ref 0.30–0.70)

## 2021-05-17 LAB — GLUCOSE, CAPILLARY
Glucose-Capillary: 175 mg/dL — ABNORMAL HIGH (ref 70–99)
Glucose-Capillary: 255 mg/dL — ABNORMAL HIGH (ref 70–99)
Glucose-Capillary: 263 mg/dL — ABNORMAL HIGH (ref 70–99)
Glucose-Capillary: 209 mg/dL — ABNORMAL HIGH (ref 70–99)

## 2021-05-17 NOTE — Consult Note (Signed)
ANTICOAGULATION CONSULT NOTE   Pharmacy Consult for Heparin Indication: chest pain/ACS  Patient Measurements: Height: 6\' 1"  (185.4 cm) Weight: 93.9 kg (207 lb) IBW/kg (Calculated) : 79.9 Heparin Dosing Weight: 93 kg   Vital Signs: Temp: 98.1 F (36.7 C) (07/12 0445) Temp Source: Oral (07/11 1935) BP: 121/82 (07/12 0445) Pulse Rate: 73 (07/12 0445)  Labs: Recent Labs    05/14/21 1403 05/14/21 1545 05/14/21 1701 05/15/21 0049 05/15/21 0603 05/15/21 2043 05/16/21 0452 05/16/21 1931 05/17/21 0515  HGB 15.7  --   --   --  13.9  --  13.8  --  14.3  HCT 45.0  --   --   --  39.1  --  39.7  --  40.8  PLT 171  --   --   --  154  --  144*  --  146*  APTT  --   --  29  --   --   --   --   --   --   LABPROT  --   --  13.6  --   --   --   --   --   --   INR  --   --  1.0  --   --   --   --   --   --   HEPARINUNFRC  --   --   --    < >  --    < > 0.54 0.28* 0.58  CREATININE 1.11  --   --   --  0.99  --  0.92  --  0.91  TROPONINIHS 317* 532*  --   --   --   --   --   --   --    < > = values in this interval not displayed.     Estimated Creatinine Clearance: 76.8 mL/min (by C-G formula based on SCr of 0.91 mg/dL).   Medical History: Past Medical History:  Diagnosis Date   Arthritis    BPH (benign prostatic hyperplasia)    Diabetes mellitus, type 2 (HCC)    DVT (deep venous thrombosis) ([place]) 05/12/2017   left upper leg   Emphysema of lung (HCC)    mild - on xray   Fatty liver    GERD (gastroesophageal reflux disease)    Hypertension    Kidney stone     Medications:  No PTA anticoagulation  Assessment: 75 y.o. male with a past history of DVT, HTN, HLD, and T2DM who comes ED complaining of chest pain radiating to both arms and the upper back. Troponin trending up from 317 to 532. Pharmacy has been consulted for heparin dosing for ACS.  H/H and plts WNL  7/11 1931 HL = 0.28 1100 > 1300  7/12 0515 HL = 0.58, therapeutic x 1  Goal of Therapy:  anti-Xa level 0.3-0.7  units/ml Monitor platelets by anticoagulation protocol: Yes   Plan:  Continue heparin infusion at 1300 units/hr  Re-check anti-Xa level in 8 hours to confirm Continue to monitor H&H and platelets  Renda Rolls, PharmD, [facility] 05/17/2021 5:59 AM

## 2021-05-17 NOTE — Progress Notes (Addendum)
PROGRESS NOTE    Ralph Marsh  QZR:[id number] DOB: [birth year] DOA: 05/14/2021 PCP: Marguerita Merles, MD   Chief Complaint  Patient presents with   Chest Pain    Brief Narrative: 75 year old male with a prior DVT, HTN, HLD T2DM, left nephrolithiasis presented with chest pain in the central chest mostly constant, onset since 7/8 afternoon after a meal. Patient was seen in the ED with mild hypertension intermittent tachypnea and hypoxemia requiring 2 L nasal cannula lab with elevated troponin COVID-19 negative CT angio chest abdomen pelvis no evidence of PE, showed cholelithiasis, CAD.  [facility] cardiology was contacted patient was placed on heparin drip for ACS protocol and admitted. Patient had no relief with nitroglycerin but had improvement from morphine.  Subjective: No chest pain.  Pt reported doing well.  Normal oral intake.  Normal urination.  Finally had BM today.   Assessment & Plan:  NSTEMI:Trops elevated with 317>532.  LDL stable at 68.  Appreciate cardiology input except for cardiac cath "Diffuse moderate atherosclerosis of right coronary artery,Occluded proximal circumflex artery with minimal obtuse marginal collateralization from right. Significantly complicated ulcerated plaque of ostial LAD to proximal LAD with some mid LAD stenoses likely culprit".  Noted cardio plan for cardiovascular surgery consult and possible transfer to [facility].  Cont on aspirin, metoprolol, lisinopril and heparin drip.  Patient has a statin intolerance. Spoke w/ Dr Drema Dallas- he has made arrangement for transfer to [facility] under Dr. Desiree Lucy once bed available.  CAD:See # 1 Hypertension : BP controlled on metoprolol and lisinopril T2DM holding home metformin  Increase Levemir to 14u daily SSI  Chronic gout cont allopurinol GERD continue PPI  Hx of Left lower extremity DVT in 2018 Overweight w/ BMI 27  Diet Order             Diet heart healthy/carb modified Room service appropriate? Yes; Fluid  consistency: Thin  Diet effective now                   Patient's Body mass index is 27.31 kg/m. DVT prophylaxis: Heparin Code Status:   Code Status: Full Code  Family Communication: wife updated at bedside today.  Status is: Inpatient Remains inpatient appropriate   Dispo: The patient is from: Home              Anticipated d/c is to: transfer to [facility]              Patient currently is not medically stable to d/c.   Difficult to place patient No  Unresulted Labs (From admission, onward)     Start     Ordered   05/17/21 2100  Heparin level (unfractionated)  Once-Timed,   TIMED       Question:  Specimen collection method  Answer:  Lab=Lab collect   05/17/21 1537   05/16/21 3545  Basic metabolic panel  Daily,   R     Question:  Specimen collection method  Answer:  Lab=Lab collect   05/15/21 0826   05/16/21 0500  CBC  Daily,   R     Question:  Specimen collection method  Answer:  Lab=Lab collect   05/15/21 0826           Medications reviewed:  Scheduled Meds:  allopurinol  300 mg Oral Daily   aspirin EC  81 mg Oral Daily   insulin aspart  0-5 Units Subcutaneous QHS   insulin aspart  0-9 Units Subcutaneous TID WC   insulin detemir  10  Units Subcutaneous QHS   lisinopril  10 mg Oral Daily   metoprolol tartrate  12.5 mg Oral BID   pantoprazole  80 mg Oral Q1200   sodium chloride flush  3 mL Intravenous Q12H   Continuous Infusions:  heparin 1,300 Units/hr (05/17/21 1208)   nitroGLYCERIN Stopped (05/16/21 1120)    Consultants:see note  Procedures:see note  Antimicrobials: Anti-infectives (From admission, onward)    None      Culture/Microbiology    Component Value Date/Time   SDES BLOOD RIGHT ASSIST CONTROL 01/20/2017 0602   SPECREQUEST BOTTLES DRAWN AEROBIC AND ANAEROBIC BCHV 01/20/2017 0602   CULT NO GROWTH 5 DAYS 01/20/2017 0602   REPTSTATUS 01/25/2017 FINAL 01/20/2017 0602    Other culture-see note  Objective: Vitals: Today's Vitals   05/17/21  0732 05/17/21 0815 05/17/21 1133 05/17/21 1624  BP: 113/80  114/78 117/78  Pulse: 79  76 82  Resp: 18  18 19   Temp: 98.1 F (36.7 C)  97.9 F (36.6 C) 98 F (36.7 C)  TempSrc:   Oral   SpO2: 94%  95% 95%  Weight:      Height:      PainSc:  0-No pain      Intake/Output Summary (Last 24 hours) at 05/17/2021 1653 Last data filed at 05/17/2021 1343 Gross per 24 hour  Intake 1683 ml  Output 500 ml  Net 1183 ml   Filed Weights   05/14/21 1359 05/16/21 0725  Weight: 93 kg 93.9 kg   Weight change:   Intake/Output from previous day: 07/11 0701 - 07/12 0700 In: 1683 [P.O.:1683] Out: 500 [Urine:500] Intake/Output this shift: Total I/O In: 240 [P.O.:240] Out: -  Filed Weights   05/14/21 1359 05/16/21 0725  Weight: 93 kg 93.9 kg    Examination: Constitutional: NAD, AAOx3 HEENT: conjunctivae and lids normal, EOMI CV: No cyanosis.   RESP: normal respiratory effort, on RA Extremities: No effusions, edema in BLE SKIN: warm, dry Neuro: II - XII grossly intact.   Psych: Normal mood and affect.  Appropriate judgement and reason    Data Reviewed: I have personally reviewed following labs and imaging studies CBC: Recent Labs  Lab 05/14/21 1403 05/15/21 0603 05/16/21 0452 05/17/21 0515  WBC 7.7 11.4* 11.3* 9.4  HGB 15.7 13.9 13.8 14.3  HCT 45.0 39.1 39.7 40.8  MCV 91.1 90.5 93.2 91.7  PLT 171 154 144* 956*   Basic Metabolic Panel: Recent Labs  Lab 05/14/21 1403 05/15/21 0603 05/16/21 0452 05/17/21 0515  NA 131* 133* 136 138  K 4.0 3.8 3.5 3.6  CL 99 100 102 106  CO2 19* 26 26 27   GLUCOSE 282* 273* 207* 181*  BUN 19 17 13 15   CREATININE 1.11 0.99 0.92 0.91  CALCIUM 8.9 8.5* 8.6* 8.6*   GFR: Estimated Creatinine Clearance: 76.8 mL/min (by C-G formula based on SCr of 0.91 mg/dL). Liver Function Tests: No results for input(s): AST, ALT, ALKPHOS, BILITOT, PROT, ALBUMIN in the last 168 hours. No results for input(s): LIPASE, AMYLASE in the last 168 hours. No  results for input(s): AMMONIA in the last 168 hours. Coagulation Profile: Recent Labs  Lab 05/14/21 1701  INR 1.0   Cardiac Enzymes: No results for input(s): CKTOTAL, CKMB, CKMBINDEX, TROPONINI in the last 168 hours. BNP (last 3 results) No results for input(s): PROBNP in the last 8760 hours. HbA1C: Recent Labs    05/15/21 0603  HGBA1C 8.2*   CBG: Recent Labs  Lab 05/16/21 1549 05/16/21 2110 05/17/21 0733 05/17/21 1135 05/17/21  [place]   Lipid Profile: Recent Labs    05/15/21 0603  CHOL 141  HDL 34*  LDLCALC 68  TRIG 196*  CHOLHDL 4.1   Thyroid Function Tests: No results for input(s): TSH, T4TOTAL, FREET4, T3FREE, THYROIDAB in the last 72 hours. Anemia Panel: No results for input(s): VITAMINB12, FOLATE, FERRITIN, TIBC, IRON, RETICCTPCT in the last 72 hours. Sepsis Labs: No results for input(s): PROCALCITON, LATICACIDVEN in the last 168 hours.  Recent Results (from the past 240 hour(s))  Resp Panel by RT-PCR (Flu A&B, Covid) Nasopharyngeal Swab     Status: None   Collection Time: 05/14/21  3:45 PM   Specimen: Nasopharyngeal Swab; Nasopharyngeal(NP) swabs in vial transport medium  Result Value Ref Range Status   SARS Coronavirus 2 by RT PCR NEGATIVE NEGATIVE Final    Comment: (NOTE) SARS-CoV-2 target nucleic acids are NOT DETECTED.  The SARS-CoV-2 RNA is generally detectable in upper respiratory specimens during the acute phase of infection. The lowest concentration of SARS-CoV-2 viral copies this assay can detect is 138 copies/mL. A negative result does not preclude SARS-Cov-2 infection and should not be used as the sole basis for treatment or other patient management decisions. A negative result may occur with  improper specimen collection/handling, submission of specimen other than nasopharyngeal swab, presence of viral mutation(s) within the areas targeted by this assay, and inadequate number of viral copies(<138  copies/mL). A negative result must be combined with clinical observations, patient history, and epidemiological information. The expected result is Negative.  Fact Sheet for Patients:  EntrepreneurPulse.com.au  Fact Sheet for Healthcare Providers:  IncredibleEmployment.be  This test is no t yet approved or cleared by the [place] FDA and  has been authorized for detection and/or diagnosis of SARS-CoV-2 by FDA under an Emergency Use Authorization (EUA). This EUA will remain  in effect (meaning this test can be used) for the duration of the COVID-19 declaration under Section 564(b)(1) of the Act, 21 U.S.C.section 360bbb-3(b)(1), unless the authorization is terminated  or revoked sooner.       Influenza A by PCR NEGATIVE NEGATIVE Final   Influenza B by PCR NEGATIVE NEGATIVE Final    Comment: (NOTE) The Xpert Xpress SARS-CoV-2/FLU/RSV plus assay is intended as an aid in the diagnosis of influenza from Nasopharyngeal swab specimens and should not be used as a sole basis for treatment. Nasal washings and aspirates are unacceptable for Xpert Xpress SARS-CoV-2/FLU/RSV testing.  Fact Sheet for Patients: EntrepreneurPulse.com.au  Fact Sheet for Healthcare Providers: IncredibleEmployment.be  This test is not yet approved or cleared by the [place] FDA and has been authorized for detection and/or diagnosis of SARS-CoV-2 by FDA under an Emergency Use Authorization (EUA). This EUA will remain in effect (meaning this test can be used) for the duration of the COVID-19 declaration under Section 564(b)(1) of the Act, 21 U.S.C. section 360bbb-3(b)(1), unless the authorization is terminated or revoked.  Performed at [facility], [address], [place], [place] [address]      Radiology Studies: CARDIAC CATHETERIZATION  Result Date: 05/16/2021  Ost LAD to Prox LAD lesion is 75% stenosed.  Prox  LAD to Mid LAD lesion is 85% stenosed with 80% stenosed side branch in 1st Sept.  Mid LAD-1 lesion is 60% stenosed.  Mid LAD-2 lesion is 40% stenosed.  Prox RCA lesion is 60% stenosed.  Mid RCA lesion is 40% stenosed.  Dist RCA lesion is 35% stenosed.  Prox Cx lesion is 100% stenosed.  Dist Cx lesion is 100% stenosed.  36-year-old male with hypertension hyperlipidemia and diabetes having acute non-ST elevation myocardial infarction with elevated troponin of 532 and substernal chest discomfort Left ventricle with hypokinesis of anteroapical wall and ejection fraction of 45% Diffuse moderate atherosclerosis of right coronary artery Occluded proximal circumflex artery with minimal obtuse marginal collateralization from right Significantly complicated ulcerated plaque of ostial LAD to proximal LAD with some mid LAD stenoses likely culprit Plan High intensity cholesterol therapy Discussion with cardiac team and cardiovascular surgical team for further treatment options including coronary artery bypass surgery Hypertension control and treatment with beta-blocker and ACE inhibitor for cardiovascular risk reduction after non-ST elevation myocardial infarction Cardiac rehabilitation     LOS: 3 days   Enzo Bi, MD [facility]  05/17/2021, 4:53 PM

## 2021-05-17 NOTE — Progress Notes (Signed)
[facility] Cardiology [facility] Encounter Note  Patient: Ralph Marsh / Admit Date: 05/14/2021 / Date of Encounter: 05/17/2021, 8:40 AM   Subjective: Overall patient doing well today with no further evidence of chest discomfort.  Echocardiogram showing normal LV systolic function    Cardiac catheterization showing occluded proximal left circumflex with right to left collaterals to small obtuse marginal moderate atherosclerosis of right coronary artery and severe ulcerated ostial left anterior descending artery disease.  Long discussion with cardiovascular team including cardiovascular surgery with the best long-term treatment options including coronary artery bypass surgery  Review of Systems: Positive for: None Negative for: Vision change, hearing change, syncope, dizziness, nausea, vomiting,diarrhea, bloody stool, stomach pain, cough, congestion, diaphoresis, urinary frequency, urinary pain,skin lesions, skin rashes Others previously listed  Objective: Telemetry: Normal sinus rhythm Physical Exam: Blood pressure 113/80, pulse 79, temperature 98.1 F (36.7 C), resp. rate 18, height 6\' 1"  (1.854 m), weight 93.9 kg, SpO2 94 %. Body mass index is 27.31 kg/m. General: Well developed, well nourished, in no acute distress. Head: Normocephalic, atraumatic, sclera non-icteric, no xanthomas, nares are without discharge. Neck: No apparent masses Lungs: Normal respirations with no wheezes, no rhonchi, no rales , no crackles   Heart: Regular rate and rhythm, normal S1 S2, no murmur, no rub, no gallop, PMI is normal size and placement, carotid upstroke normal without bruit, jugular venous pressure normal Abdomen: Soft, non-tender, non-distended with normoactive bowel sounds. No hepatosplenomegaly. Abdominal aorta is normal size without bruit Extremities: No edema, no clubbing, no cyanosis, no ulcers,  Peripheral: 2+ radial, 2+ femoral, 2+ dorsal pedal pulses Neuro: Alert and oriented. Moves all  extremities spontaneously. Psych:  Responds to questions appropriately with a normal affect.   Intake/Output Summary (Last 24 hours) at 05/17/2021 0840 Last data filed at 05/16/2021 2100 Gross per 24 hour  Intake 1683 ml  Output 500 ml  Net 1183 ml     Inpatient Medications:  . allopurinol  300 mg Oral Daily  . aspirin EC  81 mg Oral Daily  . insulin aspart  0-5 Units Subcutaneous QHS  . insulin aspart  0-9 Units Subcutaneous TID WC  . insulin detemir  10 Units Subcutaneous QHS  . lisinopril  10 mg Oral Daily  . metoprolol tartrate  12.5 mg Oral BID  . pantoprazole  80 mg Oral Q1200  . sodium chloride flush  3 mL Intravenous Q12H   Infusions:  . heparin 1,300 Units/hr (05/16/21 2033)  . nitroGLYCERIN Stopped (05/16/21 1120)    Labs: Recent Labs    05/16/21 0452 05/17/21 0515  NA 136 138  K 3.5 3.6  CL 102 106  CO2 26 27  GLUCOSE 207* 181*  BUN 13 15  CREATININE 0.92 0.91  CALCIUM 8.6* 8.6*    No results for input(s): AST, ALT, ALKPHOS, BILITOT, PROT, ALBUMIN in the last 72 hours. Recent Labs    05/16/21 0452 05/17/21 0515  WBC 11.3* 9.4  HGB 13.8 14.3  HCT 39.7 40.8  MCV 93.2 91.7  PLT 144* 146*    No results for input(s): CKTOTAL, CKMB, TROPONINI in the last 72 hours. Invalid input(s): POCBNP Recent Labs    05/15/21 0603  HGBA1C 8.2*      Weights: Filed Weights   05/14/21 1359 05/16/21 0725  Weight: 93 kg 93.9 kg     Radiology/Studies:  DG Chest 2 View  Result Date: 05/14/2021 CLINICAL DATA:  Chest pain EXAM: CHEST - 2 VIEW COMPARISON:  03/14/2021 FINDINGS: Heart and mediastinal contours are  within normal limits. No focal opacities or effusions. No acute bony abnormality. IMPRESSION: No active cardiopulmonary disease. Electronically Signed   By: Rolm Baptise M.D.   On: 05/14/2021 14:34   CARDIAC CATHETERIZATION  Result Date: 05/16/2021  Ost LAD to Prox LAD lesion is 75% stenosed.  Prox LAD to Mid LAD lesion is 85% stenosed with 80%  stenosed side branch in 1st Sept.  Mid LAD-1 lesion is 60% stenosed.  Mid LAD-2 lesion is 40% stenosed.  Prox RCA lesion is 60% stenosed.  Mid RCA lesion is 40% stenosed.  Dist RCA lesion is 35% stenosed.  Prox Cx lesion is 100% stenosed.  Dist Cx lesion is 100% stenosed.  75-year-old male with hypertension hyperlipidemia and diabetes having acute non-ST elevation myocardial infarction with elevated troponin of 532 and substernal chest discomfort Left ventricle with hypokinesis of anteroapical wall and ejection fraction of 45% Diffuse moderate atherosclerosis of right coronary artery Occluded proximal circumflex artery with minimal obtuse marginal collateralization from right Significantly complicated ulcerated plaque of ostial LAD to proximal LAD with some mid LAD stenoses likely culprit Plan High intensity cholesterol therapy Discussion with cardiac team and cardiovascular surgical team for further treatment options including coronary artery bypass surgery Hypertension control and treatment with beta-blocker and ACE inhibitor for cardiovascular risk reduction after non-ST elevation myocardial infarction Cardiac rehabilitation   ECHOCARDIOGRAM COMPLETE  Result Date: 05/15/2021    ECHOCARDIOGRAM REPORT   Patient Name:   Ralph Marsh Date of Exam: 05/15/2021 Medical Rec #:  [id number]       Height:       73.0 in Accession #:    [id number]      Weight:       205.0 lb Date of Birth:  06-12-1946        BSA:          2.174 m Patient Age:    75 years        BP:           130/55 mmHg Patient Gender: M               HR:           88 bpm. Exam Location:  [facility] Procedure: 2D Echo Indications:   nstemi  History:       Patient has no prior history of Echocardiogram examinations.                Angina.  Sonographer:   Carl Best RN Referring      [phone] MATTHEW M ECKSTAT Phys: IMPRESSIONS  1. Left ventricular ejection fraction, by estimation, is 65 to 70%. The left ventricle has normal function. The left ventricle has  no regional wall motion abnormalities. Left ventricular diastolic parameters were normal.  2. Right ventricular systolic function is normal. The right ventricular size is normal.  3. The mitral valve is normal in structure. Trivial mitral valve regurgitation.  4. The aortic valve is normal in structure. Aortic valve regurgitation is trivial. FINDINGS  Left Ventricle: Left ventricular ejection fraction, by estimation, is 65 to 70%. The left ventricle has normal function. The left ventricle has no regional wall motion abnormalities. The left ventricular internal cavity size was normal in size. There is  no left ventricular hypertrophy. Left ventricular diastolic parameters were normal. Right Ventricle: The right ventricular size is normal. No increase in right ventricular wall thickness. Right ventricular systolic function is normal. Left Atrium: Left atrial size was normal in size. Right Atrium: Right atrial size  was normal in size. Pericardium: There is no evidence of pericardial effusion. Mitral Valve: The mitral valve is normal in structure. Trivial mitral valve regurgitation. Tricuspid Valve: The tricuspid valve is normal in structure. Tricuspid valve regurgitation is trivial. Aortic Valve: The aortic valve is normal in structure. Aortic valve regurgitation is trivial. Aortic valve peak gradient measures 3.7 mmHg. Pulmonic Valve: The pulmonic valve was normal in structure. Pulmonic valve regurgitation is not visualized. Aorta: The aortic root and ascending aorta are structurally normal, with no evidence of dilitation. IAS/Shunts: No atrial level shunt detected by color flow Doppler.  LEFT VENTRICLE PLAX 2D LVIDd:         3.87 cm  Diastology LVIDs:         2.75 cm  LV e' medial:    5.44 cm/s LV PW:         0.98 cm  LV E/e' medial:  10.0 LV IVS:        0.96 cm  LV e' lateral:   6.74 cm/s LVOT diam:     2.20 cm  LV E/e' lateral: 8.1 LV SV:         50 LV SV Index:   23 LVOT Area:     3.80 cm  AORTIC VALVE AV Area  (Vmax): 2.80 cm AV Vmax:        96.40 cm/s AV Peak Grad:   3.7 mmHg LVOT Vmax:      71.10 cm/s LVOT Vmean:     47.100 cm/s LVOT VTI:       0.132 m  AORTA Ao Root diam: 3.40 cm MITRAL VALVE MV Area (PHT): 4.96 cm    SHUNTS MV Decel Time: 153 msec    Systemic VTI:  0.13 m MV E velocity: 54.40 cm/s  Systemic Diam: 2.20 cm MV A velocity: 89.10 cm/s MV E/A ratio:  0.61 Serafina Royals MD Electronically signed by Serafina Royals MD Signature Date/Time: 05/15/2021/12:38:57 PM    Final    CT Angio Chest/Abd/Pel for Dissection W and/or Wo Contrast  Result Date: 05/14/2021 CLINICAL DATA:  Chest pain radiating into arms and back, shortness of breath, nausea, diaphoresis EXAM: CT ANGIOGRAPHY CHEST, ABDOMEN AND PELVIS TECHNIQUE: Non-contrast CT of the chest was initially obtained. Multidetector CT imaging through the chest, abdomen and pelvis was performed using the standard protocol during bolus administration of intravenous contrast. Multiplanar reconstructed images and MIPs were obtained and reviewed to evaluate the vascular anatomy. CONTRAST:  156mL OMNIPAQUE IOHEXOL 350 MG/ML SOLN COMPARISON:  CT abdomen pelvis, 03/14/2021 FINDINGS: CTA CHEST FINDINGS Cardiovascular: Preferential opacification of the thoracic aorta. Normal contour and caliber of the thoracic aorta. No evidence of aneurysm, dissection, or other acute aortic pathology. Mild mixed calcific atherosclerosis. Normal heart size. Three-vessel coronary artery calcifications. No pericardial effusion. Mediastinum/Nodes: No enlarged mediastinal, hilar, or axillary lymph nodes. Thyroid gland, trachea, and esophagus demonstrate no significant findings. Lungs/Pleura: Lungs are clear. No pleural effusion or pneumothorax. Musculoskeletal: No chest wall abnormality. No acute or significant osseous findings. Review of the MIP images confirms the above findings. CTA ABDOMEN AND PELVIS FINDINGS VASCULAR Normal contour and caliber of the abdominal aorta. No evidence of  aneurysm, dissection, or other acute aortic pathology. Moderate mixed calcific atherosclerosis. Standard branching pattern of the abdominal aorta with solitary bilateral renal arteries. Review of the MIP images confirms the above findings. NON-VASCULAR Hepatobiliary: No solid liver abnormality is seen. Multiple gallstones in the gallbladder. No gallbladder wall thickening, or biliary dilatation. Pancreas: Unremarkable. No pancreatic ductal dilatation or surrounding inflammatory  changes. Spleen: Normal in size without significant abnormality. Adrenals/Urinary Tract: Adrenal glands are unremarkable. Kidneys are normal, without renal calculi, solid lesion, or hydronephrosis. Bladder is unremarkable. Stomach/Bowel: Stomach is within normal limits. Appendix appears normal. No evidence of bowel wall thickening, distention, or inflammatory changes. 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 Lymphatic: No enlarged abdominal or pelvic lymph nodes. Reproductive: No mass or other significant abnormality. Other: No abdominal wall hernia or abnormality. No abdominopelvic ascites. Musculoskeletal: No acute or significant osseous findings. Review of the MIP images confirms the above findings. IMPRESSION: 1. Normal contour and caliber of the thoracic and abdominal aorta. No evidence of aneurysm, dissection, or other acute aortic pathology. Mild to moderate mixed calcific atherosclerosis. 2. Coronary artery disease. 3. Cholelithiasis. 4. 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 No evidence of acute diverticulitis. Electronically Signed   By: Eddie Candle M.D.   On: 05/14/2021 16:55     Assessment and Recommendation  75 y.o. male with known diabetes hypertension hyperlipidemia and acute non-ST elevation myocardial infarction with overall good ejection fraction although slightly hypokinesis of the anterior apical wall and severe ulcerated ostial left anterior descending artery stenosis 1.  Continuation of heparin  for further risk reduction of myocardial infarction and ulcerated ostial LAD stenosis 2.  Continue current medical regimen including metoprolol lisinopril for myocardial infarction 3.  High intensity cholesterol therapy 4.  Transfer to [facility] for coronary artery bypass surgery with accepting physician Dr. Desiree Lucy occurring when bed is available  Signed, Serafina Royals M.D. FACC

## 2021-05-17 NOTE — Progress Notes (Signed)
Inpatient Diabetes Program Recommendations  AACE/ADA: New Consensus Statement on Inpatient Glycemic Control (2015)  Target Ranges:  Prepandial:   less than 140 mg/dL      Peak postprandial:   less than 180 mg/dL (1-2 hours)      Critically ill patients:  140 - 180 mg/dL   Lab Results  Component Value Date   GLUCAP 255 (H) 05/17/2021   HGBA1C 8.2 (H) 05/15/2021    Review of Glycemic Control Results for Ralph Marsh, Ralph Marsh (MRN [id number]) as of 05/17/2021 13:34  Ref. Range 05/16/2021 15:49 05/16/2021 21:10 05/17/2021 07:33 05/17/2021 11:35  Glucose-Capillary Latest Ref Range: 70 - 99 mg/dL 222 (H) 218 (H) 175 (H) 255 (H)   Diabetes history: Type 2 DM Outpatient Diabetes medications: Metformin 1000 mg QA/ 500 mg QPM Current orders for Inpatient glycemic control: Novolog 0-9 units TID & HS, Levemir 10 units QHS   Inpatient Diabetes Program Recommendations:     Consider slightly increasing Levemir 14 units QD.  Thanks, Bronson Curb, MSN, RNC-OB Diabetes Coordinator [phone] (8a-5p)

## 2021-05-18 DIAGNOSIS — I82409 Acute embolism and thrombosis of unspecified deep veins of unspecified lower extremity: Secondary | ICD-10-CM | POA: Insufficient documentation

## 2021-05-18 DIAGNOSIS — I2510 Atherosclerotic heart disease of native coronary artery without angina pectoris: Secondary | ICD-10-CM | POA: Insufficient documentation

## 2021-05-18 DIAGNOSIS — E119 Type 2 diabetes mellitus without complications: Secondary | ICD-10-CM

## 2021-05-18 LAB — CBC
HCT: 39.5 % (ref 39.0–52.0)
Hemoglobin: 13.8 g/dL (ref 13.0–17.0)
MCH: 32.6 pg (ref 26.0–34.0)
MCHC: 34.9 g/dL (ref 30.0–36.0)
MCV: 93.4 fL (ref 80.0–100.0)
Platelets: 155 10*3/uL (ref 150–400)
RBC: 4.23 MIL/uL (ref 4.22–5.81)
RDW: 13.2 % (ref 11.5–15.5)
WBC: 8.5 10*3/uL (ref 4.0–10.5)
nRBC: 0 % (ref 0.0–0.2)

## 2021-05-18 LAB — BASIC METABOLIC PANEL
Anion gap: 8 (ref 5–15)
BUN: 18 mg/dL (ref 8–23)
CO2: 25 mmol/L (ref 22–32)
Calcium: 8.7 mg/dL — ABNORMAL LOW (ref 8.9–10.3)
Chloride: 105 mmol/L (ref 98–111)
Creatinine, Ser: 1.06 mg/dL (ref 0.61–1.24)
GFR, Estimated: >60 mL/min (ref >60)
Glucose, Bld: 186 mg/dL — ABNORMAL HIGH (ref 70–99)
Potassium: 3.6 mmol/L (ref 3.5–5.1)
Sodium: 138 mmol/L (ref 135–145)

## 2021-05-18 LAB — GLUCOSE, CAPILLARY
Glucose-Capillary: 203 mg/dL — ABNORMAL HIGH (ref 70–99)
Glucose-Capillary: 244 mg/dL — ABNORMAL HIGH (ref 70–99)

## 2021-05-18 LAB — HEPARIN LEVEL (UNFRACTIONATED): Heparin Unfractionated: 0.64 IU/mL (ref 0.30–0.70)

## 2021-05-18 LAB — MAGNESIUM: Magnesium: 1.9 mg/dL (ref 1.7–2.4)

## 2021-05-18 MED ORDER — INSULIN DETEMIR 100 UNIT/ML ~~LOC~~ SOLN
14.0000 [IU] | Freq: Every day | SUBCUTANEOUS | Status: DC
  Filled 2021-05-18: qty 0.14

## 2021-05-18 MED ORDER — HEPARIN (PORCINE) 25000 UT/250ML-% IV SOLN: 1300.0000 [IU]/h | INTRAVENOUS | Status: DC

## 2021-05-18 MED ORDER — INSULIN DETEMIR 100 UNIT/ML ~~LOC~~ SOLN: 14.0000 [IU] | Freq: Every day | SUBCUTANEOUS | 11 refills | Status: DC

## 2021-05-18 MED ORDER — NITROGLYCERIN 0.4 MG SL SUBL: 0.4000 mg | SUBLINGUAL_TABLET | SUBLINGUAL | 12 refills | Status: DC | PRN

## 2021-05-18 MED ORDER — METOPROLOL TARTRATE 25 MG PO TABS: 12.5000 mg | ORAL_TABLET | Freq: Two times a day (BID) | ORAL | Status: DC

## 2021-05-18 NOTE — Consult Note (Signed)
ANTICOAGULATION CONSULT NOTE   Pharmacy Consult for Heparin Indication: chest pain/ACS  Patient Measurements: Height: 6\' 1"  (185.4 cm) Weight: 93.9 kg (207 lb) IBW/kg (Calculated) : 79.9 Heparin Dosing Weight: 93 kg   Vital Signs: Temp: 98.3 F (36.8 C) (07/13 0736) BP: 121/80 (07/13 0736) Pulse Rate: 76 (07/13 0736)  Labs: Recent Labs    05/16/21 0452 05/16/21 1931 05/17/21 0515 05/17/21 1305 05/17/21 2135 05/18/21 0554  HGB 13.8  --  14.3  --   --  13.8  HCT 39.7  --  40.8  --   --  39.5  PLT 144*  --  146*  --   --  155  HEPARINUNFRC 0.54   < > 0.58 0.45 0.47 0.64  CREATININE 0.92  --  0.91  --   --  1.06   < > = values in this interval not displayed.     Estimated Creatinine Clearance: 66 mL/min (by C-G formula based on SCr of 1.06 mg/dL).   Medical History: Past Medical History:  Diagnosis Date   Arthritis    BPH (benign prostatic hyperplasia)    Diabetes mellitus, type 2 (HCC)    DVT (deep venous thrombosis) ([place]) 05/12/2017   left upper leg   Emphysema of lung (HCC)    mild - on xray   Fatty liver    GERD (gastroesophageal reflux disease)    Hypertension    Kidney stone     Medications:  No PTA anticoagulation  Assessment: 75 y.o. male with a past history of DVT, HTN, HLD, and T2DM who comes ED complaining of chest pain radiating to both arms and the upper back. Troponin trending up from 317 to 532. Pharmacy has been consulted for heparin dosing for ACS.  H/H and plts WNL  7/11 1931 HL = 0.28 1100 > 1300  7/12 0515 HL = 0.58, therapeutic x 1; 1300 un/hr 7/12 1305 HL = 0.45, therapeutic x2; 1300 un/hr 7/12 2135 HL= 0.47 therapeutic x3; 1300 un/hr 7/13 0554 HL = 0.64; therapeutic x 4; 1300 un/hr  Goal of Therapy:  anti-Xa level 0.3-0.7 units/ml Monitor platelets by anticoagulation protocol: Yes   Plan:  Heparin therapeutic consecutively; will change to daily monitoring. Continue heparin infusion at 1300 units/hr  CTM anti-Xa level daily  with AM labs Continue to monitor H&H and platelets  Lorna Dibble, [facility] 05/18/2021 8:17 AM

## 2021-05-18 NOTE — Progress Notes (Signed)
Patient transferred to [facility] via life flight.  Report called and given to [place].  Patient without complaints prior to transfer.

## 2021-05-18 NOTE — Progress Notes (Signed)
[facility] Encounter Note  Patient: Ralph Marsh / Admit Date: 05/14/2021 / Date of Encounter: 05/18/2021, 7:45 AM   Subjective: Overall patient doing well today with no further evidence of chest discomfort.  Echocardiogram showing normal LV systolic function    Cardiac catheterization showing occluded proximal left circumflex with right to left collaterals to small obtuse marginal moderate atherosclerosis of right coronary artery and severe ulcerated ostial left anterior descending artery disease.  Long discussion with cardiovascular team including cardiovascular surgery with the best long-term treatment options including coronary artery bypass surgery  Review of Systems: Positive for: None Negative for: Vision change, hearing change, syncope, dizziness, nausea, vomiting,diarrhea, bloody stool, stomach pain, cough, congestion, diaphoresis, urinary frequency, urinary pain,skin lesions, skin rashes Others previously listed  Objective: Telemetry: Normal sinus rhythm Physical Exam: Blood pressure 121/80, pulse 76, temperature 98.3 F (36.8 C), resp. rate 16, height 6\' 1"  (1.854 m), weight 93.9 kg, SpO2 95 %. Body mass index is 27.31 kg/m. General: Well developed, well nourished, in no acute distress. Head: Normocephalic, atraumatic, sclera non-icteric, no xanthomas, nares are without discharge. Neck: No apparent masses Lungs: Normal respirations with no wheezes, no rhonchi, no rales , no crackles   Heart: Regular rate and rhythm, normal S1 S2, no murmur, no rub, no gallop, PMI is normal size and placement, carotid upstroke normal without bruit, jugular venous pressure normal Abdomen: Soft, non-tender, non-distended with normoactive bowel sounds. No hepatosplenomegaly. Abdominal aorta is normal size without bruit Extremities: No edema, no clubbing, no cyanosis, no ulcers,  Peripheral: 2+ radial, 2+ femoral, 2+ dorsal pedal pulses Neuro: Alert and oriented. Moves all  extremities spontaneously. Psych:  Responds to questions appropriately with a normal affect.   Intake/Output Summary (Last 24 hours) at 05/18/2021 0745 Last data filed at 05/17/2021 1343 Gross per 24 hour  Intake 240 ml  Output --  Net 240 ml     Inpatient Medications:  . allopurinol  300 mg Oral Daily  . aspirin EC  81 mg Oral Daily  . insulin aspart  0-5 Units Subcutaneous QHS  . insulin aspart  0-9 Units Subcutaneous TID WC  . insulin detemir  14 Units Subcutaneous QHS  . lisinopril  10 mg Oral Daily  . metoprolol tartrate  12.5 mg Oral BID  . pantoprazole  80 mg Oral Q1200  . sodium chloride flush  3 mL Intravenous Q12H   Infusions:  . heparin 1,300 Units/hr (05/17/21 1208)  . nitroGLYCERIN Stopped (05/16/21 1120)    Labs: Recent Labs    05/17/21 0515 05/18/21 0554  NA 138 138  K 3.6 3.6  CL 106 105  CO2 27 25  GLUCOSE 181* 186*  BUN 15 18  CREATININE 0.91 1.06  CALCIUM 8.6* 8.7*  MG  --  1.9    No results for input(s): AST, ALT, ALKPHOS, BILITOT, PROT, ALBUMIN in the last 72 hours. Recent Labs    05/17/21 0515 05/18/21 0554  WBC 9.4 8.5  HGB 14.3 13.8  HCT 40.8 39.5  MCV 91.7 93.4  PLT 146* 155    No results for input(s): CKTOTAL, CKMB, TROPONINI in the last 72 hours. Invalid input(s): POCBNP No results for input(s): HGBA1C in the last 72 hours.    Weights: Filed Weights   05/14/21 1359 05/16/21 0725  Weight: 93 kg 93.9 kg     Radiology/Studies:  DG Chest 2 View  Result Date: 05/14/2021 CLINICAL DATA:  Chest pain EXAM: CHEST - 2 VIEW COMPARISON:  03/14/2021 FINDINGS: Heart and  mediastinal contours are within normal limits. No focal opacities or effusions. No acute bony abnormality. IMPRESSION: No active cardiopulmonary disease. Electronically Signed   By: Rolm Baptise M.D.   On: 05/14/2021 14:34   CARDIAC CATHETERIZATION  Result Date: 05/16/2021  Ost LAD to Prox LAD lesion is 75% stenosed.  Prox LAD to Mid LAD lesion is 85% stenosed with  80% stenosed side branch in 1st Sept.  Mid LAD-1 lesion is 60% stenosed.  Mid LAD-2 lesion is 40% stenosed.  Prox RCA lesion is 60% stenosed.  Mid RCA lesion is 40% stenosed.  Dist RCA lesion is 35% stenosed.  Prox Cx lesion is 100% stenosed.  Dist Cx lesion is 100% stenosed.  71-year-old male with hypertension hyperlipidemia and diabetes having acute non-ST elevation myocardial infarction with elevated troponin of 532 and substernal chest discomfort Left ventricle with hypokinesis of anteroapical wall and ejection fraction of 45% Diffuse moderate atherosclerosis of right coronary artery Occluded proximal circumflex artery with minimal obtuse marginal collateralization from right Significantly complicated ulcerated plaque of ostial LAD to proximal LAD with some mid LAD stenoses likely culprit Plan High intensity cholesterol therapy Discussion with cardiac team and cardiovascular surgical team for further treatment options including coronary artery bypass surgery Hypertension control and treatment with beta-blocker and ACE inhibitor for cardiovascular risk reduction after non-ST elevation myocardial infarction Cardiac rehabilitation   ECHOCARDIOGRAM COMPLETE  Result Date: 05/15/2021    ECHOCARDIOGRAM REPORT   Patient Name:   Ralph Marsh Date of Exam: 05/15/2021 Medical Rec #:  [id number]       Height:       73.0 in Accession #:    [id number]      Weight:       205.0 lb Date of Birth:  05-02-1946        BSA:          2.174 m Patient Age:    75 years        BP:           130/55 mmHg Patient Gender: M               HR:           88 bpm. Exam Location:  [facility] Procedure: 2D Echo Indications:   nstemi  History:       Patient has no prior history of Echocardiogram examinations.                Angina.  Sonographer:   Carl Best RN Referring      [phone] MATTHEW M ECKSTAT Phys: IMPRESSIONS  1. Left ventricular ejection fraction, by estimation, is 65 to 70%. The left ventricle has normal function. The left ventricle  has no regional wall motion abnormalities. Left ventricular diastolic parameters were normal.  2. Right ventricular systolic function is normal. The right ventricular size is normal.  3. The mitral valve is normal in structure. Trivial mitral valve regurgitation.  4. The aortic valve is normal in structure. Aortic valve regurgitation is trivial. FINDINGS  Left Ventricle: Left ventricular ejection fraction, by estimation, is 65 to 70%. The left ventricle has normal function. The left ventricle has no regional wall motion abnormalities. The left ventricular internal cavity size was normal in size. There is  no left ventricular hypertrophy. Left ventricular diastolic parameters were normal. Right Ventricle: The right ventricular size is normal. No increase in right ventricular wall thickness. Right ventricular systolic function is normal. Left Atrium: Left atrial size was normal in size. Right Atrium:  Right atrial size was normal in size. Pericardium: There is no evidence of pericardial effusion. Mitral Valve: The mitral valve is normal in structure. Trivial mitral valve regurgitation. Tricuspid Valve: The tricuspid valve is normal in structure. Tricuspid valve regurgitation is trivial. Aortic Valve: The aortic valve is normal in structure. Aortic valve regurgitation is trivial. Aortic valve peak gradient measures 3.7 mmHg. Pulmonic Valve: The pulmonic valve was normal in structure. Pulmonic valve regurgitation is not visualized. Aorta: The aortic root and ascending aorta are structurally normal, with no evidence of dilitation. IAS/Shunts: No atrial level shunt detected by color flow Doppler.  LEFT VENTRICLE PLAX 2D LVIDd:         3.87 cm  Diastology LVIDs:         2.75 cm  LV e' medial:    5.44 cm/s LV PW:         0.98 cm  LV E/e' medial:  10.0 LV IVS:        0.96 cm  LV e' lateral:   6.74 cm/s LVOT diam:     2.20 cm  LV E/e' lateral: 8.1 LV SV:         50 LV SV Index:   23 LVOT Area:     3.80 cm  AORTIC VALVE AV  Area (Vmax): 2.80 cm AV Vmax:        96.40 cm/s AV Peak Grad:   3.7 mmHg LVOT Vmax:      71.10 cm/s LVOT Vmean:     47.100 cm/s LVOT VTI:       0.132 m  AORTA Ao Root diam: 3.40 cm MITRAL VALVE MV Area (PHT): 4.96 cm    SHUNTS MV Decel Time: 153 msec    Systemic VTI:  0.13 m MV E velocity: 54.40 cm/s  Systemic Diam: 2.20 cm MV A velocity: 89.10 cm/s MV E/A ratio:  0.61 Serafina Royals MD Electronically signed by Serafina Royals MD Signature Date/Time: 05/15/2021/12:38:57 PM    Final    CT Angio Chest/Abd/Pel for Dissection W and/or Wo Contrast  Result Date: 05/14/2021 CLINICAL DATA:  Chest pain radiating into arms and back, shortness of breath, nausea, diaphoresis EXAM: CT ANGIOGRAPHY CHEST, ABDOMEN AND PELVIS TECHNIQUE: Non-contrast CT of the chest was initially obtained. Multidetector CT imaging through the chest, abdomen and pelvis was performed using the standard protocol during bolus administration of intravenous contrast. Multiplanar reconstructed images and MIPs were obtained and reviewed to evaluate the vascular anatomy. CONTRAST:  162mL OMNIPAQUE IOHEXOL 350 MG/ML SOLN COMPARISON:  CT abdomen pelvis, 03/14/2021 FINDINGS: CTA CHEST FINDINGS Cardiovascular: Preferential opacification of the thoracic aorta. Normal contour and caliber of the thoracic aorta. No evidence of aneurysm, dissection, or other acute aortic pathology. Mild mixed calcific atherosclerosis. Normal heart size. Three-vessel coronary artery calcifications. No pericardial effusion. Mediastinum/Nodes: No enlarged mediastinal, hilar, or axillary lymph nodes. Thyroid gland, trachea, and esophagus demonstrate no significant findings. Lungs/Pleura: Lungs are clear. No pleural effusion or pneumothorax. Musculoskeletal: No chest wall abnormality. No acute or significant osseous findings. Review of the MIP images confirms the above findings. CTA ABDOMEN AND PELVIS FINDINGS VASCULAR Normal contour and caliber of the abdominal aorta. No evidence of  aneurysm, dissection, or other acute aortic pathology. Moderate mixed calcific atherosclerosis. Standard branching pattern of the abdominal aorta with solitary bilateral renal arteries. Review of the MIP images confirms the above findings. NON-VASCULAR Hepatobiliary: No solid liver abnormality is seen. Multiple gallstones in the gallbladder. No gallbladder wall thickening, or biliary dilatation. Pancreas: Unremarkable. No pancreatic ductal dilatation  or surrounding inflammatory changes. Spleen: Normal in size without significant abnormality. Adrenals/Urinary Tract: Adrenal glands are unremarkable. Kidneys are normal, without renal calculi, solid lesion, or hydronephrosis. Bladder is unremarkable. Stomach/Bowel: Stomach is within normal limits. Appendix appears normal. No evidence of bowel wall thickening, distention, or inflammatory changes. 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 Lymphatic: No enlarged abdominal or pelvic lymph nodes. Reproductive: No mass or other significant abnormality. Other: No abdominal wall hernia or abnormality. No abdominopelvic ascites. Musculoskeletal: No acute or significant osseous findings. Review of the MIP images confirms the above findings. IMPRESSION: 1. Normal contour and caliber of the thoracic and abdominal aorta. No evidence of aneurysm, dissection, or other acute aortic pathology. Mild to moderate mixed calcific atherosclerosis. 2. Coronary artery disease. 3. Cholelithiasis. 4. 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 No evidence of acute diverticulitis. Electronically Signed   By: Eddie Candle M.D.   On: 05/14/2021 16:55     Assessment and Recommendation  75 y.o. male with known diabetes hypertension hyperlipidemia and acute non-ST elevation myocardial infarction with overall good ejection fraction although slightly hypokinesis of the anterior apical wall and severe ulcerated ostial left anterior descending artery stenosis 1.  Continuation of heparin  for further risk reduction of myocardial infarction and ulcerated ostial LAD stenosis 2.  Continue current medical regimen including metoprolol lisinopril for myocardial infarction 3.  High intensity cholesterol therapy 4.  Transfer to [facility] for coronary artery bypass surgery with accepting physician Dr. Desiree Lucy occurring when bed is available which appears to be today. EMTALA has been completed this am  Signed, Serafina Royals M.D. FACC

## 2021-05-18 NOTE — Discharge Summary (Addendum)
Physician Discharge Summary  Patient ID: Ralph Marsh MRN: [id number] DOB/AGE: Dec 29, 1945 75 y.o.  Admit date: 05/14/2021 Discharge date: 05/18/2021  Admission Diagnoses:  Discharge Diagnoses:  Active Problems:   Obstructive uropathy   Acute deep vein thrombosis (DVT) of femoral vein of left lower extremity (HCC)   Essential hypertension   Hyperlipidemia   NSTEMI (non-ST elevated myocardial infarction) ([place])   Type 2 diabetes mellitus without complications ([place])   Discharged Condition: good  Hospital Course:  75 year old male with a prior DVT, HTN, HLD T2DM, left nephrolithiasis presented with chest pain in the central chest mostly constant, onset since 7/8 afternoon after a meal. Patient was seen in the ED with mild hypertension intermittent tachypnea and hypoxemia requiring 2 L nasal cannula lab with elevated troponin COVID-19 negative CT angio chest abdomen pelvis no evidence of PE, showed cholelithiasis, CAD.  [facility] cardiology was contacted patient was placed on heparin drip for ACS protocol and admitted. Patient had no relief with nitroglycerin but had improvement from morphine. Patient is placed on IV heparin and nitroglycerine drip. He also had heart cath, showed multivessel diseases, he is accepted to [facility] for CABG.  Consults: cardiology  Significant Diagnostic Studies:  Ost LAD to Prox LAD lesion is 75% stenosed. Prox LAD to Mid LAD lesion is 85% stenosed with 80% stenosed side branch in 1st Sept. Mid LAD-1 lesion is 60% stenosed. Mid LAD-2 lesion is 40% stenosed. Prox RCA lesion is 60% stenosed. Mid RCA lesion is 40% stenosed. Dist RCA lesion is 35% stenosed. Prox Cx lesion is 100% stenosed. Dist Cx lesion is 100% stenosed.   74-year-old male with hypertension hyperlipidemia and diabetes having acute non-ST elevation myocardial infarction with elevated troponin of 532 and substernal chest discomfort   Left ventricle with hypokinesis of anteroapical wall and  ejection fraction of 45%   Diffuse moderate atherosclerosis of right coronary artery Occluded proximal circumflex artery with minimal obtuse marginal collateralization from right Significantly complicated ulcerated plaque of ostial LAD to proximal LAD with some mid LAD stenoses likely culprit   Plan High intensity cholesterol therapy Discussion with cardiac team and cardiovascular surgical team for further treatment options including coronary artery bypass surgery Hypertension control and treatment with beta-blocker and ACE inhibitor for cardiovascular risk reduction after non-ST elevation myocardial infarction Cardiac rehabilitation   CT ANGIOGRAPHY CHEST, ABDOMEN AND PELVIS   TECHNIQUE: Non-contrast CT of the chest was initially obtained.   Multidetector CT imaging through the chest, abdomen and pelvis was performed using the standard protocol during bolus administration of intravenous contrast. Multiplanar reconstructed images and MIPs were obtained and reviewed to evaluate the vascular anatomy.   CONTRAST:  115mL OMNIPAQUE IOHEXOL 350 MG/ML SOLN   COMPARISON:  CT abdomen pelvis, 03/14/2021   FINDINGS: CTA CHEST FINDINGS   Cardiovascular: Preferential opacification of the thoracic aorta. Normal contour and caliber of the thoracic aorta. No evidence of aneurysm, dissection, or other acute aortic pathology. Mild mixed calcific atherosclerosis. Normal heart size. Three-vessel coronary artery calcifications. No pericardial effusion.   Mediastinum/Nodes: No enlarged mediastinal, hilar, or axillary lymph nodes. Thyroid gland, trachea, and esophagus demonstrate no significant findings.   Lungs/Pleura: Lungs are clear. No pleural effusion or pneumothorax.   Musculoskeletal: No chest wall abnormality. No acute or significant osseous findings.   Review of the MIP images confirms the above findings.   CTA ABDOMEN AND PELVIS FINDINGS   VASCULAR   Normal contour and caliber  of the abdominal aorta. No evidence of aneurysm, dissection, or other acute  aortic pathology. Moderate mixed calcific atherosclerosis. Standard branching pattern of the abdominal aorta with solitary bilateral renal arteries.   Review of the MIP images confirms the above findings.   NON-VASCULAR   Hepatobiliary: No solid liver abnormality is seen. Multiple gallstones in the gallbladder. No gallbladder wall thickening, or biliary dilatation.   Pancreas: Unremarkable. No pancreatic ductal dilatation or surrounding inflammatory changes.   Spleen: Normal in size without significant abnormality.   Adrenals/Urinary Tract: Adrenal glands are unremarkable. Kidneys are normal, without renal calculi, solid lesion, or hydronephrosis. Bladder is unremarkable.   Stomach/Bowel: Stomach is within normal limits. Appendix appears normal. No evidence of bowel wall thickening, distention, or inflammatory changes. 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   Lymphatic: No enlarged abdominal or pelvic lymph nodes.   Reproductive: No mass or other significant abnormality.   Other: No abdominal wall hernia or abnormality. No abdominopelvic ascites.   Musculoskeletal: No acute or significant osseous findings.   Review of the MIP images confirms the above findings.   IMPRESSION: 1. Normal contour and caliber of the thoracic and abdominal aorta. No evidence of aneurysm, dissection, or other acute aortic pathology. Mild to moderate mixed calcific atherosclerosis. 2. Coronary artery disease. 3. Cholelithiasis. 4. 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 No evidence of acute diverticulitis.     Electronically Signed   By: Eddie Candle M.D.   On: 05/14/2021 16:55  Treatments: heart cath, iv heparin  Discharge Exam: Blood pressure 121/80, pulse 76, temperature 98.3 F (36.8 C), resp. rate 16, height 6\' 1"  (1.854 m), weight 93.9 kg, SpO2 95 %. General appearance: alert and  cooperative Resp: clear to auscultation bilaterally Cardio: regular rate and rhythm, S1, S2 normal, no murmur, click, rub or gallop GI: soft, non-tender; bowel sounds normal; no masses,  no organomegaly Extremities: extremities normal, atraumatic, no cyanosis or edema  Disposition: Discharge disposition: 95-DC/txfr to another health care institution with planned acute care hosp IP readmit       Discharge Instructions     AMB Referral to Cardiac Rehabilitation - Phase II   Complete by: As directed    Diagnosis: NSTEMI   Diet - low sodium heart healthy   Complete by: As directed    Increase activity slowly   Complete by: As directed       Allergies as of 05/18/2021       Reactions   Augmentin [amoxicillin-pot Clavulanate] Anaphylaxis   Latex Other (See Comments)   Nasal inflammation   Other Anaphylaxis   Solifenacin Other (See Comments)   Sulfa Antibiotics Anaphylaxis   Rosuvastatin    Other reaction(s): Muscle pain   Atorvastatin    Other reaction(s): Muscle Pain, Pain in lower limb   Omeprazole    Other reaction(s): Heartburn   Pravastatin    Other reaction(s): Muscle pain   Simvastatin Other (See Comments)   Other reaction(s): Fatigue, Muscle pain        Medication List     STOP taking these medications    diclofenac sodium 1 % Gel Commonly known as: VOLTAREN   FISH OIL + D3 PO   HAWTHORN EXTRACT PO   Juice Plus Fibre Liqd   metFORMIN 500 MG 24 hr tablet Commonly known as: GLUCOPHAGE-XR   metFORMIN 500 MG tablet Commonly known as: Glucophage   multivitamin tablet   ondansetron 4 MG disintegrating tablet Commonly known as: Zofran ODT   SYNVISC IX   tamsulosin 0.4 MG Caps capsule Commonly known as: FLOMAX  TAKE these medications    allopurinol 300 MG tablet Commonly known as: ZYLOPRIM Take 1 tablet by mouth daily.   aspirin EC 81 MG tablet Take 81 mg by mouth daily.   Cholecalciferol 25 MCG (1000 UT) tablet Take 1,000 Units  by mouth 2 (two) times daily.   esomeprazole 40 MG capsule Commonly known as: NEXIUM Take 1 capsule by mouth daily.   heparin 25000 UT/250ML infusion Inject 1,300 Units/hr into the vein continuous.   insulin detemir 100 UNIT/ML injection Commonly known as: LEVEMIR Inject 0.14 mLs (14 Units total) into the skin at bedtime.   ketorolac 10 MG tablet Commonly known as: TORADOL Take 1 tablet (10 mg total) by mouth every 6 (six) hours as needed for moderate pain or severe pain.   lisinopril 20 MG tablet Commonly known as: ZESTRIL Take 10 mg by mouth daily.   metoprolol tartrate 25 MG tablet Commonly known as: LOPRESSOR Take 0.5 tablets (12.5 mg total) by mouth 2 (two) times daily.   morphine 15 MG tablet Commonly known as: MSIR Take 15 mg by mouth 3 (three) times daily as needed.   morphine 30 MG 12 hr tablet Commonly known as: MS CONTIN Take 30 mg by mouth 2 (two) times daily.   nitroGLYCERIN 0.4 MG SL tablet Commonly known as: NITROSTAT Place 1 tablet (0.4 mg total) under the tongue every 5 (five) minutes x 3 doses as needed for chest pain.        Follow-up Information     Corey Skains, MD Follow up in 1 week(s).   Specialty: Cardiology Contact information: [facility] [place] [address] [phone]                 Signed: Sharen Hones 05/18/2021, 9:42 AM

## 2021-05-18 NOTE — Progress Notes (Signed)
Inpatient Diabetes Program Recommendations  AACE/ADA: New Consensus Statement on Inpatient Glycemic Control (2015)  Target Ranges:  Prepandial:   less than 140 mg/dL      Peak postprandial:   less than 180 mg/dL (1-2 hours)      Critically ill patients:  140 - 180 mg/dL   Results for EVERT, WENRICH (MRN [id number]) as of 05/18/2021 08:24  Ref. Range 05/17/2021 07:33 05/17/2021 11:35 05/17/2021 16:26 05/17/2021 21:00  Glucose-Capillary Latest Ref Range: 70 - 99 mg/dL 175 (H)  2 units NOVOLOG  255 (H)  5 units NOVOLOG  263 (H)  5 units NOVOLOG  209 (H)  2 units NOVOLOG  10 units LEVEMIR   Results for CARTIER, WASHKO (MRN [id number]) as of 05/18/2021 08:24  Ref. Range 05/18/2021 07:38  Glucose-Capillary Latest Ref Range: 70 - 99 mg/dL 203 (H)  3 units NOVOLOG      Home DM Meds: Metformin 1000 mg QAM/ 500 mg QPM  Current Orders: Levemir 14 units QHS     Novolog Sensitive Correction Scale/ SSI (0-9 units) TID AC + HS     MD- Note Levemir dose increased for tonight  Afternoon CBGs are also elevated  Metformin remains on hold  Please consider adding Novolog Meal Coverage: Novolog 4 units TID with meals Hold if pt eats <50% of meal, Hold if pt NPO    --Will follow patient during hospitalization--  Wyn Quaker RN, MSN, CDE Diabetes Coordinator Inpatient Glycemic Control Team Team Pager: [phone] (8a-5p)

## 2021-05-19 DIAGNOSIS — I5031 Acute diastolic (congestive) heart failure: Secondary | ICD-10-CM | POA: Insufficient documentation

## 2021-05-20 DIAGNOSIS — Z951 Presence of aortocoronary bypass graft: Secondary | ICD-10-CM | POA: Insufficient documentation

## 2021-06-29 ENCOUNTER — Encounter: Payer: No Typology Code available for payment source | Attending: Family Medicine | Admitting: *Deleted

## 2021-06-29 ENCOUNTER — Other Ambulatory Visit: Payer: Self-pay

## 2021-06-29 DIAGNOSIS — Z951 Presence of aortocoronary bypass graft: Secondary | ICD-10-CM

## 2021-06-29 NOTE — Progress Notes (Signed)
Initial telephone orientation completed. Diagnosis can be found in [facility] 7/9. EP orientation scheduled Tuesday 9/6 at 8am.

## 2021-07-12 ENCOUNTER — Other Ambulatory Visit: Payer: Self-pay

## 2021-07-12 ENCOUNTER — Encounter: Payer: No Typology Code available for payment source | Attending: Family Medicine | Admitting: *Deleted

## 2021-07-12 VITALS — Ht 71.0 in | Wt 199.4 lb

## 2021-07-12 DIAGNOSIS — Z951 Presence of aortocoronary bypass graft: Secondary | ICD-10-CM | POA: Diagnosis not present

## 2021-07-12 NOTE — Progress Notes (Signed)
Cardiac Individual Treatment Plan  Patient Details  Name: Ralph Marsh MRN: [id number] Date of Birth: 05-02-1946 Referring Provider:   Flowsheet Row Cardiac Rehab from 07/12/2021 in [facility] Cardiac and Pulmonary Rehab  Referring Provider Brett Canales MD ([place])       Initial Encounter Date:  Flowsheet Row Cardiac Rehab from 07/12/2021 in [facility] Cardiac and Pulmonary Rehab  Date 07/12/21       Visit Diagnosis: S/P CABG x 2  Patient's Home Medications on Admission:  Current Outpatient Medications:    allopurinol (ZYLOPRIM) 300 MG tablet, Take 1 tablet by mouth daily. (Patient not taking: Reported on 06/29/2021), Disp: , Rfl:    aspirin EC 81 MG tablet, Take 81 mg by mouth daily., Disp: , Rfl:    Cholecalciferol 1000 UNITS tablet, Take 1,000 Units by mouth 2 (two) times daily., Disp: , Rfl:    esomeprazole (NEXIUM) 40 MG capsule, Take 1 capsule by mouth daily., Disp: , Rfl:    Eszopiclone 3 MG TABS, , Disp: , Rfl:    heparin 25000 UT/250ML infusion, Inject 1,300 Units/hr into the vein continuous. (Patient not taking: Reported on 06/29/2021), Disp: , Rfl:    insulin detemir (LEVEMIR) 100 UNIT/ML injection, Inject 0.14 mLs (14 Units total) into the skin at bedtime. (Patient not taking: Reported on 06/29/2021), Disp: 10 mL, Rfl: 11   ketorolac (TORADOL) 10 MG tablet, Take 1 tablet (10 mg total) by mouth every 6 (six) hours as needed for moderate pain or severe pain. (Patient not taking: Reported on 06/29/2021), Disp: 10 tablet, Rfl: 0   lisinopril (ZESTRIL) 20 MG tablet, Take 10 mg by mouth daily. (Patient not taking: Reported on 06/29/2021), Disp: , Rfl:    metFORMIN (GLUCOPHAGE) 500 MG tablet, Take by mouth., Disp: , Rfl:    metoprolol tartrate (LOPRESSOR) 25 MG tablet, Take 0.5 tablets (12.5 mg total) by mouth 2 (two) times daily., Disp: , Rfl:    morphine (MS CONTIN) 30 MG 12 hr tablet, Take 30 mg by mouth 2 (two) times daily. (Patient not taking: Reported on 06/29/2021), Disp: , Rfl:    morphine  (MSIR) 15 MG tablet, Take 15 mg by mouth 3 (three) times daily as needed. (Patient not taking: Reported on 06/29/2021), Disp: , Rfl:    nitroGLYCERIN (NITROSTAT) 0.4 MG SL tablet, Place 1 tablet (0.4 mg total) under the tongue every 5 (five) minutes x 3 doses as needed for chest pain. (Patient not taking: Reported on 06/29/2021), Disp: , Rfl: 12  Past Medical History: Past Medical History:  Diagnosis Date   Arthritis    BPH (benign prostatic hyperplasia)    Diabetes mellitus, type 2 ([place])    DVT (deep venous thrombosis) ([place]) 05/12/2017   left upper leg   Emphysema of lung ([facility])    mild - on xray   Fatty liver    GERD (gastroesophageal reflux disease)    Hypertension    Kidney stone     Tobacco Use: Social History   Tobacco Use  Smoking Status Former   Packs/day: 1.00   Years: 15.00   Pack years: 15.00   Types: Cigarettes   Quit date: 1995   Years since quitting: 27.6  Smokeless Tobacco Never    Labs: Recent Review Management consultant for [facility] Cardiac and Pulmonary Rehab Latest Ref Rng & Units 05/15/2021   Cholestrol 0 - 200 mg/dL 141   LDLCALC 0 - 99 mg/dL 68   HDL >40 mg/dL 34(L)   Trlycerides <150 mg/dL  196(H)   Hemoglobin A1c 4.8 - 5.6 % 8.2(H)        Exercise Target Goals: Exercise Program Goal: Individual exercise prescription set using results from initial 6 min walk test and THRR while considering  patient's activity barriers and safety.   Exercise Prescription Goal: Initial exercise prescription builds to 30-45 minutes a day of aerobic activity, 2-3 days per week.  Home exercise guidelines will be given to patient during program as part of exercise prescription that the participant will acknowledge.   Education: Aerobic Exercise: - Group verbal and visual presentation on the components of exercise prescription. Introduces F.I.T.T principle from ACSM for exercise prescriptions.  Reviews F.I.T.T. principles of aerobic exercise including progression.  Written material given at graduation. Flowsheet Row Cardiac Rehab from 07/12/2021 in [facility] Cardiac and Pulmonary Rehab  Education need identified 07/12/21       Education: Resistance Exercise: - Group verbal and visual presentation on the components of exercise prescription. Introduces F.I.T.T principle from ACSM for exercise prescriptions  Reviews F.I.T.T. principles of resistance exercise including progression. Written material given at graduation. Flowsheet Row Cardiac Rehab from 07/12/2021 in [facility] Cardiac and Pulmonary Rehab  Education need identified 07/12/21        Education: Exercise & Equipment Safety: - Individual verbal instruction and demonstration of equipment use and safety with use of the equipment. Flowsheet Row Cardiac Rehab from 07/12/2021 in [facility] Cardiac and Pulmonary Rehab  Date 07/12/21  Educator [facility]  Instruction Review Code 1- Verbalizes Understanding       Education: Exercise Physiology & General Exercise Guidelines: - Group verbal and written instruction with models to review the exercise physiology of the cardiovascular system and associated critical values. Provides general exercise guidelines with specific guidelines to those with heart or lung disease.  Flowsheet Row Cardiac Rehab from 07/12/2021 in [facility] Cardiac and Pulmonary Rehab  Education need identified 07/12/21       Education: Flexibility, Balance, Mind/Body Relaxation: - Group verbal and visual presentation with interactive activity on the components of exercise prescription. Introduces F.I.T.T principle from ACSM for exercise prescriptions. Reviews F.I.T.T. principles of flexibility and balance exercise training including progression. Also discusses the mind body connection.  Reviews various relaxation techniques to help reduce and manage stress (i.e. Deep breathing, progressive muscle relaxation, and visualization). Balance handout provided to take home. Written material given at graduation.   Activity  Barriers & Risk Stratification:  Activity Barriers & Cardiac Risk Stratification - 07/12/21 1014       Activity Barriers & Cardiac Risk Stratification   Activity Barriers Incisional Pain;Joint Problems;Deconditioning;Muscular Weakness;Shortness of Breath;Decreased Ventricular Function;Balance Concerns;Other (comment)    Comments Swelling/edema in R leg from vein harvest    Cardiac Risk Stratification High             6 Minute Walk:  6 Minute Walk     Row Name 07/12/21 1013         6 Minute Walk   Phase Initial     Distance 890 feet     Walk Time 6 minutes     # of Rest Breaks 0     MPH 1.67     METS 2.09     RPE 11     Perceived Dyspnea  1     VO2 Peak 7.32     Symptoms Yes (comment)     Comments leg pain from swelling (2/10), full inflation feels tight in chest     Resting HR 84 bpm  Resting BP 132/66     Resting Oxygen Saturation  94 %     Exercise Oxygen Saturation  during 6 min walk 96 %     Max Ex. HR 115 bpm     Max Ex. BP 136/74     2 Minute Post BP 112/64              Oxygen Initial Assessment:   Oxygen Re-Evaluation:   Oxygen Discharge (Final Oxygen Re-Evaluation):   Initial Exercise Prescription:  Initial Exercise Prescription - 07/12/21 1000       Date of Initial Exercise RX and Referring Provider   Date 07/12/21    Referring Provider Brett Canales MD ([place])      Treadmill   MPH 1.5    Grade 0.5    Minutes 15    METs 2.26      REL-XR   Level 1    Speed 50    Minutes 15    METs 1.5      T5 Nustep   Level 2    SPM 80    Minutes 15    METs 2      Track   Laps 23    Minutes 15    METs 2.25      Prescription Details   Frequency (times per week) 3    Duration Progress to 30 minutes of continuous aerobic without signs/symptoms of physical distress      Intensity   THRR 40-80% of Max Heartrate 108-131    Ratings of Perceived Exertion 11-13    Perceived Dyspnea 0-4      Progression   Progression Continue to progress  workloads to maintain intensity without signs/symptoms of physical distress.      Resistance Training   Training Prescription Yes    Weight 3 lb    Reps 10-15             Perform Capillary Blood Glucose checks as needed.  Exercise Prescription Changes:   Exercise Prescription Changes     Row Name 07/12/21 1000             Response to Exercise   Blood Pressure (Admit) 132/66       Blood Pressure (Exercise) 136/74       Blood Pressure (Exit) 112/64       Heart Rate (Admit) 84 bpm       Heart Rate (Exercise) 115 bpm       Heart Rate (Exit) 91 bpm       Oxygen Saturation (Admit) 94 %       Oxygen Saturation (Exercise) 96 %       Rating of Perceived Exertion (Exercise) 11       Perceived Dyspnea (Exercise) 1       Symptoms leg pain 2/10, SOB (tight breathing)       Comments walk test results                Exercise Comments:   Exercise Goals and Review:   Exercise Goals     Row Name 07/12/21 1017             Exercise Goals   Increase Physical Activity Yes       Intervention Provide advice, education, support and counseling about physical activity/exercise needs.;Develop an individualized exercise prescription for aerobic and resistive training based on initial evaluation findings, risk stratification, comorbidities and participant's personal goals.       Expected Outcomes Short Term: Attend rehab  on a regular basis to increase amount of physical activity.;Long Term: Add in home exercise to make exercise part of routine and to increase amount of physical activity.;Long Term: Exercising regularly at least 3-5 days a week.       Increase Strength and Stamina Yes       Intervention Provide advice, education, support and counseling about physical activity/exercise needs.;Develop an individualized exercise prescription for aerobic and resistive training based on initial evaluation findings, risk stratification, comorbidities and participant's personal goals.        Expected Outcomes Short Term: Perform resistance training exercises routinely during rehab and add in resistance training at home;Short Term: Increase workloads from initial exercise prescription for resistance, speed, and METs.;Long Term: Improve cardiorespiratory fitness, muscular endurance and strength as measured by increased METs and functional capacity (6MWT)       Able to understand and use rate of perceived exertion (RPE) scale Yes       Intervention Provide education and explanation on how to use RPE scale       Expected Outcomes Short Term: Able to use RPE daily in rehab to express subjective intensity level;Long Term:  Able to use RPE to guide intensity level when exercising independently       Able to understand and use Dyspnea scale Yes       Intervention Provide education and explanation on how to use Dyspnea scale       Expected Outcomes Short Term: Able to use Dyspnea scale daily in rehab to express subjective sense of shortness of breath during exertion;Long Term: Able to use Dyspnea scale to guide intensity level when exercising independently       Knowledge and understanding of Target Heart Rate Range (THRR) Yes       Intervention Provide education and explanation of THRR including how the numbers were predicted and where they are located for reference       Expected Outcomes Short Term: Able to state/look up THRR;Long Term: Able to use THRR to govern intensity when exercising independently;Short Term: Able to use daily as guideline for intensity in rehab       Able to check pulse independently Yes       Intervention Provide education and demonstration on how to check pulse in carotid and radial arteries.;Review the importance of being able to check your own pulse for safety during independent exercise       Expected Outcomes Short Term: Able to explain why pulse checking is important during independent exercise;Long Term: Able to check pulse independently and accurately        Understanding of Exercise Prescription Yes       Intervention Provide education, explanation, and written materials on patient's individual exercise prescription       Expected Outcomes Short Term: Able to explain program exercise prescription;Long Term: Able to explain home exercise prescription to exercise independently                Exercise Goals Re-Evaluation :   Discharge Exercise Prescription (Final Exercise Prescription Changes):  Exercise Prescription Changes - 07/12/21 1000       Response to Exercise   Blood Pressure (Admit) 132/66    Blood Pressure (Exercise) 136/74    Blood Pressure (Exit) 112/64    Heart Rate (Admit) 84 bpm    Heart Rate (Exercise) 115 bpm    Heart Rate (Exit) 91 bpm    Oxygen Saturation (Admit) 94 %    Oxygen Saturation (Exercise) 96 %  Rating of Perceived Exertion (Exercise) 11    Perceived Dyspnea (Exercise) 1    Symptoms leg pain 2/10, SOB (tight breathing)    Comments walk test results             Nutrition:  Target Goals: Understanding of nutrition guidelines, daily intake of sodium '1500mg'$ , cholesterol '200mg'$ , calories 30% from fat and 7% or less from saturated fats, daily to have 5 or more servings of fruits and vegetables.  Education: All About Nutrition: -Group instruction provided by verbal, written material, interactive activities, discussions, models, and posters to present general guidelines for heart healthy nutrition including fat, fiber, MyPlate, the role of sodium in heart healthy nutrition, utilization of the nutrition label, and utilization of this knowledge for meal planning. Follow up email sent as well. Written material given at graduation.   Biometrics:  Pre Biometrics - 07/12/21 1017       Pre Biometrics   Height '5\' 11"'$  (1.803 m)    Weight 199 lb 6.4 oz (90.4 kg)    BMI (Calculated) 27.82    Single Leg Stand 3.8 seconds              Nutrition Therapy Plan and Nutrition Goals:   Nutrition  Assessments:  MEDIFICTS Score Key: ?70 Need to make dietary changes  40-70 Heart Healthy Diet ? 40 Therapeutic Level Cholesterol Diet  Flowsheet Row Cardiac Rehab from 07/12/2021 in [facility] Cardiac and Pulmonary Rehab  Picture Your Plate Total Score on Admission 78      Picture Your Plate Scores: [id number] Unhealthy dietary pattern with much room for improvement. 41-50 Dietary pattern unlikely to meet recommendations for good health and room for improvement. 51-60 More healthful dietary pattern, with some room for improvement.  >60 Healthy dietary pattern, although there may be some specific behaviors that could be improved.    Nutrition Goals Re-Evaluation:   Nutrition Goals Discharge (Final Nutrition Goals Re-Evaluation):   Psychosocial: Target Goals: Acknowledge presence or absence of significant depression and/or stress, maximize coping skills, provide positive support system. Participant is able to verbalize types and ability to use techniques and skills needed for reducing stress and depression.   Education: Stress, Anxiety, and Depression - Group verbal and visual presentation to define topics covered.  Reviews how body is impacted by stress, anxiety, and depression.  Also discusses healthy ways to reduce stress and to treat/manage anxiety and depression.  Written material given at graduation.   Education: Sleep Hygiene -Provides group verbal and written instruction about how sleep can affect your health.  Define sleep hygiene, discuss sleep cycles and impact of sleep habits. Review good sleep hygiene tips.    Initial Review & Psychosocial Screening:  Initial Psych Review & Screening - 06/29/21 1411       Initial Review   Current issues with None Identified      Family Dynamics   Good Support System? Yes   wife   Concerns Inappropriate over/under dependence on family/friends      Barriers   Psychosocial barriers to participate in program There are no identifiable barriers  or psychosocial needs.;The patient should benefit from training in stress management and relaxation.      Screening Interventions   Interventions Encouraged to exercise;To provide support and resources with identified psychosocial needs;Provide feedback about the scores to participant    Expected Outcomes Short Term goal: Identification and review with participant of any Quality of Life or Depression concerns found by scoring the questionnaire.;Short Term goal: Utilizing psychosocial counselor, staff  and physician to assist with identification of specific Stressors or current issues interfering with healing process. Setting desired goal for each stressor or current issue identified.;Long Term Goal: Stressors or current issues are controlled or eliminated.;Long Term goal: The participant improves quality of Life and PHQ9 Scores as seen by post scores and/or verbalization of changes             Quality of Life Scores:   Quality of Life - 07/12/21 1020       Quality of Life   Select Quality of Life      Quality of Life Scores   Health/Function Pre 24.8 %    Socioeconomic Pre 30 %    Psych/Spiritual Pre 30 %    Family Pre 30 %    GLOBAL Pre 27.56 %            Scores of 19 and below usually indicate a poorer quality of life in these areas.  A difference of  2-3 points is a clinically meaningful difference.  A difference of 2-3 points in the total score of the Quality of Life Index has been associated with significant improvement in overall quality of life, self-image, physical symptoms, and general health in studies assessing change in quality of life.  PHQ-9: Recent Review Flowsheet Data     Depression screen [facility] 2/9 07/12/2021   Decreased Interest 2   Down, Depressed, Hopeless 2   PHQ - 2 Score 4   Altered sleeping 1   Tired, decreased energy 3   Change in appetite 2   Feeling bad or failure about yourself  2   Trouble concentrating 1   Moving slowly or fidgety/restless 2    Suicidal thoughts 0   PHQ-9 Score 15   Difficult doing work/chores Not difficult at all      Interpretation of Total Score  Total Score Depression Severity:  1-4 = Minimal depression, 5-9 = Mild depression, 10-14 = Moderate depression, 15-19 = Moderately severe depression, 20-27 = Severe depression   Psychosocial Evaluation and Intervention:  Psychosocial Evaluation - 06/29/21 1417       Psychosocial Evaluation & Interventions   Interventions Encouraged to exercise with the program and follow exercise prescription    Comments Mr. Bibb reports doing well post CABG x 2. He states in the first few weeks there was some anxiety and maybe even some depression related to his surgery and recovery, but he said the last two weeks he has been feeling a lot better. His wife, who is a Marine scientist, has been very supportive and helped keep him on track. He has been walking three days a week and reports feeling well after. They are looking forward to starting cardiac rehab for education and exercise guidelines    Expected Outcomes Short: attend cardiac rehab for education and exercise. Long: develop and maintain positive self care habits.    Continue Psychosocial Services  Follow up required by staff             Psychosocial Re-Evaluation:   Psychosocial Discharge (Final Psychosocial Re-Evaluation):   Vocational Rehabilitation: Provide vocational rehab assistance to qualifying candidates.   Vocational Rehab Evaluation & Intervention:  Vocational Rehab - 06/29/21 1407       Initial Vocational Rehab Evaluation & Intervention   Assessment shows need for Vocational Rehabilitation No             Education: Education Goals: Education classes will be provided on a variety of topics geared toward better understanding of  heart health and risk factor modification. Participant will state understanding/return demonstration of topics presented as noted by education test scores.  Learning  Barriers/Preferences:  Learning Barriers/Preferences - 06/29/21 1419       Learning Barriers/Preferences   Learning Barriers None    Learning Preferences None             General Cardiac Education Topics:  AED/CPR: - Group verbal and written instruction with the use of models to demonstrate the basic use of the AED with the basic ABC's of resuscitation.   Anatomy and Cardiac Procedures: - Group verbal and visual presentation and models provide information about basic cardiac anatomy and function. Reviews the testing methods done to diagnose heart disease and the outcomes of the test results. Describes the treatment choices: Medical Management, Angioplasty, or Coronary Bypass Surgery for treating various heart conditions including Myocardial Infarction, Angina, Valve Disease, and Cardiac Arrhythmias.  Written material given at graduation.   Medication Safety: - Group verbal and visual instruction to review commonly prescribed medications for heart and lung disease. Reviews the medication, class of the drug, and side effects. Includes the steps to properly store meds and maintain the prescription regimen.  Written material given at graduation.   Intimacy: - Group verbal instruction through game format to discuss how heart and lung disease can affect sexual intimacy. Written material given at graduation..   Know Your Numbers and Heart Failure: - Group verbal and visual instruction to discuss disease risk factors for cardiac and pulmonary disease and treatment options.  Reviews associated critical values for Overweight/Obesity, Hypertension, Cholesterol, and Diabetes.  Discusses basics of heart failure: signs/symptoms and treatments.  Introduces Heart Failure Zone chart for action plan for heart failure.  Written material given at graduation. Flowsheet Row Cardiac Rehab from 07/12/2021 in [facility] Cardiac and Pulmonary Rehab  Education need identified 07/12/21       Infection  Prevention: - Provides verbal and written material to individual with discussion of infection control including proper hand washing and proper equipment cleaning during exercise session. Flowsheet Row Cardiac Rehab from 07/12/2021 in [facility] Cardiac and Pulmonary Rehab  Date 07/12/21  Educator [facility]  Instruction Review Code 1- Verbalizes Understanding       Falls Prevention: - Provides verbal and written material to individual with discussion of falls prevention and safety. Flowsheet Row Cardiac Rehab from 07/12/2021 in [facility] Cardiac and Pulmonary Rehab  Date 07/12/21  Educator [facility]  Instruction Review Code 1- Verbalizes Understanding       Other: -Provides group and verbal instruction on various topics (see comments)   Knowledge Questionnaire Score:  Knowledge Questionnaire Score - 07/12/21 1020       Knowledge Questionnaire Score   Pre Score 21/26             Core Components/Risk Factors/Patient Goals at Admission:  Personal Goals and Risk Factors at Admission - 07/12/21 1021       Core Components/Risk Factors/Patient Goals on Admission    Weight Management Yes;Weight Loss    Intervention Weight Management: Develop a combined nutrition and exercise program designed to reach desired caloric intake, while maintaining appropriate intake of nutrient and fiber, sodium and fats, and appropriate energy expenditure required for the weight goal.;Weight Management: Provide education and appropriate resources to help participant work on and attain dietary goals.;Weight Management/Obesity: Establish reasonable short term and long term weight goals.    Admit Weight 199 lb 6.4 oz (90.4 kg)    Goal Weight: Short Term 194 lb (88 kg)  Goal Weight: Long Term 190 lb (86.2 kg)    Expected Outcomes Short Term: Continue to assess and modify interventions until short term weight is achieved;Long Term: Adherence to nutrition and physical activity/exercise program aimed toward attainment of established  weight goal;Weight Loss: Understanding of general recommendations for a balanced deficit meal plan, which promotes 1-2 lb weight loss per week and includes a negative energy balance of [phone] kcal/d;Understanding recommendations for meals to include 15-35% energy as protein, 25-35% energy from fat, 35-60% energy from carbohydrates, less than '200mg'$  of dietary cholesterol, 20-35 gm of total fiber daily;Understanding of distribution of calorie intake throughout the day with the consumption of 4-5 meals/snacks    Heart Failure Yes    Intervention Provide a combined exercise and nutrition program that is supplemented with education, support and counseling about heart failure. Directed toward relieving symptoms such as shortness of breath, decreased exercise tolerance, and extremity edema.    Expected Outcomes Improve functional capacity of life;Short term: Attendance in program 2-3 days a week with increased exercise capacity. Reported lower sodium intake. Reported increased fruit and vegetable intake. Reports medication compliance.;Short term: Daily weights obtained and reported for increase. Utilizing diuretic protocols set by physician.;Long term: Adoption of self-care skills and reduction of barriers for early signs and symptoms recognition and intervention leading to self-care maintenance.    Hypertension Yes    Intervention Provide education on lifestyle modifcations including regular physical activity/exercise, weight management, moderate sodium restriction and increased consumption of fresh fruit, vegetables, and low fat dairy, alcohol moderation, and smoking cessation.;Monitor prescription use compliance.    Expected Outcomes Short Term: Continued assessment and intervention until BP is < 140/80m HG in hypertensive participants. < 130/868mHG in hypertensive participants with diabetes, heart failure or chronic kidney disease.;Long Term: Maintenance of blood pressure at goal levels.    Lipids Yes     Intervention Provide education and support for participant on nutrition & aerobic/resistive exercise along with prescribed medications to achieve LDL '70mg'$ , HDL >'40mg'$ .    Expected Outcomes Short Term: Participant states understanding of desired cholesterol values and is compliant with medications prescribed. Participant is following exercise prescription and nutrition guidelines.;Long Term: Cholesterol controlled with medications as prescribed, with individualized exercise RX and with personalized nutrition plan. Value goals: LDL < '70mg'$ , HDL > 40 mg.             Education:Diabetes - Individual verbal and written instruction to review signs/symptoms of diabetes, desired ranges of glucose level fasting, after meals and with exercise. Acknowledge that pre and post exercise glucose checks will be done for 3 sessions at entry of program. [facility]  Date 06/29/21  [facility] 1- Verbalizes Understanding       Core Components/Risk Factors/Patient Goals Review:    Core Components/Risk Factors/Patient Goals at Discharge (Final Review):    ITP Comments:  ITP Comments     Row Name 06/29/21 1437 07/12/21 1013         ITP Comments Initial telephone orientation completed. [facility] EP orientation scheduled Tuesday 9/6 at 8am. Completed 6MWT and gym orientation. Initial ITP created and sent for review to Dr. MaEmily FilbertMedical Director.               Comments: Initial ITP

## 2021-07-12 NOTE — Patient Instructions (Signed)
Patient Instructions  Patient Details  Name: Ralph Marsh MRN: [id number] Date of Birth: Mar 22, 1946 Referring Provider:  Georgiana Shore., P*  Below are your personal goals for exercise, nutrition, and risk factors. Our goal is to help you stay on track towards obtaining and maintaining these goals. We will be discussing your progress on these goals with you throughout the program.  Initial Exercise Prescription:  Initial Exercise Prescription - 07/12/21 1000       Date of Initial Exercise RX and Referring Provider   Date 07/12/21    Referring Provider Brett Canales [place] ([place])      Treadmill   MPH 1.5    Grade 0.5    Minutes 15    METs 2.26      REL-XR   Level 1    Speed 50    Minutes 15    METs 1.5      T5 Nustep   Level 2    SPM 80    Minutes 15    METs 2      Track   Laps 23    Minutes 15    METs 2.25      Prescription Details   Frequency (times per week) 3    Duration Progress to 30 minutes of continuous aerobic without signs/symptoms of physical distress      Intensity   THRR 40-80% of Max Heartrate 108-131    Ratings of Perceived Exertion 11-13    Perceived Dyspnea 0-4      Progression   Progression Continue to progress workloads to maintain intensity without signs/symptoms of physical distress.      Resistance Training   Training Prescription Yes    Weight 3 lb    Reps 10-15             Exercise Goals: Frequency: Be able to perform aerobic exercise two to three times per week in program working toward 2-5 days per week of home exercise.  Intensity: Work with a perceived exertion of 11 (fairly light) - 15 (hard) while following your exercise prescription.  We will make changes to your prescription with you as you progress through the program.   Duration: Be able to do 30 to 45 minutes of continuous aerobic exercise in addition to a 5 minute warm-up and a 5 minute cool-down routine.   Nutrition Goals: Your personal nutrition goals will be  established when you do your nutrition analysis with the dietician.  The following are general nutrition guidelines to follow: Cholesterol < '200mg'$ /day Sodium < '1500mg'$ /day Fiber: Men over 50 yrs - 30 grams per day  Personal Goals:  Personal Goals and Risk Factors at Admission - 07/12/21 1021       Core Components/Risk Factors/Patient Goals on Admission    Weight Management Yes;Weight Loss    Intervention Weight Management: Develop a combined nutrition and exercise program designed to reach desired caloric intake, while maintaining appropriate intake of nutrient and fiber, sodium and fats, and appropriate energy expenditure required for the weight goal.;Weight Management: Provide education and appropriate resources to help participant work on and attain dietary goals.;Weight Management/Obesity: Establish reasonable short term and long term weight goals.    Admit Weight 199 lb 6.4 oz (90.4 kg)    Goal Weight: Short Term 194 lb (88 kg)    Goal Weight: Long Term 190 lb (86.2 kg)    Expected Outcomes Short Term: Continue to assess and modify interventions until short term weight is achieved;Long Term: Adherence to  nutrition and physical activity/exercise program aimed toward attainment of established weight goal;Weight Loss: Understanding of general recommendations for a balanced deficit meal plan, which promotes 1-2 lb weight loss per week and includes a negative energy balance of [phone] kcal/d;Understanding recommendations for meals to include 15-35% energy as protein, 25-35% energy from fat, 35-60% energy from carbohydrates, less than '200mg'$  of dietary cholesterol, 20-35 gm of total fiber daily;Understanding of distribution of calorie intake throughout the day with the consumption of 4-5 meals/snacks    Heart Failure Yes    Intervention Provide a combined exercise and nutrition program that is supplemented with education, support and counseling about heart failure. Directed toward relieving symptoms  such as shortness of breath, decreased exercise tolerance, and extremity edema.    Expected Outcomes Improve functional capacity of life;Short term: Attendance in program 2-3 days a week with increased exercise capacity. Reported lower sodium intake. Reported increased fruit and vegetable intake. Reports medication compliance.;Short term: Daily weights obtained and reported for increase. Utilizing diuretic protocols set by physician.;Long term: Adoption of self-care skills and reduction of barriers for early signs and symptoms recognition and intervention leading to self-care maintenance.    Hypertension Yes    Intervention Provide education on lifestyle modifcations including regular physical activity/exercise, weight management, moderate sodium restriction and increased consumption of fresh fruit, vegetables, and low fat dairy, alcohol moderation, and smoking cessation.;Monitor prescription use compliance.    Expected Outcomes Short Term: Continued assessment and intervention until BP is < 140/78m HG in hypertensive participants. < 130/861mHG in hypertensive participants with diabetes, heart failure or chronic kidney disease.;Long Term: Maintenance of blood pressure at goal levels.    Lipids Yes    Intervention Provide education and support for participant on nutrition & aerobic/resistive exercise along with prescribed medications to achieve LDL '70mg'$ , HDL >'40mg'$ .    Expected Outcomes Short Term: Participant states understanding of desired cholesterol values and is compliant with medications prescribed. Participant is following exercise prescription and nutrition guidelines.;Long Term: Cholesterol controlled with medications as prescribed, with individualized exercise RX and with personalized nutrition plan. Value goals: LDL < '70mg'$ , HDL > 40 mg.             Tobacco Use Initial Evaluation: Social History   Tobacco Use  Smoking Status Former   Packs/day: 1.00   Years: 15.00   Pack years: 15.00    Types: Cigarettes   Quit date: 1995   Years since quitting: 27.6  Smokeless Tobacco Never    Exercise Goals and Review:  Exercise Goals     Row Name 07/12/21 1017             Exercise Goals   Increase Physical Activity Yes       Intervention Provide advice, education, support and counseling about physical activity/exercise needs.;Develop an individualized exercise prescription for aerobic and resistive training based on initial evaluation findings, risk stratification, comorbidities and participant's personal goals.       Expected Outcomes Short Term: Attend rehab on a regular basis to increase amount of physical activity.;Long Term: Add in home exercise to make exercise part of routine and to increase amount of physical activity.;Long Term: Exercising regularly at least 3-5 days a week.       Increase Strength and Stamina Yes       Intervention Provide advice, education, support and counseling about physical activity/exercise needs.;Develop an individualized exercise prescription for aerobic and resistive training based on initial evaluation findings, risk stratification, comorbidities and participant's personal goals.  Expected Outcomes Short Term: Perform resistance training exercises routinely during rehab and add in resistance training at home;Short Term: Increase workloads from initial exercise prescription for resistance, speed, and METs.;Long Term: Improve cardiorespiratory fitness, muscular endurance and strength as measured by increased METs and functional capacity (6MWT)       Able to understand and use rate of perceived exertion (RPE) scale Yes       Intervention Provide education and explanation on how to use RPE scale       Expected Outcomes Short Term: Able to use RPE daily in rehab to express subjective intensity level;Long Term:  Able to use RPE to guide intensity level when exercising independently       Able to understand and use Dyspnea scale Yes        Intervention Provide education and explanation on how to use Dyspnea scale       Expected Outcomes Short Term: Able to use Dyspnea scale daily in rehab to express subjective sense of shortness of breath during exertion;Long Term: Able to use Dyspnea scale to guide intensity level when exercising independently       Knowledge and understanding of Target Heart Rate Range (THRR) Yes       Intervention Provide education and explanation of THRR including how the numbers were predicted and where they are located for reference       Expected Outcomes Short Term: Able to state/look up THRR;Long Term: Able to use THRR to govern intensity when exercising independently;Short Term: Able to use daily as guideline for intensity in rehab       Able to check pulse independently Yes       Intervention Provide education and demonstration on how to check pulse in carotid and radial arteries.;Review the importance of being able to check your own pulse for safety during independent exercise       Expected Outcomes Short Term: Able to explain why pulse checking is important during independent exercise;Long Term: Able to check pulse independently and accurately       Understanding of Exercise Prescription Yes       Intervention Provide education, explanation, and written materials on patient's individual exercise prescription       Expected Outcomes Short Term: Able to explain program exercise prescription;Long Term: Able to explain home exercise prescription to exercise independently                Copy of goals given to participant.

## 2021-07-13 ENCOUNTER — Encounter: Payer: Self-pay | Admitting: *Deleted

## 2021-07-13 DIAGNOSIS — Z951 Presence of aortocoronary bypass graft: Secondary | ICD-10-CM

## 2021-07-13 NOTE — Progress Notes (Signed)
Cardiac Individual Treatment Plan  Patient Details  Name: Ralph Marsh MRN: [id number] Date of Birth: Sep 05, 1946 Referring Provider:   Flowsheet Row Cardiac Rehab from 07/12/2021 in [facility] Cardiac and Pulmonary Rehab  Referring Provider Brett Canales MD ([place])       Initial Encounter Date:  Flowsheet Row Cardiac Rehab from 07/12/2021 in [facility] Cardiac and Pulmonary Rehab  Date 07/12/21       Visit Diagnosis: S/P CABG x 2  Patient's Home Medications on Admission:  Current Outpatient Medications:    allopurinol (ZYLOPRIM) 300 MG tablet, Take 1 tablet by mouth daily. (Patient not taking: Reported on 06/29/2021), Disp: , Rfl:    aspirin EC 81 MG tablet, Take 81 mg by mouth daily., Disp: , Rfl:    Cholecalciferol 1000 UNITS tablet, Take 1,000 Units by mouth 2 (two) times daily., Disp: , Rfl:    esomeprazole (NEXIUM) 40 MG capsule, Take 1 capsule by mouth daily., Disp: , Rfl:    Eszopiclone 3 MG TABS, , Disp: , Rfl:    heparin 25000 UT/250ML infusion, Inject 1,300 Units/hr into the vein continuous. (Patient not taking: Reported on 06/29/2021), Disp: , Rfl:    insulin detemir (LEVEMIR) 100 UNIT/ML injection, Inject 0.14 mLs (14 Units total) into the skin at bedtime. (Patient not taking: Reported on 06/29/2021), Disp: 10 mL, Rfl: 11   ketorolac (TORADOL) 10 MG tablet, Take 1 tablet (10 mg total) by mouth every 6 (six) hours as needed for moderate pain or severe pain. (Patient not taking: Reported on 06/29/2021), Disp: 10 tablet, Rfl: 0   lisinopril (ZESTRIL) 20 MG tablet, Take 10 mg by mouth daily. (Patient not taking: Reported on 06/29/2021), Disp: , Rfl:    metFORMIN (GLUCOPHAGE) 500 MG tablet, Take by mouth., Disp: , Rfl:    metoprolol tartrate (LOPRESSOR) 25 MG tablet, Take 0.5 tablets (12.5 mg total) by mouth 2 (two) times daily., Disp: , Rfl:    morphine (MS CONTIN) 30 MG 12 hr tablet, Take 30 mg by mouth 2 (two) times daily. (Patient not taking: Reported on 06/29/2021), Disp: , Rfl:    morphine  (MSIR) 15 MG tablet, Take 15 mg by mouth 3 (three) times daily as needed. (Patient not taking: Reported on 06/29/2021), Disp: , Rfl:    nitroGLYCERIN (NITROSTAT) 0.4 MG SL tablet, Place 1 tablet (0.4 mg total) under the tongue every 5 (five) minutes x 3 doses as needed for chest pain. (Patient not taking: Reported on 06/29/2021), Disp: , Rfl: 12  Past Medical History: Past Medical History:  Diagnosis Date   Arthritis    BPH (benign prostatic hyperplasia)    Diabetes mellitus, type 2 ([place])    DVT (deep venous thrombosis) ([place]) 05/12/2017   left upper leg   Emphysema of lung ([facility])    mild - on xray   Fatty liver    GERD (gastroesophageal reflux disease)    Hypertension    Kidney stone     Tobacco Use: Social History   Tobacco Use  Smoking Status Former   Packs/day: 1.00   Years: 15.00   Pack years: 15.00   Types: Cigarettes   Quit date: 1995   Years since quitting: 27.7  Smokeless Tobacco Never    Labs: Recent Review Management consultant for [facility] Cardiac and Pulmonary Rehab Latest Ref Rng & Units 05/15/2021   Cholestrol 0 - 200 mg/dL 141   LDLCALC 0 - 99 mg/dL 68   HDL >40 mg/dL 34(L)   Trlycerides <150 mg/dL  196(H)   Hemoglobin A1c 4.8 - 5.6 % 8.2(H)        Exercise Target Goals: Exercise Program Goal: Individual exercise prescription set using results from initial 6 min walk test and THRR while considering  patient's activity barriers and safety.   Exercise Prescription Goal: Initial exercise prescription builds to 30-45 minutes a day of aerobic activity, 2-3 days per week.  Home exercise guidelines will be given to patient during program as part of exercise prescription that the participant will acknowledge.   Education: Aerobic Exercise: - Group verbal and visual presentation on the components of exercise prescription. Introduces F.I.T.T principle from ACSM for exercise prescriptions.  Reviews F.I.T.T. principles of aerobic exercise including progression.  Written material given at graduation. Flowsheet Row Cardiac Rehab from 07/12/2021 in [facility] Cardiac and Pulmonary Rehab  Education need identified 07/12/21       Education: Resistance Exercise: - Group verbal and visual presentation on the components of exercise prescription. Introduces F.I.T.T principle from ACSM for exercise prescriptions  Reviews F.I.T.T. principles of resistance exercise including progression. Written material given at graduation. Flowsheet Row Cardiac Rehab from 07/12/2021 in [facility] Cardiac and Pulmonary Rehab  Education need identified 07/12/21        Education: Exercise & Equipment Safety: - Individual verbal instruction and demonstration of equipment use and safety with use of the equipment. Flowsheet Row Cardiac Rehab from 07/12/2021 in [facility] Cardiac and Pulmonary Rehab  Date 07/12/21  Educator [facility]  Instruction Review Code 1- Verbalizes Understanding       Education: Exercise Physiology & General Exercise Guidelines: - Group verbal and written instruction with models to review the exercise physiology of the cardiovascular system and associated critical values. Provides general exercise guidelines with specific guidelines to those with heart or lung disease.  Flowsheet Row Cardiac Rehab from 07/12/2021 in [facility] Cardiac and Pulmonary Rehab  Education need identified 07/12/21       Education: Flexibility, Balance, Mind/Body Relaxation: - Group verbal and visual presentation with interactive activity on the components of exercise prescription. Introduces F.I.T.T principle from ACSM for exercise prescriptions. Reviews F.I.T.T. principles of flexibility and balance exercise training including progression. Also discusses the mind body connection.  Reviews various relaxation techniques to help reduce and manage stress (i.e. Deep breathing, progressive muscle relaxation, and visualization). Balance handout provided to take home. Written material given at graduation.   Activity  Barriers & Risk Stratification:  Activity Barriers & Cardiac Risk Stratification - 07/12/21 1014       Activity Barriers & Cardiac Risk Stratification   Activity Barriers Incisional Pain;Joint Problems;Deconditioning;Muscular Weakness;Shortness of Breath;Decreased Ventricular Function;Balance Concerns;Other (comment)    Comments Swelling/edema in R leg from vein harvest    Cardiac Risk Stratification High             6 Minute Walk:  6 Minute Walk     Row Name 07/12/21 1013         6 Minute Walk   Phase Initial     Distance 890 feet     Walk Time 6 minutes     # of Rest Breaks 0     MPH 1.67     METS 2.09     RPE 11     Perceived Dyspnea  1     VO2 Peak 7.32     Symptoms Yes (comment)     Comments leg pain from swelling (2/10), full inflation feels tight in chest     Resting HR 84 bpm  Resting BP 132/66     Resting Oxygen Saturation  94 %     Exercise Oxygen Saturation  during 6 min walk 96 %     Max Ex. HR 115 bpm     Max Ex. BP 136/74     2 Minute Post BP 112/64              Oxygen Initial Assessment:   Oxygen Re-Evaluation:   Oxygen Discharge (Final Oxygen Re-Evaluation):   Initial Exercise Prescription:  Initial Exercise Prescription - 07/12/21 1000       Date of Initial Exercise RX and Referring Provider   Date 07/12/21    Referring Provider Brett Canales MD ([facility])      Treadmill   MPH 1.5    Grade 0.5    Minutes 15    METs 2.26      REL-XR   Level 1    Speed 50    Minutes 15    METs 1.5      T5 Nustep   Level 2    SPM 80    Minutes 15    METs 2      Track   Laps 23    Minutes 15    METs 2.25      Prescription Details   Frequency (times per week) 3    Duration Progress to 30 minutes of continuous aerobic without signs/symptoms of physical distress      Intensity   THRR 40-80% of Max Heartrate 108-131    Ratings of Perceived Exertion 11-13    Perceived Dyspnea 0-4      Progression   Progression Continue to progress  workloads to maintain intensity without signs/symptoms of physical distress.      Resistance Training   Training Prescription Yes    Weight 3 lb    Reps 10-15             Perform Capillary Blood Glucose checks as needed.  Exercise Prescription Changes:   Exercise Prescription Changes     Row Name 07/12/21 1000             Response to Exercise   Blood Pressure (Admit) 132/66       Blood Pressure (Exercise) 136/74       Blood Pressure (Exit) 112/64       Heart Rate (Admit) 84 bpm       Heart Rate (Exercise) 115 bpm       Heart Rate (Exit) 91 bpm       Oxygen Saturation (Admit) 94 %       Oxygen Saturation (Exercise) 96 %       Rating of Perceived Exertion (Exercise) 11       Perceived Dyspnea (Exercise) 1       Symptoms leg pain 2/10, SOB (tight breathing)       Comments walk test results                Exercise Comments:   Exercise Goals and Review:   Exercise Goals     Row Name 07/12/21 1017             Exercise Goals   Increase Physical Activity Yes       Intervention Provide advice, education, support and counseling about physical activity/exercise needs.;Develop an individualized exercise prescription for aerobic and resistive training based on initial evaluation findings, risk stratification, comorbidities and participant's personal goals.       Expected Outcomes Short Term: Attend rehab  on a regular basis to increase amount of physical activity.;Long Term: Add in home exercise to make exercise part of routine and to increase amount of physical activity.;Long Term: Exercising regularly at least 3-5 days a week.       Increase Strength and Stamina Yes       Intervention Provide advice, education, support and counseling about physical activity/exercise needs.;Develop an individualized exercise prescription for aerobic and resistive training based on initial evaluation findings, risk stratification, comorbidities and participant's personal goals.        Expected Outcomes Short Term: Perform resistance training exercises routinely during rehab and add in resistance training at home;Short Term: Increase workloads from initial exercise prescription for resistance, speed, and METs.;Long Term: Improve cardiorespiratory fitness, muscular endurance and strength as measured by increased METs and functional capacity (6MWT)       Able to understand and use rate of perceived exertion (RPE) scale Yes       Intervention Provide education and explanation on how to use RPE scale       Expected Outcomes Short Term: Able to use RPE daily in rehab to express subjective intensity level;Long Term:  Able to use RPE to guide intensity level when exercising independently       Able to understand and use Dyspnea scale Yes       Intervention Provide education and explanation on how to use Dyspnea scale       Expected Outcomes Short Term: Able to use Dyspnea scale daily in rehab to express subjective sense of shortness of breath during exertion;Long Term: Able to use Dyspnea scale to guide intensity level when exercising independently       Knowledge and understanding of Target Heart Rate Range (THRR) Yes       Intervention Provide education and explanation of THRR including how the numbers were predicted and where they are located for reference       Expected Outcomes Short Term: Able to state/look up THRR;Long Term: Able to use THRR to govern intensity when exercising independently;Short Term: Able to use daily as guideline for intensity in rehab       Able to check pulse independently Yes       Intervention Provide education and demonstration on how to check pulse in carotid and radial arteries.;Review the importance of being able to check your own pulse for safety during independent exercise       Expected Outcomes Short Term: Able to explain why pulse checking is important during independent exercise;Long Term: Able to check pulse independently and accurately        Understanding of Exercise Prescription Yes       Intervention Provide education, explanation, and written materials on patient's individual exercise prescription       Expected Outcomes Short Term: Able to explain program exercise prescription;Long Term: Able to explain home exercise prescription to exercise independently                Exercise Goals Re-Evaluation :   Discharge Exercise Prescription (Final Exercise Prescription Changes):  Exercise Prescription Changes - 07/12/21 1000       Response to Exercise   Blood Pressure (Admit) 132/66    Blood Pressure (Exercise) 136/74    Blood Pressure (Exit) 112/64    Heart Rate (Admit) 84 bpm    Heart Rate (Exercise) 115 bpm    Heart Rate (Exit) 91 bpm    Oxygen Saturation (Admit) 94 %    Oxygen Saturation (Exercise) 96 %  Rating of Perceived Exertion (Exercise) 11    Perceived Dyspnea (Exercise) 1    Symptoms leg pain 2/10, SOB (tight breathing)    Comments walk test results             Nutrition:  Target Goals: Understanding of nutrition guidelines, daily intake of sodium '1500mg'$ , cholesterol '200mg'$ , calories 30% from fat and 7% or less from saturated fats, daily to have 5 or more servings of fruits and vegetables.  Education: All About Nutrition: -Group instruction provided by verbal, written material, interactive activities, discussions, models, and posters to present general guidelines for heart healthy nutrition including fat, fiber, MyPlate, the role of sodium in heart healthy nutrition, utilization of the nutrition label, and utilization of this knowledge for meal planning. Follow up email sent as well. Written material given at graduation.   Biometrics:  Pre Biometrics - 07/12/21 1017       Pre Biometrics   Height '5\' 11"'$  (1.803 m)    Weight 199 lb 6.4 oz (90.4 kg)    BMI (Calculated) 27.82    Single Leg Stand 3.8 seconds              Nutrition Therapy Plan and Nutrition Goals:   Nutrition  Assessments:  MEDIFICTS Score Key: ?70 Need to make dietary changes  40-70 Heart Healthy Diet ? 40 Therapeutic Level Cholesterol Diet  Flowsheet Row Cardiac Rehab from 07/12/2021 in [facility] Cardiac and Pulmonary Rehab  Picture Your Plate Total Score on Admission 78      Picture Your Plate Scores: [id number] Unhealthy dietary pattern with much room for improvement. 41-50 Dietary pattern unlikely to meet recommendations for good health and room for improvement. 51-60 More healthful dietary pattern, with some room for improvement.  >60 Healthy dietary pattern, although there may be some specific behaviors that could be improved.    Nutrition Goals Re-Evaluation:   Nutrition Goals Discharge (Final Nutrition Goals Re-Evaluation):   Psychosocial: Target Goals: Acknowledge presence or absence of significant depression and/or stress, maximize coping skills, provide positive support system. Participant is able to verbalize types and ability to use techniques and skills needed for reducing stress and depression.   Education: Stress, Anxiety, and Depression - Group verbal and visual presentation to define topics covered.  Reviews how body is impacted by stress, anxiety, and depression.  Also discusses healthy ways to reduce stress and to treat/manage anxiety and depression.  Written material given at graduation.   Education: Sleep Hygiene -Provides group verbal and written instruction about how sleep can affect your health.  Define sleep hygiene, discuss sleep cycles and impact of sleep habits. Review good sleep hygiene tips.    Initial Review & Psychosocial Screening:  Initial Psych Review & Screening - 06/29/21 1411       Initial Review   Current issues with None Identified      Family Dynamics   Good Support System? Yes   wife   Concerns Inappropriate over/under dependence on family/friends      Barriers   Psychosocial barriers to participate in program There are no identifiable barriers  or psychosocial needs.;The patient should benefit from training in stress management and relaxation.      Screening Interventions   Interventions Encouraged to exercise;To provide support and resources with identified psychosocial needs;Provide feedback about the scores to participant    Expected Outcomes Short Term goal: Identification and review with participant of any Quality of Life or Depression concerns found by scoring the questionnaire.;Short Term goal: Utilizing psychosocial counselor, staff  and physician to assist with identification of specific Stressors or current issues interfering with healing process. Setting desired goal for each stressor or current issue identified.;Long Term Goal: Stressors or current issues are controlled or eliminated.;Long Term goal: The participant improves quality of Life and PHQ9 Scores as seen by post scores and/or verbalization of changes             Quality of Life Scores:   Quality of Life - 07/12/21 1020       Quality of Life   Select Quality of Life      Quality of Life Scores   Health/Function Pre 24.8 %    Socioeconomic Pre 30 %    Psych/Spiritual Pre 30 %    Family Pre 30 %    GLOBAL Pre 27.56 %            Scores of 19 and below usually indicate a poorer quality of life in these areas.  A difference of  2-3 points is a clinically meaningful difference.  A difference of 2-3 points in the total score of the Quality of Life Index has been associated with significant improvement in overall quality of life, self-image, physical symptoms, and general health in studies assessing change in quality of life.  PHQ-9: Recent Review Flowsheet Data     Depression screen [facility] 2/9 07/12/2021   Decreased Interest 2   Down, Depressed, Hopeless 2   PHQ - 2 Score 4   Altered sleeping 1   Tired, decreased energy 3   Change in appetite 2   Feeling bad or failure about yourself  2   Trouble concentrating 1   Moving slowly or fidgety/restless 2    Suicidal thoughts 0   PHQ-9 Score 15   Difficult doing work/chores Not difficult at all      Interpretation of Total Score  Total Score Depression Severity:  1-4 = Minimal depression, 5-9 = Mild depression, 10-14 = Moderate depression, 15-19 = Moderately severe depression, 20-27 = Severe depression   Psychosocial Evaluation and Intervention:  Psychosocial Evaluation - 06/29/21 1417       Psychosocial Evaluation & Interventions   Interventions Encouraged to exercise with the program and follow exercise prescription    Comments Mr. Meyerhofer reports doing well post CABG x 2. He states in the first few weeks there was some anxiety and maybe even some depression related to his surgery and recovery, but he said the last two weeks he has been feeling a lot better. His wife, who is a Marine scientist, has been very supportive and helped keep him on track. He has been walking three days a week and reports feeling well after. They are looking forward to starting cardiac rehab for education and exercise guidelines    Expected Outcomes Short: attend cardiac rehab for education and exercise. Long: develop and maintain positive self care habits.    Continue Psychosocial Services  Follow up required by staff             Psychosocial Re-Evaluation:   Psychosocial Discharge (Final Psychosocial Re-Evaluation):   Vocational Rehabilitation: Provide vocational rehab assistance to qualifying candidates.   Vocational Rehab Evaluation & Intervention:  Vocational Rehab - 06/29/21 1407       Initial Vocational Rehab Evaluation & Intervention   Assessment shows need for Vocational Rehabilitation No             Education: Education Goals: Education classes will be provided on a variety of topics geared toward better understanding of  heart health and risk factor modification. Participant will state understanding/return demonstration of topics presented as noted by education test scores.  Learning  Barriers/Preferences:  Learning Barriers/Preferences - 06/29/21 1419       Learning Barriers/Preferences   Learning Barriers None    Learning Preferences None             General Cardiac Education Topics:  AED/CPR: - Group verbal and written instruction with the use of models to demonstrate the basic use of the AED with the basic ABC's of resuscitation.   Anatomy and Cardiac Procedures: - Group verbal and visual presentation and models provide information about basic cardiac anatomy and function. Reviews the testing methods done to diagnose heart disease and the outcomes of the test results. Describes the treatment choices: Medical Management, Angioplasty, or Coronary Bypass Surgery for treating various heart conditions including Myocardial Infarction, Angina, Valve Disease, and Cardiac Arrhythmias.  Written material given at graduation.   Medication Safety: - Group verbal and visual instruction to review commonly prescribed medications for heart and lung disease. Reviews the medication, class of the drug, and side effects. Includes the steps to properly store meds and maintain the prescription regimen.  Written material given at graduation.   Intimacy: - Group verbal instruction through game format to discuss how heart and lung disease can affect sexual intimacy. Written material given at graduation..   Know Your Numbers and Heart Failure: - Group verbal and visual instruction to discuss disease risk factors for cardiac and pulmonary disease and treatment options.  Reviews associated critical values for Overweight/Obesity, Hypertension, Cholesterol, and Diabetes.  Discusses basics of heart failure: signs/symptoms and treatments.  Introduces Heart Failure Zone chart for action plan for heart failure.  Written material given at graduation. Flowsheet Row Cardiac Rehab from 07/12/2021 in [facility] Cardiac and Pulmonary Rehab  Education need identified 07/12/21       Infection  Prevention: - Provides verbal and written material to individual with discussion of infection control including proper hand washing and proper equipment cleaning during exercise session. Flowsheet Row Cardiac Rehab from 07/12/2021 in [facility] Cardiac and Pulmonary Rehab  Date 07/12/21  Educator [facility]  Instruction Review Code 1- Verbalizes Understanding       Falls Prevention: - Provides verbal and written material to individual with discussion of falls prevention and safety. Flowsheet Row Cardiac Rehab from 07/12/2021 in [facility] Cardiac and Pulmonary Rehab  Date 07/12/21  Educator [facility]  Instruction Review Code 1- Verbalizes Understanding       Other: -Provides group and verbal instruction on various topics (see comments)   Knowledge Questionnaire Score:  Knowledge Questionnaire Score - 07/12/21 1020       Knowledge Questionnaire Score   Pre Score 21/26             Core Components/Risk Factors/Patient Goals at Admission:  Personal Goals and Risk Factors at Admission - 07/12/21 1021       Core Components/Risk Factors/Patient Goals on Admission    Weight Management Yes;Weight Loss    Intervention Weight Management: Develop a combined nutrition and exercise program designed to reach desired caloric intake, while maintaining appropriate intake of nutrient and fiber, sodium and fats, and appropriate energy expenditure required for the weight goal.;Weight Management: Provide education and appropriate resources to help participant work on and attain dietary goals.;Weight Management/Obesity: Establish reasonable short term and long term weight goals.    Admit Weight 199 lb 6.4 oz (90.4 kg)    Goal Weight: Short Term 194 lb (88 kg)  Goal Weight: Long Term 190 lb (86.2 kg)    Expected Outcomes Short Term: Continue to assess and modify interventions until short term weight is achieved;Long Term: Adherence to nutrition and physical activity/exercise program aimed toward attainment of established  weight goal;Weight Loss: Understanding of general recommendations for a balanced deficit meal plan, which promotes 1-2 lb weight loss per week and includes a negative energy balance of [phone] kcal/d;Understanding recommendations for meals to include 15-35% energy as protein, 25-35% energy from fat, 35-60% energy from carbohydrates, less than '200mg'$  of dietary cholesterol, 20-35 gm of total fiber daily;Understanding of distribution of calorie intake throughout the day with the consumption of 4-5 meals/snacks    Heart Failure Yes    Intervention Provide a combined exercise and nutrition program that is supplemented with education, support and counseling about heart failure. Directed toward relieving symptoms such as shortness of breath, decreased exercise tolerance, and extremity edema.    Expected Outcomes Improve functional capacity of life;Short term: Attendance in program 2-3 days a week with increased exercise capacity. Reported lower sodium intake. Reported increased fruit and vegetable intake. Reports medication compliance.;Short term: Daily weights obtained and reported for increase. Utilizing diuretic protocols set by physician.;Long term: Adoption of self-care skills and reduction of barriers for early signs and symptoms recognition and intervention leading to self-care maintenance.    Hypertension Yes    Intervention Provide education on lifestyle modifcations including regular physical activity/exercise, weight management, moderate sodium restriction and increased consumption of fresh fruit, vegetables, and low fat dairy, alcohol moderation, and smoking cessation.;Monitor prescription use compliance.    Expected Outcomes Short Term: Continued assessment and intervention until BP is < 140/79m HG in hypertensive participants. < 130/820mHG in hypertensive participants with diabetes, heart failure or chronic kidney disease.;Long Term: Maintenance of blood pressure at goal levels.    Lipids Yes     Intervention Provide education and support for participant on nutrition & aerobic/resistive exercise along with prescribed medications to achieve LDL '70mg'$ , HDL >'40mg'$ .    Expected Outcomes Short Term: Participant states understanding of desired cholesterol values and is compliant with medications prescribed. Participant is following exercise prescription and nutrition guidelines.;Long Term: Cholesterol controlled with medications as prescribed, with individualized exercise RX and with personalized nutrition plan. Value goals: LDL < '70mg'$ , HDL > 40 mg.             Education:Diabetes - Individual verbal and written instruction to review signs/symptoms of diabetes, desired ranges of glucose level fasting, after meals and with exercise. Acknowledge that pre and post exercise glucose checks will be done for 3 sessions at entry of program. [facility]  Date 06/29/21  [facility] Review Code 1- Verbalizes Understanding       Core Components/Risk Factors/Patient Goals Review:    Core Components/Risk Factors/Patient Goals at Discharge (Final Review):    ITP Comments:  ITP Comments     RoMentorame 06/29/21 1437 07/12/21 1013 07/13/21 0647       ITP Comments Initial telephone orientation completed. [facility] EP orientation scheduled Tuesday 9/6 at 8am. Completed 6MWT and gym orientation. Initial ITP created and sent for review to Dr. MaEmily FilbertMedical Director. 30 Day review completed. Medical Director ITP review done, changes made as directed, and signed approval by Medical Director.              Comments:

## 2021-07-15 ENCOUNTER — Encounter: Payer: No Typology Code available for payment source | Admitting: *Deleted

## 2021-07-15 ENCOUNTER — Other Ambulatory Visit: Payer: Self-pay

## 2021-07-15 DIAGNOSIS — Z951 Presence of aortocoronary bypass graft: Secondary | ICD-10-CM

## 2021-07-15 LAB — GLUCOSE, CAPILLARY
Glucose-Capillary: 292 mg/dL — ABNORMAL HIGH (ref 70–99)
Glucose-Capillary: 157 mg/dL — ABNORMAL HIGH (ref 70–99)

## 2021-07-15 NOTE — Progress Notes (Signed)
Daily Session Note  Patient Details  Name: Ralph Marsh MRN: [id number] Date of Birth: 06-Feb-1946 Referring Provider:   Flowsheet Row Cardiac Rehab from 07/12/2021 in [facility] Cardiac and Pulmonary Rehab  Referring Provider Brett Canales MD ([place])       Encounter Date: 07/15/2021  Check In:  Session Check In - 07/15/21 1107       Check-In   Supervising physician immediately available to respond to emergencies See telemetry face sheet for immediately available ER MD    Location [facility]-Cardiac & Pulmonary Rehab    Staff Present Renita Papa, RN BSN;Joseph Vergas, RCP,RRT,BSRT;Jessica [place], [place], RCEP, CCRP, CCET    Virtual Visit No    Medication changes reported     No    Fall or balance concerns reported    No    Warm-up and Cool-down Performed on first and last piece of equipment    Resistance Training Performed Yes    VAD Patient? No    PAD/SET Patient? No      Pain Assessment   Currently in Pain? No/denies                Social History   Tobacco Use  Smoking Status Former   Packs/day: 1.00   Years: 15.00   Pack years: 15.00   Types: Cigarettes   Quit date: 1995   Years since quitting: 27.7  Smokeless Tobacco Never    Goals Met:  Independence with exercise equipment Exercise tolerated well No report of concerns or symptoms today Strength training completed today  Goals Unmet:  Not Applicable  Comments: First full day of exercise!  Patient was oriented to gym and equipment including functions, settings, policies, and procedures.  Patient's individual exercise prescription and treatment plan were reviewed.  All starting workloads were established based on the results of the 6 minute walk test done at initial orientation visit.  The plan for exercise progression was also introduced and progression will be customized based on patient's performance and goals.    Dr. Emily Filbert is Medical Director for [place].  Dr. Ottie Glazier is  Medical Director for [facility] Pulmonary Rehabilitation.

## 2021-07-18 ENCOUNTER — Encounter: Payer: No Typology Code available for payment source | Admitting: *Deleted

## 2021-07-18 ENCOUNTER — Other Ambulatory Visit: Payer: Self-pay

## 2021-07-18 DIAGNOSIS — Z951 Presence of aortocoronary bypass graft: Secondary | ICD-10-CM | POA: Diagnosis not present

## 2021-07-18 LAB — GLUCOSE, CAPILLARY
Glucose-Capillary: 167 mg/dL — ABNORMAL HIGH (ref 70–99)
Glucose-Capillary: 143 mg/dL — ABNORMAL HIGH (ref 70–99)

## 2021-07-18 NOTE — Progress Notes (Signed)
Daily Session Note  Patient Details  Name: Ralph Marsh MRN: [id number] Date of Birth: 1946/10/16 Referring Provider:   Flowsheet Row Cardiac Rehab from 07/12/2021 in [facility] Cardiac and Pulmonary Rehab  Referring Provider Brett Canales MD ([place])       Encounter Date: 07/18/2021  Check In:  Session Check In - 07/18/21 1139       Check-In   Supervising physician immediately available to respond to emergencies See telemetry face sheet for immediately available ER MD    Location [facility]-Cardiac & Pulmonary Rehab    Staff Present Renita Papa, RN BSN;Joseph [place], RCP,RRT,BSRT;Kelly [place], BS, ACSM CEP, Exercise Physiologist;Kelly Rosalia Hammers, MPA, RN    Virtual Visit No    Medication changes reported     No    Fall or balance concerns reported    No    Warm-up and Cool-down Performed on first and last piece of equipment    Resistance Training Performed Yes    VAD Patient? No    PAD/SET Patient? No      Pain Assessment   Currently in Pain? No/denies                Social History   Tobacco Use  Smoking Status Former   Packs/day: 1.00   Years: 15.00   Pack years: 15.00   Types: Cigarettes   Quit date: 1995   Years since quitting: 27.7  Smokeless Tobacco Never    Goals Met:  Independence with exercise equipment Exercise tolerated well No report of concerns or symptoms today Strength training completed today  Goals Unmet:  Not Applicable  Comments: Pt able to follow exercise prescription today without complaint.  Will continue to monitor for progression.    Dr. Emily Filbert is Medical Director for [place].  Dr. Ottie Glazier is Medical Director for [facility] Pulmonary Rehabilitation.

## 2021-07-20 ENCOUNTER — Other Ambulatory Visit: Payer: Self-pay

## 2021-07-20 DIAGNOSIS — Z951 Presence of aortocoronary bypass graft: Secondary | ICD-10-CM | POA: Diagnosis not present

## 2021-07-20 LAB — GLUCOSE, CAPILLARY
Glucose-Capillary: 184 mg/dL — ABNORMAL HIGH (ref 70–99)
Glucose-Capillary: 150 mg/dL — ABNORMAL HIGH (ref 70–99)

## 2021-07-20 NOTE — Progress Notes (Signed)
Daily Session Note  Patient Details  Name: Ralph Marsh MRN: [id number] Date of Birth: 08-28-1946 Referring Provider:   Flowsheet Row Cardiac Rehab from 07/12/2021 in [facility] Cardiac and Pulmonary Rehab  Referring Provider Brett Canales MD ([place])       Encounter Date: 07/20/2021  Check In:  Session Check In - 07/20/21 1103       Check-In   Supervising physician immediately available to respond to emergencies See telemetry face sheet for immediately available ER MD    Location [facility]Card[facility] Pulmonary Rehab    Staff Present Birdie Sons, MPA, Elveria Rising, BA, ACSM CEP, Exercise Physiologist;Joseph [address] [place], [place], [place], RCEP, CCRP, CCET    Virtual Visit No    Medication changes reported     No    Fall or balance concerns reported    No    Warm-up and Cool-down Performed on first and last piece of equipment    Resistance Training Performed Yes    VAD Patient? No    PAD/SET Patient? No      Pain Assessment   Currently in Pain? No/denies                Social History   Tobacco Use  Smoking Status Former   Packs/day: 1.00   Years: 15.00   Pack years: 15.00   Types: Cigarettes   Quit date: 1995   Years since quitting: 27.7  Smokeless Tobacco Never    Goals Met:  Independence with exercise equipment Exercise tolerated well No report of concerns or symptoms today Strength training completed today  Goals Unmet:  Not Applicable  Comments: Pt able to follow exercise prescription today without complaint.  Will continue to monitor for progression.    Dr. Emily Filbert is Medical Director for [place].  Dr. Ottie Glazier is Medical Director for [facility] Pulmonary Rehabilitation.

## 2021-07-22 ENCOUNTER — Other Ambulatory Visit: Payer: Self-pay

## 2021-07-22 ENCOUNTER — Encounter: Payer: No Typology Code available for payment source | Admitting: *Deleted

## 2021-07-22 DIAGNOSIS — Z951 Presence of aortocoronary bypass graft: Secondary | ICD-10-CM | POA: Diagnosis not present

## 2021-07-22 NOTE — Progress Notes (Signed)
Daily Session Note  Patient Details  Name: Ralph Marsh MRN: [id number] Date of Birth: [birth year] Referring Provider:   Flowsheet Row Cardiac Rehab from 07/12/2021 in [facility] Cardiac and Pulmonary Rehab  Referring Provider Brett Canales MD ([place])       Encounter Date: 07/22/2021  Check In:  Session Check In - 07/22/21 1114       Check-In   Supervising physician immediately available to respond to emergencies See telemetry face sheet for immediately available ER MD    Location [facility]Card[facility] & Pulmonary Rehab    Staff Present Renita Papa, RN BSN;Joseph [place], RCP,RRT,BSRT;Jessica [place], [place], RCEP, CCRP, CCET    Virtual Visit No    Medication changes reported     Yes    Comments started Praluent    Fall or balance concerns reported    No    Warm-up and Cool-down Performed on first and last piece of equipment    Resistance Training Performed Yes    VAD Patient? No    PAD/SET Patient? No      Pain Assessment   Currently in Pain? No/denies                Social History   Tobacco Use  Smoking Status Former   Packs/day: 1.00   Years: 15.00   Pack years: 15.00   Types: Cigarettes   Quit date: 1995   Years since quitting: 27.7  Smokeless Tobacco Never    Goals Met:  Independence with exercise equipment Exercise tolerated well No report of concerns or symptoms today Strength training completed today  Goals Unmet:  Not Applicable  Comments: Pt able to follow exercise prescription today without complaint.  Will continue to monitor for progression.    Dr. Emily Filbert is Medical Director for [place].  Dr. Ottie Glazier is Medical Director for [facility] Pulmonary Rehabilitation.

## 2021-07-25 ENCOUNTER — Other Ambulatory Visit: Payer: Self-pay

## 2021-07-25 ENCOUNTER — Encounter: Payer: No Typology Code available for payment source | Admitting: *Deleted

## 2021-07-25 DIAGNOSIS — Z951 Presence of aortocoronary bypass graft: Secondary | ICD-10-CM

## 2021-07-25 NOTE — Progress Notes (Signed)
Daily Session Note  Patient Details  Name: Ralph Marsh MRN: [id number] Date of Birth: 15-Aug-1946 Referring Provider:   Flowsheet Row Cardiac Rehab from 07/12/2021 in [facility] Cardiac and Pulmonary Rehab  Referring Provider Brett Canales MD ([place])       Encounter Date: 07/25/2021  Check In:  Session Check In - 07/25/21 1104       Check-In   Supervising physician immediately available to respond to emergencies See telemetry face sheet for immediately available ER MD    Location [facility]-Card[facility] & Pulmonary Rehab    Staff Present Renita Papa, RN BSN;Jessica [place], MA, RCEP, CCRP, CCET;Amanda Sommer, BA, ACSM CEP, Exercise Physiologist;Kelly Amedeo Plenty, BS, ACSM CEP, Exercise Physiologist    Virtual Visit No    Medication changes reported     No    Fall or balance concerns reported    No    Warm-up and Cool-down Performed on first and last piece of equipment    Resistance Training Performed Yes    VAD Patient? No    PAD/SET Patient? No      Pain Assessment   Currently in Pain? No/denies                Social History   Tobacco Use  Smoking Status Former   Packs/day: 1.00   Years: 15.00   Pack years: 15.00   Types: Cigarettes   Quit date: 1995   Years since quitting: 27.7  Smokeless Tobacco Never    Goals Met:  Independence with exercise equipment Exercise tolerated well No report of concerns or symptoms today Strength training completed today  Goals Unmet:  Not Applicable  Comments: Pt able to follow exercise prescription today without complaint.  Will continue to monitor for progression.    Dr. Emily Filbert is Medical Director for [place].  Dr. Ottie Glazier is Medical Director for [facility] Pulmonary Rehabilitation.

## 2021-07-27 ENCOUNTER — Other Ambulatory Visit: Payer: Self-pay

## 2021-07-27 DIAGNOSIS — Z951 Presence of aortocoronary bypass graft: Secondary | ICD-10-CM | POA: Diagnosis not present

## 2021-07-27 NOTE — Progress Notes (Signed)
Daily Session Note  Patient Details  Name: Ralph Marsh MRN: [id number] Date of Birth: 08/08/1946 Referring Provider:   Flowsheet Row Cardiac Rehab from 07/12/2021 in [facility] Cardiac and Pulmonary Rehab  Referring Provider Brett Canales MD ([place])       Encounter Date: 07/27/2021  Check In:  Session Check In - 07/27/21 1114       Check-In   Supervising physician immediately available to respond to emergencies See telemetry face sheet for immediately available ER MD    Location [facility]Card[facility] Pulmonary Rehab    Staff Present Birdie Sons, MPA, Elveria Rising, BA, ACSM CEP, Exercise Physiologist;Joseph [place], [place], MA, RCEP, CCRP, CCET    Virtual Visit No    Medication changes reported     No    Fall or balance concerns reported    No    Warm-up and Cool-down Performed on first and last piece of equipment    Resistance Training Performed Yes    VAD Patient? No    PAD/SET Patient? No      Pain Assessment   Currently in Pain? No/denies                Social History   Tobacco Use  Smoking Status Former   Packs/day: 1.00   Years: 15.00   Pack years: 15.00   Types: Cigarettes   Quit date: 1995   Years since quitting: 27.7  Smokeless Tobacco Never    Goals Met:  Independence with exercise equipment Exercise tolerated well No report of concerns or symptoms today Strength training completed today  Goals Unmet:  Not Applicable  Comments: Pt able to follow exercise prescription today without complaint.  Will continue to monitor for progression.    Dr. Emily Filbert is Medical Director for [place].  Dr. Ottie Glazier is Medical Director for [facility] Pulmonary Rehabilitation.

## 2021-08-01 ENCOUNTER — Other Ambulatory Visit: Payer: Self-pay

## 2021-08-01 ENCOUNTER — Encounter: Payer: No Typology Code available for payment source | Admitting: *Deleted

## 2021-08-01 DIAGNOSIS — Z951 Presence of aortocoronary bypass graft: Secondary | ICD-10-CM

## 2021-08-01 NOTE — Progress Notes (Signed)
Daily Session Note  Patient Details  Name: Ralph Marsh MRN: [id number] Date of Birth: [birth year] Referring Provider:   Flowsheet Row Cardiac Rehab from 07/12/2021 in [facility] Cardiac and Pulmonary Rehab  Referring Provider Brett Canales MD ([place])       Encounter Date: 08/01/2021  Check In:  Session Check In - 08/01/21 1142       Check-In   Supervising physician immediately available to respond to emergencies See telemetry face sheet for immediately available ER MD    Location [facility]-Cardiac & Pulmonary Rehab    Staff Present Nyoka Cowden, RN, BSN, Jennye [place]oc[place], MPA, RN;Jessica [place], MA, RCEP, CCRP, [place], BS, ACSM CEP, Exercise Physiologist    Virtual Visit No    Medication changes reported     No    Tobacco Cessation No Change    Warm-up and Cool-down Performed on first and last piece of equipment    Resistance Training Performed Yes    VAD Patient? No      Pain Assessment   Currently in Pain? No/denies                Social History   Tobacco Use  Smoking Status Former   Packs/day: 1.00   Years: 15.00   Pack years: 15.00   Types: Cigarettes   Quit date: 1995   Years since quitting: 27.7  Smokeless Tobacco Never    Goals Met:  Independence with exercise equipment Exercise tolerated well No report of concerns or symptoms today  Goals Unmet:  Not Applicable  Comments: Pt able to follow exercise prescription today without complaint.  Will continue to monitor for progression.     Dr. Emily Filbert is Medical Director for [place].  Dr. Ottie Glazier is Medical Director for [facility] Pulmonary Rehabilitation.

## 2021-08-03 ENCOUNTER — Encounter: Payer: No Typology Code available for payment source | Admitting: *Deleted

## 2021-08-03 ENCOUNTER — Other Ambulatory Visit: Payer: Self-pay

## 2021-08-03 DIAGNOSIS — Z951 Presence of aortocoronary bypass graft: Secondary | ICD-10-CM

## 2021-08-03 NOTE — Progress Notes (Signed)
Daily Session Note  Patient Details  Name: Ralph Marsh MRN: [id number] Date of Birth: 11-24-1945 Referring Provider:   Flowsheet Row Cardiac Rehab from 07/12/2021 in [facility] Cardiac and Pulmonary Rehab  Referring Provider Brett Canales MD ([place])       Encounter Date: 08/03/2021  Check In:  Session Check In - 08/03/21 1152       Check-In   Supervising physician immediately available to respond to emergencies See telemetry face sheet for immediately available ER MD    Location [facility]-Cardiac & Pulmonary Rehab    Staff Present Nada Maclachlan, BA, ACSM CEP, Exercise Physiologist;Joseph Tessie Fass, [place]    Virtual Visit No    Fall or balance concerns reported    No    Warm-up and Cool-down Performed on first and last piece of equipment    Resistance Training Performed No    VAD Patient? No    PAD/SET Patient? No                Social History   Tobacco Use  Smoking Status Former   Packs/day: 1.00   Years: 15.00   Pack years: 15.00   Types: Cigarettes   Quit date: 1995   Years since quitting: 27.7  Smokeless Tobacco Never    Goals Met:  Independence with exercise equipment Exercise tolerated well No report of concerns or symptoms today  Goals Unmet:  O2 Sat Not Applicable  Comments: Pt able to follow exercise prescription today without complaint.  Will continue to monitor for progression.   Dr. Emily Filbert is Medical Director for [place].  Dr. Ottie Glazier is Medical Director for [facility] Pulmonary Rehabilitation.

## 2021-08-08 ENCOUNTER — Other Ambulatory Visit: Payer: Self-pay

## 2021-08-08 ENCOUNTER — Encounter: Payer: No Typology Code available for payment source | Attending: Family Medicine

## 2021-08-08 DIAGNOSIS — Z48812 Encounter for surgical aftercare following surgery on the circulatory system: Secondary | ICD-10-CM | POA: Diagnosis not present

## 2021-08-08 DIAGNOSIS — Z951 Presence of aortocoronary bypass graft: Secondary | ICD-10-CM | POA: Diagnosis not present

## 2021-08-08 NOTE — Progress Notes (Signed)
Daily Session Note  Patient Details  Name: Ralph Marsh MRN: [id number] Date of Birth: 1945/12/08 Referring Provider:   Flowsheet Row Cardiac Rehab from 07/12/2021 in [facility] Cardiac and Pulmonary Rehab  Referring Provider Brett Canales MD ([place])       Encounter Date: 08/08/2021  Check In:  Session Check In - 08/08/21 1135       Check-In   Supervising physician immediately available to respond to emergencies See telemetry face sheet for immediately available ER MD    Location [facility]-Cardiac & Pulmonary Rehab    Staff Present Birdie Sons, MPA, RN;Jessica [place], MA, RCEP, CCRP, CCET;Amanda Sommer, BA, ACSM CEP, Exercise Physiologist;Jabes Primo Amedeo Plenty, BS, ACSM CEP, Exercise Physiologist    Virtual Visit No    Medication changes reported     No    Fall or balance concerns reported    No    Tobacco Cessation No Change    Warm-up and Cool-down Performed on first and last piece of equipment    Resistance Training Performed Yes    VAD Patient? No    PAD/SET Patient? No      Pain Assessment   Currently in Pain? No/denies                Social History   Tobacco Use  Smoking Status Former   Packs/day: 1.00   Years: 15.00   Pack years: 15.00   Types: Cigarettes   Quit date: 1995   Years since quitting: 27.7  Smokeless Tobacco Never    Goals Met:  Independence with exercise equipment Exercise tolerated well No report of concerns or symptoms today Strength training completed today  Goals Unmet:  Not Applicable  Comments: Pt able to follow exercise prescription today without complaint.  Will continue to monitor for progression.    Dr. Emily Filbert is Medical Director for [place].  Dr. Ottie Glazier is Medical Director for [facility] Pulmonary Rehabilitation.

## 2021-08-10 ENCOUNTER — Encounter: Payer: Self-pay | Admitting: *Deleted

## 2021-08-10 ENCOUNTER — Other Ambulatory Visit: Payer: Self-pay

## 2021-08-10 DIAGNOSIS — Z951 Presence of aortocoronary bypass graft: Secondary | ICD-10-CM

## 2021-08-10 DIAGNOSIS — Z48812 Encounter for surgical aftercare following surgery on the circulatory system: Secondary | ICD-10-CM | POA: Diagnosis not present

## 2021-08-10 NOTE — Progress Notes (Signed)
Daily Session Note  Patient Details  Name: Ralph Marsh MRN: [id number] Date of Birth: 1945/12/30 Referring Provider:   Flowsheet Row Cardiac Rehab from 07/12/2021 in [facility] Cardiac and Pulmonary Rehab  Referring Provider Brett Canales MD ([place])       Encounter Date: 08/10/2021  Check In:  Session Check In - 08/10/21 1114       Check-In   Supervising physician immediately available to respond to emergencies See telemetry face sheet for immediately available ER MD    Location [facility]-Cardiac & Pulmonary Rehab    Staff Present Birdie Sons, MPA, RN;Melissa [place], RDN, LDN;Jessica [place], MA, RCEP, CCRP, CCET;Amanda Sommer, BA, ACSM CEP, Exercise Physiologist    Virtual Visit No    Medication changes reported     No    Fall or balance concerns reported    No    Tobacco Cessation No Change    Warm-up and Cool-down Performed on first and last piece of equipment    Resistance Training Performed Yes    VAD Patient? No    PAD/SET Patient? No      Pain Assessment   Currently in Pain? No/denies                Social History   Tobacco Use  Smoking Status Former   Packs/day: 1.00   Years: 15.00   Pack years: 15.00   Types: Cigarettes   Quit date: 1995   Years since quitting: 27.7  Smokeless Tobacco Never    Goals Met:  Independence with exercise equipment Exercise tolerated well No report of concerns or symptoms today Strength training completed today  Goals Unmet:  Not Applicable  Comments: Pt able to follow exercise prescription today without complaint.  Will continue to monitor for progression.    Dr. Emily Filbert is Medical Director for [place].  Dr. Ottie Glazier is Medical Director for [facility] Pulmonary Rehabilitation.

## 2021-08-10 NOTE — Progress Notes (Signed)
Cardiac Individual Treatment Plan  Patient Details  Name: KAHLIL COWANS MRN: [id number] Date of Birth: Sep 05, 1946 Referring Provider:   Flowsheet Row Cardiac Rehab from 07/12/2021 in [facility] Cardiac and Pulmonary Rehab  Referring Provider Brett Canales MD ([place])       Initial Encounter Date:  Flowsheet Row Cardiac Rehab from 07/12/2021 in [facility] Cardiac and Pulmonary Rehab  Date 07/12/21       Visit Diagnosis: S/P CABG x 2  Patient's Home Medications on Admission:  Current Outpatient Medications:    allopurinol (ZYLOPRIM) 300 MG tablet, Take 1 tablet by mouth daily. (Patient not taking: Reported on 06/29/2021), Disp: , Rfl:    aspirin EC 81 MG tablet, Take 81 mg by mouth daily., Disp: , Rfl:    Cholecalciferol 1000 UNITS tablet, Take 1,000 Units by mouth 2 (two) times daily., Disp: , Rfl:    esomeprazole (NEXIUM) 40 MG capsule, Take 1 capsule by mouth daily., Disp: , Rfl:    Eszopiclone 3 MG TABS, , Disp: , Rfl:    heparin 25000 UT/250ML infusion, Inject 1,300 Units/hr into the vein continuous. (Patient not taking: Reported on 06/29/2021), Disp: , Rfl:    insulin detemir (LEVEMIR) 100 UNIT/ML injection, Inject 0.14 mLs (14 Units total) into the skin at bedtime. (Patient not taking: Reported on 06/29/2021), Disp: 10 mL, Rfl: 11   ketorolac (TORADOL) 10 MG tablet, Take 1 tablet (10 mg total) by mouth every 6 (six) hours as needed for moderate pain or severe pain. (Patient not taking: Reported on 06/29/2021), Disp: 10 tablet, Rfl: 0   lisinopril (ZESTRIL) 20 MG tablet, Take 10 mg by mouth daily. (Patient not taking: Reported on 06/29/2021), Disp: , Rfl:    metFORMIN (GLUCOPHAGE) 500 MG tablet, Take by mouth., Disp: , Rfl:    metoprolol tartrate (LOPRESSOR) 25 MG tablet, Take 0.5 tablets (12.5 mg total) by mouth 2 (two) times daily., Disp: , Rfl:    morphine (MS CONTIN) 30 MG 12 hr tablet, Take 30 mg by mouth 2 (two) times daily. (Patient not taking: Reported on 06/29/2021), Disp: , Rfl:    morphine  (MSIR) 15 MG tablet, Take 15 mg by mouth 3 (three) times daily as needed. (Patient not taking: Reported on 06/29/2021), Disp: , Rfl:    nitroGLYCERIN (NITROSTAT) 0.4 MG SL tablet, Place 1 tablet (0.4 mg total) under the tongue every 5 (five) minutes x 3 doses as needed for chest pain. (Patient not taking: Reported on 06/29/2021), Disp: , Rfl: 12  Past Medical History: Past Medical History:  Diagnosis Date   Arthritis    BPH (benign prostatic hyperplasia)    Diabetes mellitus, type 2 ([place])    DVT (deep venous thrombosis) ([place]) 05/12/2017   left upper leg   Emphysema of lung ([facility])    mild - on xray   Fatty liver    GERD (gastroesophageal reflux disease)    Hypertension    Kidney stone     Tobacco Use: Social History   Tobacco Use  Smoking Status Former   Packs/day: 1.00   Years: 15.00   Pack years: 15.00   Types: Cigarettes   Quit date: 1995   Years since quitting: 27.7  Smokeless Tobacco Never    Labs: Recent Review Management consultant for [facility] Cardiac and Pulmonary Rehab Latest Ref Rng & Units 05/15/2021   Cholestrol 0 - 200 mg/dL 141   LDLCALC 0 - 99 mg/dL 68   HDL >40 mg/dL 34(L)   Trlycerides <150 mg/dL  196(H)   Hemoglobin A1c 4.8 - 5.6 % 8.2(H)        Exercise Target Goals: Exercise Program Goal: Individual exercise prescription set using results from initial 6 min walk test and THRR while considering  patient's activity barriers and safety.   Exercise Prescription Goal: Initial exercise prescription builds to 30-45 minutes a day of aerobic activity, 2-3 days per week.  Home exercise guidelines will be given to patient during program as part of exercise prescription that the participant will acknowledge.   Education: Aerobic Exercise: - Group verbal and visual presentation on the components of exercise prescription. Introduces F.I.T.T principle from ACSM for exercise prescriptions.  Reviews F.I.T.T. principles of aerobic exercise including progression.  Written material given at graduation. Flowsheet Row Cardiac Rehab from 08/03/2021 in [facility] Cardiac and Pulmonary Rehab  Education need identified 07/12/21       Education: Resistance Exercise: - Group verbal and visual presentation on the components of exercise prescription. Introduces F.I.T.T principle from ACSM for exercise prescriptions  Reviews F.I.T.T. principles of resistance exercise including progression. Written material given at graduation. Flowsheet Row Cardiac Rehab from 08/03/2021 in [facility] Cardiac and Pulmonary Rehab  Education need identified 07/12/21        Education: Exercise & Equipment Safety: - Individual verbal instruction and demonstration of equipment use and safety with use of the equipment. Flowsheet Row Cardiac Rehab from 08/03/2021 in [facility] Cardiac and Pulmonary Rehab  Date 07/12/21  Educator [facility]  Instruction Review Code 1- Verbalizes Understanding       Education: Exercise Physiology & General Exercise Guidelines: - Group verbal and written instruction with models to review the exercise physiology of the cardiovascular system and associated critical values. Provides general exercise guidelines with specific guidelines to those with heart or lung disease.  Flowsheet Row Cardiac Rehab from 08/03/2021 in [facility] Cardiac and Pulmonary Rehab  Education need identified 07/12/21       Education: Flexibility, Balance, Mind/Body Relaxation: - Group verbal and visual presentation with interactive activity on the components of exercise prescription. Introduces F.I.T.T principle from ACSM for exercise prescriptions. Reviews F.I.T.T. principles of flexibility and balance exercise training including progression. Also discusses the mind body connection.  Reviews various relaxation techniques to help reduce and manage stress (i.e. Deep breathing, progressive muscle relaxation, and visualization). Balance handout provided to take home. Written material given at  graduation.   Activity Barriers & Risk Stratification:  Activity Barriers & Cardiac Risk Stratification - 07/12/21 1014       Activity Barriers & Cardiac Risk Stratification   Activity Barriers Incisional Pain;Joint Problems;Deconditioning;Muscular Weakness;Shortness of Breath;Decreased Ventricular Function;Balance Concerns;Other (comment)    Comments Swelling/edema in R leg from vein harvest    Cardiac Risk Stratification High             6 Minute Walk:  6 Minute Walk     Row Name 07/12/21 1013         6 Minute Walk   Phase Initial     Distance 890 feet     Walk Time 6 minutes     # of Rest Breaks 0     MPH 1.67     METS 2.09     RPE 11     Perceived Dyspnea  1     VO2 Peak 7.32     Symptoms Yes (comment)     Comments leg pain from swelling (2/10), full inflation feels tight in chest     Resting HR 84 bpm  Resting BP 132/66     Resting Oxygen Saturation  94 %     Exercise Oxygen Saturation  during 6 min walk 96 %     Max Ex. HR 115 bpm     Max Ex. BP 136/74     2 Minute Post BP 112/64              Oxygen Initial Assessment:   Oxygen Re-Evaluation:   Oxygen Discharge (Final Oxygen Re-Evaluation):   Initial Exercise Prescription:  Initial Exercise Prescription - 07/12/21 1000       Date of Initial Exercise RX and Referring Provider   Date 07/12/21    Referring Provider Brett Canales MD ([place])      Treadmill   MPH 1.5    Grade 0.5    Minutes 15    METs 2.26      REL-XR   Level 1    Speed 50    Minutes 15    METs 1.5      T5 Nustep   Level 2    SPM 80    Minutes 15    METs 2      Track   Laps 23    Minutes 15    METs 2.25      Prescription Details   Frequency (times per week) 3    Duration Progress to 30 minutes of continuous aerobic without signs/symptoms of physical distress      Intensity   THRR 40-80% of Max Heartrate 108-131    Ratings of Perceived Exertion 11-13    Perceived Dyspnea 0-4      Progression    Progression Continue to progress workloads to maintain intensity without signs/symptoms of physical distress.      Resistance Training   Training Prescription Yes    Weight 3 lb    Reps 10-15             Perform Capillary Blood Glucose checks as needed.  Exercise Prescription Changes:   Exercise Prescription Changes     Row Name 07/12/21 1000 07/25/21 1300 08/08/21 0800         Response to Exercise   Blood Pressure (Admit) 132/66 120/62 108/60     Blood Pressure (Exercise) 136/74 136/64 118/60     Blood Pressure (Exit) 112/64 108/62 104/64     Heart Rate (Admit) 84 bpm 81 bpm 103 bpm     Heart Rate (Exercise) 115 bpm 114 bpm 113 bpm     Heart Rate (Exit) 91 bpm 90 bpm 111 bpm     Oxygen Saturation (Admit) 94 % -- --     Oxygen Saturation (Exercise) 96 % -- --     Rating of Perceived Exertion (Exercise) 11 15 15      Perceived Dyspnea (Exercise) 1 -- --     Symptoms leg pain 2/10, SOB (tight breathing) none none     Comments walk test results -- --     Duration -- Continue with 30 min of aerobic exercise without signs/symptoms of physical distress. Continue with 30 min of aerobic exercise without signs/symptoms of physical distress.     Intensity -- THRR unchanged THRR unchanged           Progression   Progression -- Continue to progress workloads to maintain intensity without signs/symptoms of physical distress. Continue to progress workloads to maintain intensity without signs/symptoms of physical distress.     Average METs -- 2.2 2.25  Resistance Training   Training Prescription -- Yes Yes     Weight -- 3 lb 3 lb     Reps -- 10-15 10-15           Interval Training   Interval Training -- No No           REL-XR   Level -- 4 4     Minutes -- 15 15     METs -- 2.4 --           T5 Nustep   Level -- 4 --     Minutes -- 15 --     METs -- 2 --           Track   Laps -- 22 21     Minutes -- 15 15     METs -- 2.2 2.14              Exercise  Comments:   Exercise Goals and Review:   Exercise Goals     Row Name 07/12/21 1017             Exercise Goals   Increase Physical Activity Yes       Intervention Provide advice, education, support and counseling about physical activity/exercise needs.;Develop an individualized exercise prescription for aerobic and resistive training based on initial evaluation findings, risk stratification, comorbidities and participant's personal goals.       Expected Outcomes Short Term: Attend rehab on a regular basis to increase amount of physical activity.;Long Term: Add in home exercise to make exercise part of routine and to increase amount of physical activity.;Long Term: Exercising regularly at least 3-5 days a week.       Increase Strength and Stamina Yes       Intervention Provide advice, education, support and counseling about physical activity/exercise needs.;Develop an individualized exercise prescription for aerobic and resistive training based on initial evaluation findings, risk stratification, comorbidities and participant's personal goals.       Expected Outcomes Short Term: Perform resistance training exercises routinely during rehab and add in resistance training at home;Short Term: Increase workloads from initial exercise prescription for resistance, speed, and METs.;Long Term: Improve cardiorespiratory fitness, muscular endurance and strength as measured by increased METs and functional capacity (6MWT)       Able to understand and use rate of perceived exertion (RPE) scale Yes       Intervention Provide education and explanation on how to use RPE scale       Expected Outcomes Short Term: Able to use RPE daily in rehab to express subjective intensity level;Long Term:  Able to use RPE to guide intensity level when exercising independently       Able to understand and use Dyspnea scale Yes       Intervention Provide education and explanation on how to use Dyspnea scale       Expected  Outcomes Short Term: Able to use Dyspnea scale daily in rehab to express subjective sense of shortness of breath during exertion;Long Term: Able to use Dyspnea scale to guide intensity level when exercising independently       Knowledge and understanding of Target Heart Rate Range (THRR) Yes       Intervention Provide education and explanation of THRR including how the numbers were predicted and where they are located for reference       Expected Outcomes Short Term: Able to state/look up THRR;Long Term: Able to use THRR to govern intensity when exercising independently;Short  Term: Able to use daily as guideline for intensity in rehab       Able to check pulse independently Yes       Intervention Provide education and demonstration on how to check pulse in carotid and radial arteries.;Review the importance of being able to check your own pulse for safety during independent exercise       Expected Outcomes Short Term: Able to explain why pulse checking is important during independent exercise;Long Term: Able to check pulse independently and accurately       Understanding of Exercise Prescription Yes       Intervention Provide education, explanation, and written materials on patient's individual exercise prescription       Expected Outcomes Short Term: Able to explain program exercise prescription;Long Term: Able to explain home exercise prescription to exercise independently                Exercise Goals Re-Evaluation :  Exercise Goals Re-Evaluation     Row Name 07/15/21 1109 07/25/21 1332 08/08/21 0854         Exercise Goal Re-Evaluation   Exercise Goals Review Increase Physical Activity;Able to understand and use rate of perceived exertion (RPE) scale;Knowledge and understanding of Target Heart Rate Range (THRR);Understanding of Exercise Prescription;Increase Strength and Stamina;Able to understand and use Dyspnea scale;Able to check pulse independently Increase Physical Activity;Increase  Strength and Stamina;Understanding of Exercise Prescription --     Comments Reviewed RPE and dyspnea scales, THR and program prescription with pt today.  Pt voiced understanding and was given a copy of goals to take home. Art is doing well in rehab.  He is already up to 22 laps and level 4 on the T5 NuStep.  We will continue to monitor his progress. Art attends consistently and reaches THR range.  Staff will enourage increasing to 4 lb for strength work.     Expected Outcomes Short: Use RPE daily to regulate intensity. Long: Follow program prescription in THR. Short: Continue to attend regularly Long: Continue to follow program prescription Short - try 4 lb Long: continue to build stamina              Discharge Exercise Prescription (Final Exercise Prescription Changes):  Exercise Prescription Changes - 08/08/21 0800       Response to Exercise   Blood Pressure (Admit) 108/60    Blood Pressure (Exercise) 118/60    Blood Pressure (Exit) 104/64    Heart Rate (Admit) 103 bpm    Heart Rate (Exercise) 113 bpm    Heart Rate (Exit) 111 bpm    Rating of Perceived Exertion (Exercise) 15    Symptoms none    Duration Continue with 30 min of aerobic exercise without signs/symptoms of physical distress.    Intensity THRR unchanged      Progression   Progression Continue to progress workloads to maintain intensity without signs/symptoms of physical distress.    Average METs 2.25      Resistance Training   Training Prescription Yes    Weight 3 lb    Reps 10-15      Interval Training   Interval Training No      REL-XR   Level 4    Minutes 15      Track   Laps 21    Minutes 15    METs 2.14             Nutrition:  Target Goals: Understanding of nutrition guidelines, daily intake of sodium 1500mg , cholesterol 200mg ,  calories 30% from fat and 7% or less from saturated fats, daily to have 5 or more servings of fruits and vegetables.  Education: All About Nutrition: -Group  instruction provided by verbal, written material, interactive activities, discussions, models, and posters to present general guidelines for heart healthy nutrition including fat, fiber, MyPlate, the role of sodium in heart healthy nutrition, utilization of the nutrition label, and utilization of this knowledge for meal planning. Follow up email sent as well. Written material given at graduation. Flowsheet Row Cardiac Rehab from 08/03/2021 in [facility] Cardiac and Pulmonary Rehab  Date 07/27/21  Educator [facility]  Instruction Review Code 1- Verbalizes Understanding       Biometrics:  Pre Biometrics - 07/12/21 1017       Pre Biometrics   Height 5\' 11"  (1.803 m)    Weight 199 lb 6.4 oz (90.4 kg)    BMI (Calculated) 27.82    Single Leg Stand 3.8 seconds              Nutrition Therapy Plan and Nutrition Goals:   Nutrition Assessments:  MEDIFICTS Score Key: ?70 Need to make dietary changes  40-70 Heart Healthy Diet ? 40 Therapeutic Level Cholesterol Diet  Flowsheet Row Cardiac Rehab from 07/12/2021 in [facility] Cardiac and Pulmonary Rehab  Picture Your Plate Total Score on Admission 78      Picture Your Plate Scores: <09 Unhealthy dietary pattern with much room for improvement. 41-50 Dietary pattern unlikely to meet recommendations for good health and room for improvement. 51-60 More healthful dietary pattern, with some room for improvement.  >60 Healthy dietary pattern, although there may be some specific behaviors that could be improved.    Nutrition Goals Re-Evaluation:  Nutrition Goals Re-Evaluation     [place] Name 07/22/21 1133             Goals   Current Weight 204 lb (92.5 kg)       Nutrition Goal Decrease red meat.       Comment He states that he eats red meat twice a week. Sometimes he eats to much and other times he doesnt eat enough. After his surgery his weight had went down to 195. He hope that his weight gain is from working out. Canned soups are something he feels like  he can cut back on to help his weight because he knows they have alot of sodium. Meet with the dietitian.       Expected Outcome Short: meet with the dietitian and cut back on canned soups. Long: maintain a diet that pertains to his needs.                Nutrition Goals Discharge (Final Nutrition Goals Re-Evaluation):  Nutrition Goals Re-Evaluation - 07/22/21 1133       Goals   Current Weight 204 lb (92.5 kg)    Nutrition Goal Decrease red meat.    Comment He states that he eats red meat twice a week. Sometimes he eats to much and other times he doesnt eat enough. After his surgery his weight had went down to 195. He hope that his weight gain is from working out. Canned soups are something he feels like he can cut back on to help his weight because he knows they have alot of sodium. Meet with the dietitian.    Expected Outcome Short: meet with the dietitian and cut back on canned soups. Long: maintain a diet that pertains to his needs.  Psychosocial: Target Goals: Acknowledge presence or absence of significant depression and/or stress, maximize coping skills, provide positive support system. Participant is able to verbalize types and ability to use techniques and skills needed for reducing stress and depression.   Education: Stress, Anxiety, and Depression - Group verbal and visual presentation to define topics covered.  Reviews how body is impacted by stress, anxiety, and depression.  Also discusses healthy ways to reduce stress and to treat/manage anxiety and depression.  Written material given at graduation.   Education: Sleep Hygiene -Provides group verbal and written instruction about how sleep can affect your health.  Define sleep hygiene, discuss sleep cycles and impact of sleep habits. Review good sleep hygiene tips.    Initial Review & Psychosocial Screening:  Initial Psych Review & Screening - 06/29/21 1411       Initial Review   Current issues with None  Identified      Family Dynamics   Good Support System? Yes   wife   Concerns Inappropriate over/under dependence on family/friends      Barriers   Psychosocial barriers to participate in program There are no identifiable barriers or psychosocial needs.;The patient should benefit from training in stress management and relaxation.      Screening Interventions   Interventions Encouraged to exercise;To provide support and resources with identified psychosocial needs;Provide feedback about the scores to participant    Expected Outcomes Short Term goal: Identification and review with participant of any Quality of Life or Depression concerns found by scoring the questionnaire.;Short Term goal: Utilizing psychosocial counselor, staff and physician to assist with identification of specific Stressors or current issues interfering with healing process. Setting desired goal for each stressor or current issue identified.;Long Term Goal: Stressors or current issues are controlled or eliminated.;Long Term goal: The participant improves quality of Life and PHQ9 Scores as seen by post scores and/or verbalization of changes             Quality of Life Scores:   Quality of Life - 07/12/21 1020       Quality of Life   Select Quality of Life      Quality of Life Scores   Health/Function Pre 24.8 %    Socioeconomic Pre 30 %    Psych/Spiritual Pre 30 %    Family Pre 30 %    GLOBAL Pre 27.56 %            Scores of 19 and below usually indicate a poorer quality of life in these areas.  A difference of  2-3 points is a clinically meaningful difference.  A difference of 2-3 points in the total score of the Quality of Life Index has been associated with significant improvement in overall quality of life, self-image, physical symptoms, and general health in studies assessing change in quality of life.  PHQ-9: Recent Review Flowsheet Data     Depression screen [facility] 2/9 07/12/2021   Decreased Interest 2    Down, Depressed, Hopeless 2   PHQ - 2 Score 4   Altered sleeping 1   Tired, decreased energy 3   Change in appetite 2   Feeling bad or failure about yourself  2   Trouble concentrating 1   Moving slowly or fidgety/restless 2   Suicidal thoughts 0   PHQ-9 Score 15   Difficult doing work/chores Not difficult at all      Interpretation of Total Score  Total Score Depression Severity:  1-4 = Minimal depression, 5-9 = Mild  depression, 10-14 = Moderate depression, 15-19 = Moderately severe depression, 20-27 = Severe depression   Psychosocial Evaluation and Intervention:  Psychosocial Evaluation - 06/29/21 1417       Psychosocial Evaluation & Interventions   Interventions Encouraged to exercise with the program and follow exercise prescription    Comments Mr. Derego reports doing well post CABG x 2. He states in the first few weeks there was some anxiety and maybe even some depression related to his surgery and recovery, but he said the last two weeks he has been feeling a lot better. His wife, who is a Marine scientist, has been very supportive and helped keep him on track. He has been walking three days a week and reports feeling well after. They are looking forward to starting cardiac rehab for education and exercise guidelines    Expected Outcomes Short: attend cardiac rehab for education and exercise. Long: develop and maintain positive self care habits.    Continue Psychosocial Services  Follow up required by staff             Psychosocial Re-Evaluation:  Psychosocial Re-Evaluation     [facility] Name 07/22/21 1139             Psychosocial Re-Evaluation   Current issues with Current Sleep Concerns;Current Depression       Comments Art takes a sleeping pill at night to help him get to sleep. He wants to come off of it. He states that his heart procedure is giving him some depression. Informed patient that if he feels like his depression is getting worse to let us and his doctor know.        Expected Outcomes Short: Continue to exercise regularly to support mental health and notify staff of any changes. Long: maintain mental health and well being through teaching of rehab or prescribed medications independently.       Interventions Encouraged to attend Cardiac Rehabilitation for the exercise;Stress management education;Relaxation education       Continue Psychosocial Services  Follow up required by staff                Psychosocial Discharge (Final Psychosocial Re-Evaluation):  Psychosocial Re-Evaluation - 07/22/21 1139       Psychosocial Re-Evaluation   Current issues with Current Sleep Concerns;Current Depression    Comments Art takes a sleeping pill at night to help him get to sleep. He wants to come off of it. He states that his heart procedure is giving him some depression. Informed patient that if he feels like his depression is getting worse to let us and his doctor know.    Expected Outcomes Short: Continue to exercise regularly to support mental health and notify staff of any changes. Long: maintain mental health and well being through teaching of rehab or prescribed medications independently.    Interventions Encouraged to attend Cardiac Rehabilitation for the exercise;Stress management education;Relaxation education    Continue Psychosocial Services  Follow up required by staff             Vocational Rehabilitation: Provide vocational rehab assistance to qualifying candidates.   Vocational Rehab Evaluation & Intervention:  Vocational Rehab - 06/29/21 1407       Initial Vocational Rehab Evaluation & Intervention   Assessment shows need for Vocational Rehabilitation No             Education: Education Goals: Education classes will be provided on a variety of topics geared toward better understanding of heart health and risk factor modification.  Participant will state understanding/return demonstration of topics presented as noted by education test  scores.  Learning Barriers/Preferences:  Learning Barriers/Preferences - 06/29/21 1419       Learning Barriers/Preferences   Learning Barriers None    Learning Preferences None             General Cardiac Education Topics:  AED/CPR: - Group verbal and written instruction with the use of models to demonstrate the basic use of the AED with the basic ABC's of resuscitation.   Anatomy and Cardiac Procedures: - Group verbal and visual presentation and models provide information about basic cardiac anatomy and function. Reviews the testing methods done to diagnose heart disease and the outcomes of the test results. Describes the treatment choices: Medical Management, Angioplasty, or Coronary Bypass Surgery for treating various heart conditions including Myocardial Infarction, Angina, Valve Disease, and Cardiac Arrhythmias.  Written material given at graduation.   Medication Safety: - Group verbal and visual instruction to review commonly prescribed medications for heart and lung disease. Reviews the medication, class of the drug, and side effects. Includes the steps to properly store meds and maintain the prescription regimen.  Written material given at graduation. Flowsheet Row Cardiac Rehab from 08/03/2021 in [facility] Cardiac and Pulmonary Rehab  Date 07/20/21  Educator SB  Instruction Review Code 1- Verbalizes Understanding       Intimacy: - Group verbal instruction through game format to discuss how heart and lung disease can affect sexual intimacy. Written material given at graduation..   Know Your Numbers and Heart Failure: - Group verbal and visual instruction to discuss disease risk factors for cardiac and pulmonary disease and treatment options.  Reviews associated critical values for Overweight/Obesity, Hypertension, Cholesterol, and Diabetes.  Discusses basics of heart failure: signs/symptoms and treatments.  Introduces Heart Failure Zone chart for action plan for heart  failure.  Written material given at graduation. Flowsheet Row Cardiac Rehab from 08/03/2021 in [facility] Cardiac and Pulmonary Rehab  Education need identified 07/12/21  Date 08/03/21  Educator KB  Instruction Review Code 1- Verbalizes Understanding       Infection Prevention: - Provides verbal and written material to individual with discussion of infection control including proper hand washing and proper equipment cleaning during exercise session. Flowsheet Row Cardiac Rehab from 08/03/2021 in [facility] Cardiac and Pulmonary Rehab  Date 07/12/21  Educator [facility]  Instruction Review Code 1- Verbalizes Understanding       Falls Prevention: - Provides verbal and written material to individual with discussion of falls prevention and safety. Flowsheet Row Cardiac Rehab from 08/03/2021 in [facility] Cardiac and Pulmonary Rehab  Date 07/12/21  Educator [facility]  Instruction Review Code 1- Verbalizes Understanding       Other: -Provides group and verbal instruction on various topics (see comments)   Knowledge Questionnaire Score:  Knowledge Questionnaire Score - 07/12/21 1020       Knowledge Questionnaire Score   Pre Score 21/26             Core Components/Risk Factors/Patient Goals at Admission:  Personal Goals and Risk Factors at Admission - 07/12/21 1021       Core Components/Risk Factors/Patient Goals on Admission    Weight Management Yes;Weight Loss    Intervention Weight Management: Develop a combined nutrition and exercise program designed to reach desired caloric intake, while maintaining appropriate intake of nutrient and fiber, sodium and fats, and appropriate energy expenditure required for the weight goal.;Weight Management: Provide education and appropriate resources to help participant work on  and attain dietary goals.;Weight Management/Obesity: Establish reasonable short term and long term weight goals.    Admit Weight 199 lb 6.4 oz (90.4 kg)    Goal Weight: Short Term 194 lb (88  kg)    Goal Weight: Long Term 190 lb (86.2 kg)    Expected Outcomes Short Term: Continue to assess and modify interventions until short term weight is achieved;Long Term: Adherence to nutrition and physical activity/exercise program aimed toward attainment of established weight goal;Weight Loss: Understanding of general recommendations for a balanced deficit meal plan, which promotes 1-2 lb weight loss per week and includes a negative energy balance of [phone] kcal/d;Understanding recommendations for meals to include 15-35% energy as protein, 25-35% energy from fat, 35-60% energy from carbohydrates, less than 200mg  of dietary cholesterol, 20-35 gm of total fiber daily;Understanding of distribution of calorie intake throughout the day with the consumption of 4-5 meals/snacks    Heart Failure Yes    Intervention Provide a combined exercise and nutrition program that is supplemented with education, support and counseling about heart failure. Directed toward relieving symptoms such as shortness of breath, decreased exercise tolerance, and extremity edema.    Expected Outcomes Improve functional capacity of life;Short term: Attendance in program 2-3 days a week with increased exercise capacity. Reported lower sodium intake. Reported increased fruit and vegetable intake. Reports medication compliance.;Short term: Daily weights obtained and reported for increase. Utilizing diuretic protocols set by physician.;Long term: Adoption of self-care skills and reduction of barriers for early signs and symptoms recognition and intervention leading to self-care maintenance.    Hypertension Yes    Intervention Provide education on lifestyle modifcations including regular physical activity/exercise, weight management, moderate sodium restriction and increased consumption of fresh fruit, vegetables, and low fat dairy, alcohol moderation, and smoking cessation.;Monitor prescription use compliance.    Expected Outcomes Short  Term: Continued assessment and intervention until BP is < 140/51mm HG in hypertensive participants. < 130/50mm HG in hypertensive participants with diabetes, heart failure or chronic kidney disease.;Long Term: Maintenance of blood pressure at goal levels.    Lipids Yes    Intervention Provide education and support for participant on nutrition & aerobic/resistive exercise along with prescribed medications to achieve LDL 70mg , HDL >40mg .    Expected Outcomes Short Term: Participant states understanding of desired cholesterol values and is compliant with medications prescribed. Participant is following exercise prescription and nutrition guidelines.;Long Term: Cholesterol controlled with medications as prescribed, with individualized exercise RX and with personalized nutrition plan. Value goals: LDL < 70mg , HDL > 40 mg.             Education:Diabetes - Individual verbal and written instruction to review signs/symptoms of diabetes, desired ranges of glucose level fasting, after meals and with exercise. Acknowledge that pre and post exercise glucose checks will be done for 3 sessions at entry of program. [place] from 08/03/2021 in [facility] Cardiac and Pulmonary Rehab  Date 06/29/21  Educator [facility]  Instruction Review Code 1- Verbalizes Understanding       Core Components/Risk Factors/Patient Goals Review:   Goals and Risk Factor Review     Row Name 07/22/21 1126             Core Components/Risk Factors/Patient Goals Review   Personal Goals Review Hypertension;Diabetes       Review Art has been checking his blood pressure at home ecery other day and his hreading are doing well. He states he is 100-110s over 60s on average. Art has spoke to his  doctor about his diabetes and is checking his A1C every three months. He is watching what he eats but he states he is hungy alot. His provider states that his A1C should be below 8. He takes metforamin in the morning and at night. He goes  by how he feels and knows how his sugars are stable.       Expected Outcomes Short: maintain blood pressure reading in class and watch diet for his diabetes. Long: keep brining down A1C.                Core Components/Risk Factors/Patient Goals at Discharge (Final Review):   Goals and Risk Factor Review - 07/22/21 1126       Core Components/Risk Factors/Patient Goals Review   Personal Goals Review Hypertension;Diabetes    Review Art has been checking his blood pressure at home ecery other day and his hreading are doing well. He states he is 100-110s over 60s on average. Art has spoke to his doctor about his diabetes and is checking his A1C every three months. He is watching what he eats but he states he is hungy alot. His provider states that his A1C should be below 8. He takes metforamin in the morning and at night. He goes by how he feels and knows how his sugars are stable.    Expected Outcomes Short: maintain blood pressure reading in class and watch diet for his diabetes. Long: keep brining down A1C.             ITP Comments:  ITP Comments     Row Name 06/29/21 1437 07/12/21 1013 07/13/21 0647 07/15/21 1108 08/10/21 0946   ITP Comments Initial telephone orientation completed. Diagnosis can be found in [facility] 7/9. EP orientation scheduled Tuesday 9/6 at 8am. Completed 6MWT and gym orientation. Initial ITP created and sent for review to Dr. Emily Filbert, Medical Director. 30 Day review completed. Medical Director ITP review done, changes made as directed, and signed approval by Medical Director. First full day of exercise!  Patient was oriented to gym and equipment including functions, settings, policies, and procedures.  Patient's individual exercise prescription and treatment plan were reviewed.  All starting workloads were established based on the results of the 6 minute walk test done at initial orientation visit.  The plan for exercise progression was also introduced and progression  will be customized based on patient's performance and goals. 30 day review completed. ITP sent to Dr. Emily Filbert, Medical Director of Cardiac Rehab. Continue with ITP unless changes are made by physician.            Comments: 30 day review

## 2021-08-12 ENCOUNTER — Encounter: Payer: No Typology Code available for payment source | Admitting: *Deleted

## 2021-08-12 ENCOUNTER — Other Ambulatory Visit: Payer: Self-pay

## 2021-08-12 DIAGNOSIS — Z48812 Encounter for surgical aftercare following surgery on the circulatory system: Secondary | ICD-10-CM | POA: Diagnosis not present

## 2021-08-12 DIAGNOSIS — Z951 Presence of aortocoronary bypass graft: Secondary | ICD-10-CM

## 2021-08-12 NOTE — Progress Notes (Signed)
Daily Session Note  Patient Details  Name: Ralph Marsh MRN: [id number] Date of Birth: 08-06-1946 Referring Provider:   Flowsheet Row Cardiac Rehab from 07/12/2021 in [facility] Cardiac and Pulmonary Rehab  Referring Provider Brett Canales MD ([place])       Encounter Date: 08/12/2021  Check In:  Session Check In - 08/12/21 1123       Check-In   Supervising physician immediately available to respond to emergencies See telemetry face sheet for immediately available ER MD    Location [facility]-Cardiac & Pulmonary Rehab    Staff Present Renita Papa, RN BSN;Joseph Roma, RCP,RRT,BSRT;Jessica [place], [place], RCEP, CCRP, CCET    Virtual Visit No    Medication changes reported     No    Fall or balance concerns reported    No    Warm-up and Cool-down Performed on first and last piece of equipment    Resistance Training Performed Yes    VAD Patient? No    PAD/SET Patient? No      Pain Assessment   Currently in Pain? No/denies                Social History   Tobacco Use  Smoking Status Former   Packs/day: 1.00   Years: 15.00   Pack years: 15.00   Types: Cigarettes   Quit date: 1995   Years since quitting: 27.7  Smokeless Tobacco Never    Goals Met:  Independence with exercise equipment Exercise tolerated well No report of concerns or symptoms today Strength training completed today  Goals Unmet:  Not Applicable  Comments: Pt able to follow exercise prescription today without complaint.  Will continue to monitor for progression.  Reviewed home exercise with pt today.  Pt plans to walk for exercise.  Reviewed THR, pulse, RPE, sign and symptoms, pulse oximetery and when to call 911 or MD.  Also discussed weather considerations and indoor options.  Pt voiced understanding.   Dr. Emily Filbert is Medical Director for [place].  Dr. Ottie Glazier is Medical Director for [facility] Pulmonary Rehabilitation.

## 2021-08-15 ENCOUNTER — Other Ambulatory Visit: Payer: Self-pay

## 2021-08-15 DIAGNOSIS — Z48812 Encounter for surgical aftercare following surgery on the circulatory system: Secondary | ICD-10-CM | POA: Diagnosis not present

## 2021-08-15 DIAGNOSIS — Z951 Presence of aortocoronary bypass graft: Secondary | ICD-10-CM

## 2021-08-15 NOTE — Progress Notes (Signed)
Completed initial RD evaluation 

## 2021-08-15 NOTE — Progress Notes (Signed)
Daily Session Note  Patient Details  Name: Ralph Marsh MRN: [id number] Date of Birth: January 01, 1946 Referring Provider:   Flowsheet Row Cardiac Rehab from 07/12/2021 in [facility] Cardiac and Pulmonary Rehab  Referring Provider Brett Canales MD ([place])       Encounter Date: 08/15/2021  Check In:  Session Check In - 08/15/21 1114       Check-In   Supervising physician immediately available to respond to emergencies See telemetry face sheet for immediately available ER MD    Location [facility]-Cardiac & Pulmonary Rehab    Staff Present Birdie Sons, MPA, Mauricia Area, BS, ACSM CEP, Exercise Physiologist;Amanda Oletta Darter, BA, ACSM CEP, Exercise Physiologist;Zarrah [place], RN,BC,MSN    Virtual Visit No    Medication changes reported     No    Fall or balance concerns reported    No    Warm-up and Cool-down Performed on first and last piece of equipment    Resistance Training Performed Yes    VAD Patient? No    PAD/SET Patient? No      Pain Assessment   Currently in Pain? No/denies                Social History   Tobacco Use  Smoking Status Former   Packs/day: 1.00   Years: 15.00   Pack years: 15.00   Types: Cigarettes   Quit date: 1995   Years since quitting: 27.7  Smokeless Tobacco Never    Goals Met:  Independence with exercise equipment Exercise tolerated well No report of concerns or symptoms today Strength training completed today  Goals Unmet:  Not Applicable  Comments: Pt able to follow exercise prescription today without complaint.  Will continue to monitor for progression.    Dr. Emily Filbert is Medical Director for [place].  Dr. Ottie Glazier is Medical Director for [facility] Pulmonary Rehabilitation.

## 2021-08-17 ENCOUNTER — Other Ambulatory Visit: Payer: Self-pay

## 2021-08-17 DIAGNOSIS — Z951 Presence of aortocoronary bypass graft: Secondary | ICD-10-CM

## 2021-08-17 DIAGNOSIS — Z48812 Encounter for surgical aftercare following surgery on the circulatory system: Secondary | ICD-10-CM | POA: Diagnosis not present

## 2021-08-17 NOTE — Progress Notes (Signed)
Daily Session Note  Patient Details  Name: Ralph Marsh MRN: [id number] Date of Birth: [birth year] Referring Provider:   Flowsheet Row Cardiac Rehab from 07/12/2021 in [facility] Cardiac and Pulmonary Rehab  Referring Provider Brett Canales MD ([place])       Encounter Date: 08/17/2021  Check In:  Session Check In - 08/17/21 1104       Check-In   Supervising physician immediately available to respond to emergencies See telemetry face sheet for immediately available ER MD    Location [facility]-Cardiac & Pulmonary Rehab    Staff Present Birdie Sons, MPA, Elveria Rising, BA, ACSM CEP, Exercise Physiologist;Joseph Tessie Fass, Cristopher Estimable, RN BSN    Virtual Visit No    Medication changes reported     No    Fall or balance concerns reported    No    Tobacco Cessation No Change    Warm-up and Cool-down Performed on first and last piece of equipment    Resistance Training Performed Yes    VAD Patient? No    PAD/SET Patient? No      Pain Assessment   Currently in Pain? No/denies                Social History   Tobacco Use  Smoking Status Former   Packs/day: 1.00   Years: 15.00   Pack years: 15.00   Types: Cigarettes   Quit date: 1995   Years since quitting: 27.7  Smokeless Tobacco Never    Goals Met:  Independence with exercise equipment Exercise tolerated well No report of concerns or symptoms today Strength training completed today  Goals Unmet:  Not Applicable  Comments: Pt able to follow exercise prescription today without complaint.  Will continue to monitor for progression.    Dr. Emily Filbert is Medical Director for [place].  Dr. Ottie Glazier is Medical Director for [facility] Pulmonary Rehabilitation.

## 2021-08-19 ENCOUNTER — Other Ambulatory Visit: Payer: Self-pay

## 2021-08-19 ENCOUNTER — Encounter: Payer: No Typology Code available for payment source | Admitting: *Deleted

## 2021-08-19 DIAGNOSIS — Z48812 Encounter for surgical aftercare following surgery on the circulatory system: Secondary | ICD-10-CM | POA: Diagnosis not present

## 2021-08-19 DIAGNOSIS — Z951 Presence of aortocoronary bypass graft: Secondary | ICD-10-CM

## 2021-08-19 NOTE — Progress Notes (Signed)
Daily Session Note  Patient Details  Name: Ralph Marsh MRN: [id number] Date of Birth: [birth year] Referring Provider:   Flowsheet Row Cardiac Rehab from 07/12/2021 in [facility] Cardiac and Pulmonary Rehab  Referring Provider Brett Canales MD ([place])       Encounter Date: 08/19/2021  Check In:  Session Check In - 08/19/21 1104       Check-In   Supervising physician immediately available to respond to emergencies See telemetry face sheet for immediately available ER MD    Location [facility]-Cardiac & Pulmonary Rehab    Staff Present Renita Papa, RN BSN;Joseph [place], RCP,RRT,BSRT;Jessica [place], [place], RCEP, CCRP, CCET    Virtual Visit No    Medication changes reported     No    Fall or balance concerns reported    No    Warm-up and Cool-down Performed on first and last piece of equipment    Resistance Training Performed Yes    VAD Patient? No    PAD/SET Patient? No      Pain Assessment   Currently in Pain? No/denies                Social History   Tobacco Use  Smoking Status Former   Packs/day: 1.00   Years: 15.00   Pack years: 15.00   Types: Cigarettes   Quit date: 1995   Years since quitting: 27.8  Smokeless Tobacco Never    Goals Met:  Independence with exercise equipment Exercise tolerated well No report of concerns or symptoms today Strength training completed today  Goals Unmet:  Not Applicable  Comments: Pt able to follow exercise prescription today without complaint.  Will continue to monitor for progression.    Dr. Emily Filbert is Medical Director for [place].  Dr. Ottie Glazier is Medical Director for [facility] Pulmonary Rehabilitation.

## 2021-08-22 ENCOUNTER — Other Ambulatory Visit: Payer: Self-pay

## 2021-08-22 ENCOUNTER — Encounter: Payer: No Typology Code available for payment source | Admitting: *Deleted

## 2021-08-22 DIAGNOSIS — Z48812 Encounter for surgical aftercare following surgery on the circulatory system: Secondary | ICD-10-CM | POA: Diagnosis not present

## 2021-08-22 DIAGNOSIS — Z951 Presence of aortocoronary bypass graft: Secondary | ICD-10-CM

## 2021-08-22 NOTE — Progress Notes (Signed)
Daily Session Note  Patient Details  Name: Ralph Marsh MRN: [id number] Date of Birth: October 06, 1946 Referring Provider:   Flowsheet Row Cardiac Rehab from 07/12/2021 in [facility] Cardiac and Pulmonary Rehab  Referring Provider Brett Canales MD ([place])       Encounter Date: 08/22/2021  Check In:  Session Check In - 08/22/21 1059       Check-In   Supervising physician immediately available to respond to emergencies See telemetry face sheet for immediately available ER MD    Location [facility]-Cardiac & Pulmonary Rehab    Staff Present Renita Papa, RN Moises Blood, BS, ACSM CEP, Exercise Physiologist;Amanda Oletta Darter, BA, ACSM CEP, Exercise Physiologist;Kelly Rosalia Hammers, MPA, RN    Virtual Visit No    Medication changes reported     No    Fall or balance concerns reported    No    Warm-up and Cool-down Performed on first and last piece of equipment    Resistance Training Performed Yes    VAD Patient? No    PAD/SET Patient? No      Pain Assessment   Currently in Pain? No/denies                Social History   Tobacco Use  Smoking Status Former   Packs/day: 1.00   Years: 15.00   Pack years: 15.00   Types: Cigarettes   Quit date: 1995   Years since quitting: 27.8  Smokeless Tobacco Never    Goals Met:  Independence with exercise equipment Exercise tolerated well No report of concerns or symptoms today Strength training completed today  Goals Unmet:  Not Applicable  Comments: Pt able to follow exercise prescription today without complaint.  Will continue to monitor for progression.    Dr. Emily Filbert is Medical Director for [place].  Dr. Ottie Glazier is Medical Director for [facility] Pulmonary Rehabilitation.

## 2021-08-24 ENCOUNTER — Other Ambulatory Visit: Payer: Self-pay

## 2021-08-24 DIAGNOSIS — Z951 Presence of aortocoronary bypass graft: Secondary | ICD-10-CM

## 2021-08-24 DIAGNOSIS — Z48812 Encounter for surgical aftercare following surgery on the circulatory system: Secondary | ICD-10-CM | POA: Diagnosis not present

## 2021-08-24 NOTE — Progress Notes (Signed)
Daily Session Note  Patient Details  Name: Ralph Marsh MRN: [id number] Date of Birth: [birth year] Referring Provider:   Flowsheet Row Cardiac Rehab from 07/12/2021 in [facility] Cardiac and Pulmonary Rehab  Referring Provider Brett Canales MD ([place])       Encounter Date: 08/24/2021  Check In:  Session Check In - 08/24/21 1107       Check-In   Supervising physician immediately available to respond to emergencies See telemetry face sheet for immediately available ER MD    Location [facility]-Cardiac & Pulmonary Rehab    Staff Present Birdie Sons, MPA, RN;Melissa Brandon, RDN, LDN;Joseph Tessie Fass, Cristopher Estimable, RN BSN    Virtual Visit No    Medication changes reported     No    Fall or balance concerns reported    No    Tobacco Cessation No Change    Warm-up and Cool-down Performed on first and last piece of equipment    Resistance Training Performed Yes    VAD Patient? No    PAD/SET Patient? No      Pain Assessment   Currently in Pain? No/denies                Social History   Tobacco Use  Smoking Status Former   Packs/day: 1.00   Years: 15.00   Pack years: 15.00   Types: Cigarettes   Quit date: 1995   Years since quitting: 27.8  Smokeless Tobacco Never    Goals Met:  Independence with exercise equipment Exercise tolerated well No report of concerns or symptoms today Strength training completed today  Goals Unmet:  Not Applicable  Comments: Pt able to follow exercise prescription today without complaint.  Will continue to monitor for progression.    Dr. Emily Filbert is Medical Director for [place].  Dr. Ottie Glazier is Medical Director for [facility] Pulmonary Rehabilitation.

## 2021-08-26 ENCOUNTER — Encounter: Payer: No Typology Code available for payment source | Admitting: *Deleted

## 2021-08-26 ENCOUNTER — Other Ambulatory Visit: Payer: Self-pay

## 2021-08-26 DIAGNOSIS — Z48812 Encounter for surgical aftercare following surgery on the circulatory system: Secondary | ICD-10-CM | POA: Diagnosis not present

## 2021-08-26 DIAGNOSIS — Z951 Presence of aortocoronary bypass graft: Secondary | ICD-10-CM

## 2021-08-26 NOTE — Progress Notes (Signed)
Daily Session Note  Patient Details  Name: Ralph Marsh MRN: [id number] Date of Birth: [birth year] Referring Provider:   Flowsheet Row Cardiac Rehab from 07/12/2021 in [facility] Cardiac and Pulmonary Rehab  Referring Provider Brett Canales MD ([place])       Encounter Date: 08/26/2021  Check In:  Session Check In - 08/26/21 1113       Check-In   Supervising physician immediately available to respond to emergencies See telemetry face sheet for immediately available ER MD    Location [facility]-Cardiac & Pulmonary Rehab    Staff Present Nyoka Cowden, RN, BSN, Ardeth Sportsman, RDN, LDN;Susanne Bice, RN, BSN, CCRP    Virtual Visit No    Medication changes reported     No    Fall or balance concerns reported    No    Tobacco Cessation No Change    Warm-up and Cool-down Performed on first and last piece of equipment    Resistance Training Performed Yes    VAD Patient? No    PAD/SET Patient? No      Pain Assessment   Currently in Pain? No/denies                Social History   Tobacco Use  Smoking Status Former   Packs/day: 1.00   Years: 15.00   Pack years: 15.00   Types: Cigarettes   Quit date: 1995   Years since quitting: 27.8  Smokeless Tobacco Never    Goals Met:  Independence with exercise equipment Exercise tolerated well No report of concerns or symptoms today  Goals Unmet:  Not Applicable  Comments: Pt able to follow exercise prescription today without complaint.  Will continue to monitor for progression.    Dr. Emily Filbert is Medical Director for [place].  Dr. Ottie Glazier is Medical Director for [facility] Pulmonary Rehabilitation.

## 2021-08-29 ENCOUNTER — Encounter: Payer: No Typology Code available for payment source | Admitting: *Deleted

## 2021-08-29 ENCOUNTER — Other Ambulatory Visit: Payer: Self-pay

## 2021-08-29 DIAGNOSIS — Z48812 Encounter for surgical aftercare following surgery on the circulatory system: Secondary | ICD-10-CM | POA: Diagnosis not present

## 2021-08-29 DIAGNOSIS — Z951 Presence of aortocoronary bypass graft: Secondary | ICD-10-CM

## 2021-08-29 NOTE — Progress Notes (Signed)
Daily Session Note  Patient Details  Name: Ralph Marsh MRN: [id number] Date of Birth: 03-19-1946 Referring Provider:   Flowsheet Row Cardiac Rehab from 07/12/2021 in [facility] Cardiac and Pulmonary Rehab  Referring Provider Brett Canales MD ([place])       Encounter Date: 08/29/2021  Check In:  Session Check In - 08/29/21 1116       Check-In   Supervising physician immediately available to respond to emergencies See telemetry face sheet for immediately available ER MD    Location [facility]-Cardiac & Pulmonary Rehab    Staff Present Renita Papa, RN Moises Blood, BS, ACSM CEP, Exercise Physiologist;Amanda Oletta Darter, BA, ACSM CEP, Exercise Physiologist;Kelly Rosalia Hammers, MPA, RN    Virtual Visit No    Medication changes reported     No    Fall or balance concerns reported    No    Warm-up and Cool-down Performed on first and last piece of equipment    Resistance Training Performed Yes    VAD Patient? No    PAD/SET Patient? No      Pain Assessment   Currently in Pain? No/denies                Social History   Tobacco Use  Smoking Status Former   Packs/day: 1.00   Years: 15.00   Pack years: 15.00   Types: Cigarettes   Quit date: 1995   Years since quitting: 27.8  Smokeless Tobacco Never    Goals Met:  Independence with exercise equipment Exercise tolerated well No report of concerns or symptoms today Strength training completed today  Goals Unmet:  Not Applicable  Comments: Pt able to follow exercise prescription today without complaint.  Will continue to monitor for progression.    Dr. Emily Filbert is Medical Director for [place].  Dr. Ottie Glazier is Medical Director for [facility] Pulmonary Rehabilitation.

## 2021-08-31 ENCOUNTER — Other Ambulatory Visit: Payer: Self-pay

## 2021-08-31 DIAGNOSIS — Z48812 Encounter for surgical aftercare following surgery on the circulatory system: Secondary | ICD-10-CM | POA: Diagnosis not present

## 2021-08-31 DIAGNOSIS — Z951 Presence of aortocoronary bypass graft: Secondary | ICD-10-CM

## 2021-08-31 NOTE — Progress Notes (Signed)
Daily Session Note  Patient Details  Name: Ralph Marsh MRN: [id number] Date of Birth: 1946-10-03 Referring Provider:   Flowsheet Row Cardiac Rehab from 07/12/2021 in [facility] Cardiac and Pulmonary Rehab  Referring Provider Brett Canales MD ([place])       Encounter Date: 08/31/2021  Check In:  Session Check In - 08/31/21 1046       Check-In   Supervising physician immediately available to respond to emergencies See telemetry face sheet for immediately available ER MD    Location [facility]-Cardiac & Pulmonary Rehab    Staff Present Birdie Sons, MPA, [place] Rising, BA, ACSM CEP, Exercise Physiologist;Joseph Tessie Fass, [place]    Virtual Visit No    Medication changes reported     No    Fall or balance concerns reported    No    Tobacco Cessation No Change    Warm-up and Cool-down Performed on first and last piece of equipment    Resistance Training Performed Yes    VAD Patient? No    PAD/SET Patient? No      Pain Assessment   Currently in Pain? No/denies                Social History   Tobacco Use  Smoking Status Former   Packs/day: 1.00   Years: 15.00   Pack years: 15.00   Types: Cigarettes   Quit date: 1995   Years since quitting: 27.8  Smokeless Tobacco Never    Goals Met:  Independence with exercise equipment Exercise tolerated well No report of concerns or symptoms today Strength training completed today  Goals Unmet:  Not Applicable  Comments: Pt able to follow exercise prescription today without complaint.  Will continue to monitor for progression.    Dr. Emily Filbert is Medical Director for [place].  Dr. Ottie Glazier is Medical Director for [facility] Pulmonary Rehabilitation.

## 2021-09-06 IMAGING — CR DG CHEST 2V
2 series · 2 of 2 positions shown · non-contrast
Comparison: None.

CLINICAL DATA: Chest pain.

EXAM:
CHEST - 2 VIEW

[chest pa]
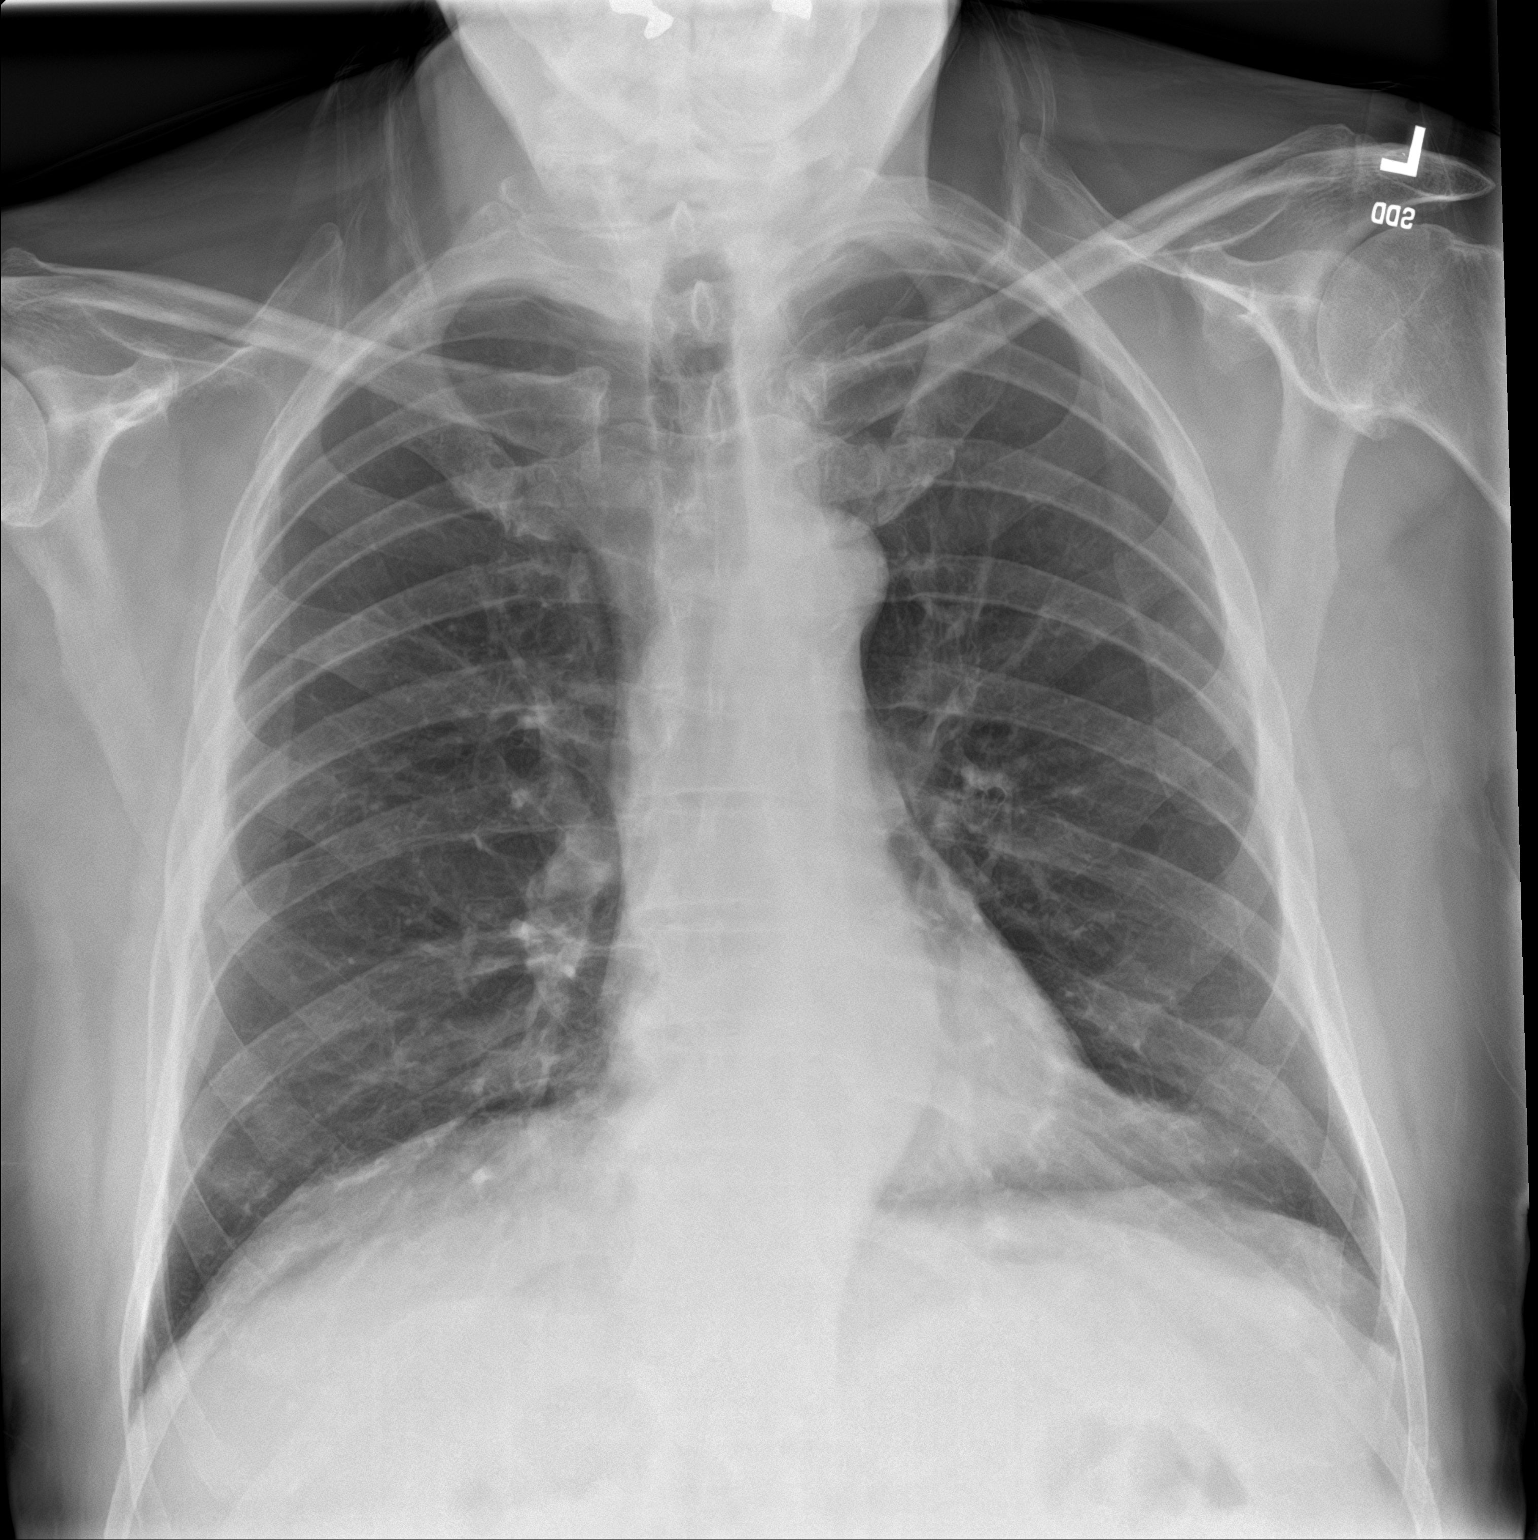

[chest lat]
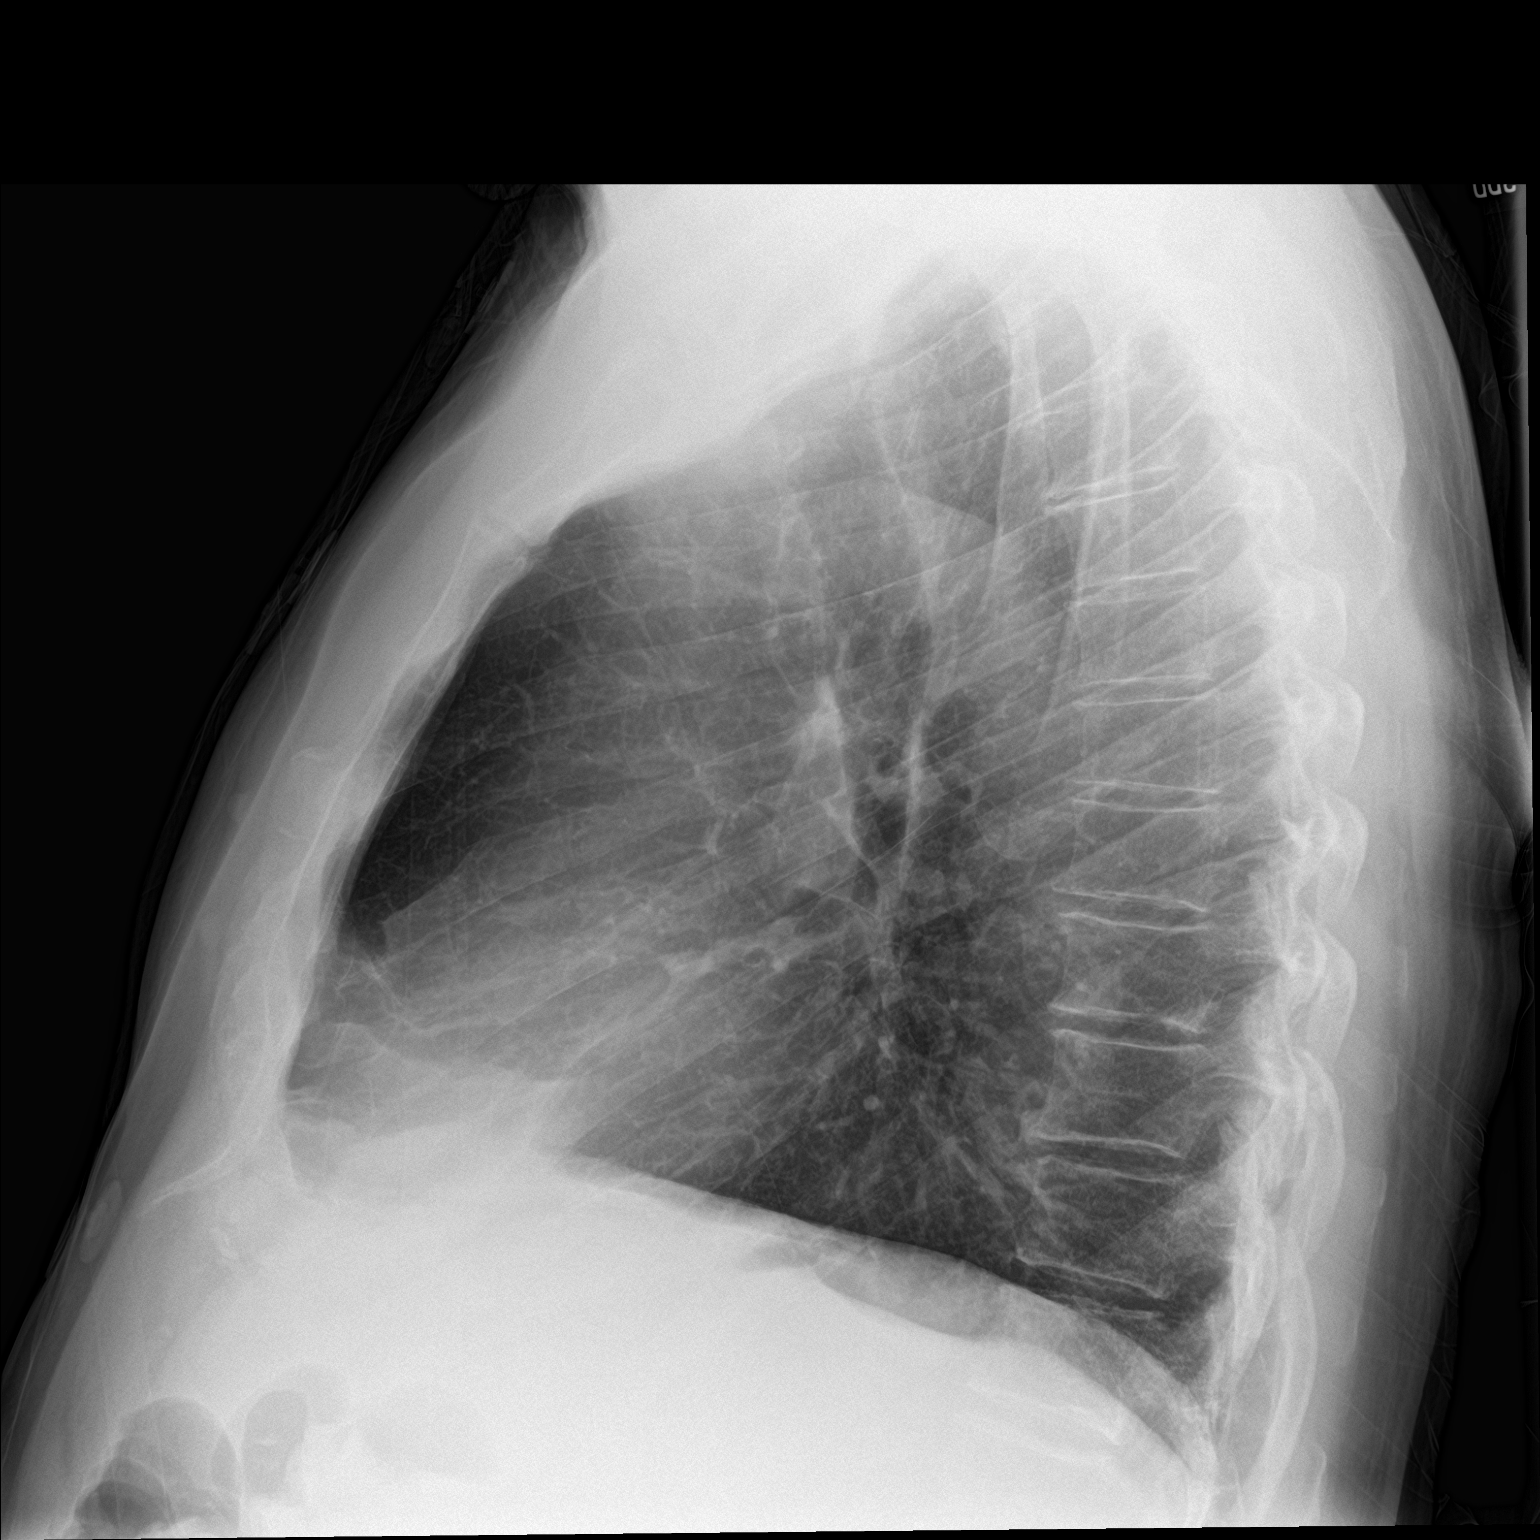

[2 of 2 positions shown; findings below may reference images not displayed]

FINDINGS: The heart size and mediastinal contours are within normal limits.
Atherosclerotic plaque.

No focal consolidation. No pulmonary edema. No pleural effusion. No
pneumothorax.

No acute osseous abnormality.  Shoulder degenerative changes.
IMPRESSION: No active cardiopulmonary disease.

## 2021-09-07 ENCOUNTER — Encounter: Payer: Self-pay | Admitting: *Deleted

## 2021-09-07 DIAGNOSIS — Z951 Presence of aortocoronary bypass graft: Secondary | ICD-10-CM

## 2021-09-07 NOTE — Progress Notes (Signed)
Cardiac Individual Treatment Plan  Patient Details  Name: Ralph Marsh MRN: [id number] Date of Birth: [birth year] Referring Provider:   Flowsheet Row Cardiac Rehab from 07/12/2021 in [facility] Cardiac and Pulmonary Rehab  Referring Provider Brett Canales MD ([place])       Initial Encounter Date:  Flowsheet Row Cardiac Rehab from 07/12/2021 in [facility] Cardiac and Pulmonary Rehab  Date 07/12/21       Visit Diagnosis: S/P CABG x 2  Patient's Home Medications on Admission:  Current Outpatient Medications:    allopurinol (ZYLOPRIM) 300 MG tablet, Take 1 tablet by mouth daily. (Patient not taking: Reported on 06/29/2021), Disp: , Rfl:    aspirin EC 81 MG tablet, Take 81 mg by mouth daily., Disp: , Rfl:    Cholecalciferol 1000 UNITS tablet, Take 1,000 Units by mouth 2 (two) times daily., Disp: , Rfl:    esomeprazole (NEXIUM) 40 MG capsule, Take 1 capsule by mouth daily., Disp: , Rfl:    Eszopiclone 3 MG TABS, , Disp: , Rfl:    heparin 25000 UT/250ML infusion, Inject 1,300 Units/hr into the vein continuous. (Patient not taking: Reported on 06/29/2021), Disp: , Rfl:    insulin detemir (LEVEMIR) 100 UNIT/ML injection, Inject 0.14 mLs (14 Units total) into the skin at bedtime. (Patient not taking: Reported on 06/29/2021), Disp: 10 mL, Rfl: 11   ketorolac (TORADOL) 10 MG tablet, Take 1 tablet (10 mg total) by mouth every 6 (six) hours as needed for moderate pain or severe pain. (Patient not taking: Reported on 06/29/2021), Disp: 10 tablet, Rfl: 0   lisinopril (ZESTRIL) 20 MG tablet, Take 10 mg by mouth daily. (Patient not taking: Reported on 06/29/2021), Disp: , Rfl:    metFORMIN (GLUCOPHAGE) 500 MG tablet, Take by mouth., Disp: , Rfl:    metoprolol tartrate (LOPRESSOR) 25 MG tablet, Take 0.5 tablets (12.5 mg total) by mouth 2 (two) times daily., Disp: , Rfl:    morphine (MS CONTIN) 30 MG 12 hr tablet, Take 30 mg by mouth 2 (two) times daily. (Patient not taking: Reported on 06/29/2021), Disp: , Rfl:    morphine  (MSIR) 15 MG tablet, Take 15 mg by mouth 3 (three) times daily as needed. (Patient not taking: Reported on 06/29/2021), Disp: , Rfl:    nitroGLYCERIN (NITROSTAT) 0.4 MG SL tablet, Place 1 tablet (0.4 mg total) under the tongue every 5 (five) minutes x 3 doses as needed for chest pain. (Patient not taking: Reported on 06/29/2021), Disp: , Rfl: 12  Past Medical History: Past Medical History:  Diagnosis Date   Arthritis    BPH (benign prostatic hyperplasia)    Diabetes mellitus, type 2 ([place])    DVT (deep venous thrombosis) ([place]) 05/12/2017   left upper leg   Emphysema of lung (HCC)    mild - on xray   Fatty liver    GERD (gastroesophageal reflux disease)    Hypertension    Kidney stone     Tobacco Use: Social History   Tobacco Use  Smoking Status Former   Packs/day: 1.00   Years: 15.00   Pack years: 15.00   Types: Cigarettes   Quit date: 1995   Years since quitting: 27.8  Smokeless Tobacco Never    Labs: Recent Review Management consultant for [facility] Cardiac and Pulmonary Rehab Latest Ref Rng & Units 05/15/2021   Cholestrol 0 - 200 mg/dL 141   LDLCALC 0 - 99 mg/dL 68   HDL >40 mg/dL 34(L)   Trlycerides <150 mg/dL  196(H)   Hemoglobin A1c 4.8 - 5.6 % 8.2(H)        Exercise Target Goals: Exercise Program Goal: Individual exercise prescription set using results from initial 6 min walk test and THRR while considering  patient's activity barriers and safety.   Exercise Prescription Goal: Initial exercise prescription builds to 30-45 minutes a day of aerobic activity, 2-3 days per week.  Home exercise guidelines will be given to patient during program as part of exercise prescription that the participant will acknowledge.   Education: Aerobic Exercise: - Group verbal and visual presentation on the components of exercise prescription. Introduces F.I.T.T principle from ACSM for exercise prescriptions.  Reviews F.I.T.T. principles of aerobic exercise including progression.  Written material given at graduation. Flowsheet Row Cardiac Rehab from 08/31/2021 in [facility] Cardiac and Pulmonary Rehab  Education need identified 07/12/21  Date 08/31/21  Educator [facility]  Instruction Review Code 1- Verbalizes Understanding       Education: Resistance Exercise: - Group verbal and visual presentation on the components of exercise prescription. Introduces F.I.T.T principle from ACSM for exercise prescriptions  Reviews F.I.T.T. principles of resistance exercise including progression. Written material given at graduation. Flowsheet Row Cardiac Rehab from 08/31/2021 in [facility] Cardiac and Pulmonary Rehab  Education need identified 07/12/21        Education: Exercise & Equipment Safety: - Individual verbal instruction and demonstration of equipment use and safety with use of the equipment. Flowsheet Row Cardiac Rehab from 08/31/2021 in [facility] Cardiac and Pulmonary Rehab  Date 07/12/21  Educator [facility]  Instruction Review Code 1- Verbalizes Understanding       Education: Exercise Physiology & General Exercise Guidelines: - Group verbal and written instruction with models to review the exercise physiology of the cardiovascular system and associated critical values. Provides general exercise guidelines with specific guidelines to those with heart or lung disease.  Flowsheet Row Cardiac Rehab from 08/31/2021 in [facility] Cardiac and Pulmonary Rehab  Education need identified 07/12/21  Date 08/24/21  Educator [facility]  Instruction Review Code 1- [employer] Understanding       Education: Flexibility, Balance, Mind/Body Relaxation: - Group verbal and visual presentation with interactive activity on the components of exercise prescription. Introduces F.I.T.T principle from ACSM for exercise prescriptions. Reviews F.I.T.T. principles of flexibility and balance exercise training including progression. Also discusses the mind body connection.  Reviews various relaxation techniques to help reduce and  manage stress (i.e. Deep breathing, progressive muscle relaxation, and visualization). Balance handout provided to take home. Written material given at graduation.   Activity Barriers & Risk Stratification:  Activity Barriers & Cardiac Risk Stratification - 07/12/21 1014       Activity Barriers & Cardiac Risk Stratification   Activity Barriers Incisional Pain;Joint Problems;Deconditioning;Muscular Weakness;Shortness of Breath;Decreased Ventricular Function;Balance Concerns;Other (comment)    Comments Swelling/edema in R leg from vein harvest    Cardiac Risk Stratification High             6 Minute Walk:  6 Minute Walk     Row Name 07/12/21 1013         6 Minute Walk   Phase Initial     Distance 890 feet     Walk Time 6 minutes     # of Rest Breaks 0     MPH 1.67     METS 2.09     RPE 11     Perceived Dyspnea  1     VO2 Peak 7.32     Symptoms Yes (comment)  Comments leg pain from swelling (2/10), full inflation feels tight in chest     Resting HR 84 bpm     Resting BP 132/66     Resting Oxygen Saturation  94 %     Exercise Oxygen Saturation  during 6 min walk 96 %     Max Ex. HR 115 bpm     Max Ex. BP 136/74     2 Minute Post BP 112/64              Oxygen Initial Assessment:   Oxygen Re-Evaluation:   Oxygen Discharge (Final Oxygen Re-Evaluation):   Initial Exercise Prescription:  Initial Exercise Prescription - 07/12/21 1000       Date of Initial Exercise RX and Referring Provider   Date 07/12/21    Referring Provider Brett Canales MD ([facility])      Treadmill   MPH 1.5    Grade 0.5    Minutes 15    METs 2.26      REL-XR   Level 1    Speed 50    Minutes 15    METs 1.5      T5 Nustep   Level 2    SPM 80    Minutes 15    METs 2      Track   Laps 23    Minutes 15    METs 2.25      Prescription Details   Frequency (times per week) 3    Duration Progress to 30 minutes of continuous aerobic without signs/symptoms of physical distress       Intensity   THRR 40-80% of Max Heartrate 108-131    Ratings of Perceived Exertion 11-13    Perceived Dyspnea 0-4      Progression   Progression Continue to progress workloads to maintain intensity without signs/symptoms of physical distress.      Resistance Training   Training Prescription Yes    Weight 3 lb    Reps 10-15             Perform Capillary Blood Glucose checks as needed.  Exercise Prescription Changes:   Exercise Prescription Changes     Row Name 07/12/21 1000 07/25/21 1300 08/08/21 0800 08/12/21 1100 08/22/21 1000     Response to Exercise   Blood Pressure (Admit) 132/66 120/62 108/60 -- 98/58   Blood Pressure (Exercise) 136/74 136/64 118/60 -- --   Blood Pressure (Exit) 112/64 108/62 104/64 -- 122/62   Heart Rate (Admit) 84 bpm 81 bpm 103 bpm -- 78 bpm   Heart Rate (Exercise) 115 bpm 114 bpm 113 bpm -- 114 bpm   Heart Rate (Exit) 91 bpm 90 bpm 111 bpm -- 84 bpm   Oxygen Saturation (Admit) 94 % -- -- -- --   Oxygen Saturation (Exercise) 96 % -- -- -- --   Rating of Perceived Exertion (Exercise) _0 -- 12   Perceived Dyspnea (Exercise) 1 -- -- -- --   Symptoms leg pain 2/10, SOB (tight breathing) none none -- none   Comments walk test results -- -- -- --   Duration -- Continue with 30 min of aerobic exercise without signs/symptoms of physical distress. Continue with 30 min of aerobic exercise without signs/symptoms of physical distress. -- Continue with 30 min of aerobic exercise without signs/symptoms of physical distress.   Intensity -- THRR unchanged THRR unchanged -- THRR unchanged     Progression   Progression -- Continue to progress workloads to maintain intensity  without signs/symptoms of physical distress. Continue to progress workloads to maintain intensity without signs/symptoms of physical distress. -- Continue to progress workloads to maintain intensity without signs/symptoms of physical distress.   Average METs -- 2.2 2.25 -- 2.43      Resistance Training   Training Prescription -- Yes Yes -- Yes   Weight -- 3 lb 3 lb -- 4 lb   Reps -- 10-15 10-15 -- 10-15     Interval Training   Interval Training -- No No -- No     REL-XR   Level -- 4 4 -- 7   Minutes -- 15 15 -- 15   METs -- 2.4 -- -- --     T5 Nustep   Level -- 4 -- -- 4   Minutes -- 15 -- -- 15   METs -- 2 -- -- 2.4     Track   Laps -- 22 21 -- 25   Minutes -- 15 15 -- 15   METs -- 2.2 2.14 -- 2.36     Home Exercise Plan   Plans to continue exercise at -- -- -- Home (comment)  walk Home (comment)  walk   Frequency -- -- -- Add 2 additional days to program exercise sessions. Add 2 additional days to program exercise sessions.   Initial Home Exercises Provided -- -- -- 08/12/21 08/12/21    Row Name 09/05/21 1600             Response to Exercise   Blood Pressure (Admit) 122/62       Blood Pressure (Exit) 112/68       Heart Rate (Admit) 71 bpm       Heart Rate (Exercise) 116 bpm       Heart Rate (Exit) 80 bpm       Rating of Perceived Exertion (Exercise) 11       Symptoms none       Duration Continue with 30 min of aerobic exercise without signs/symptoms of physical distress.       Intensity THRR unchanged         Progression   Progression Continue to progress workloads to maintain intensity without signs/symptoms of physical distress.       Average METs 2.6         Resistance Training   Training Prescription Yes       Weight 4 lb       Reps 10-15         Interval Training   Interval Training No         REL-XR   Level 5       Minutes 15         Track   Laps 15       Minutes 15       METs 1.82         Home Exercise Plan   Plans to continue exercise at Home (comment)  walk       Frequency Add 2 additional days to program exercise sessions.       Initial Home Exercises Provided 08/12/21                Exercise Comments:   Exercise Goals and Review:   Exercise Goals     Row Name 07/12/21 1017             Exercise  Goals   Increase Physical Activity Yes       Intervention Provide advice, education, support and  counseling about physical activity/exercise needs.;Develop an individualized exercise prescription for aerobic and resistive training based on initial evaluation findings, risk stratification, comorbidities and participant's personal goals.       Expected Outcomes Short Term: Attend rehab on a regular basis to increase amount of physical activity.;Long Term: Add in home exercise to make exercise part of routine and to increase amount of physical activity.;Long Term: Exercising regularly at least 3-5 days a week.       Increase Strength and Stamina Yes       Intervention Provide advice, education, support and counseling about physical activity/exercise needs.;Develop an individualized exercise prescription for aerobic and resistive training based on initial evaluation findings, risk stratification, comorbidities and participant's personal goals.       Expected Outcomes Short Term: Perform resistance training exercises routinely during rehab and add in resistance training at home;Short Term: Increase workloads from initial exercise prescription for resistance, speed, and METs.;Long Term: Improve cardiorespiratory fitness, muscular endurance and strength as measured by increased METs and functional capacity (6MWT)       Able to understand and use rate of perceived exertion (RPE) scale Yes       Intervention Provide education and explanation on how to use RPE scale       Expected Outcomes Short Term: Able to use RPE daily in rehab to express subjective intensity level;Long Term:  Able to use RPE to guide intensity level when exercising independently       Able to understand and use Dyspnea scale Yes       Intervention Provide education and explanation on how to use Dyspnea scale       Expected Outcomes Short Term: Able to use Dyspnea scale daily in rehab to express subjective sense of shortness of breath during  exertion;Long Term: Able to use Dyspnea scale to guide intensity level when exercising independently       Knowledge and understanding of Target Heart Rate Range (THRR) Yes       Intervention Provide education and explanation of THRR including how the numbers were predicted and where they are located for reference       Expected Outcomes Short Term: Able to state/look up THRR;Long Term: Able to use THRR to govern intensity when exercising independently;Short Term: Able to use daily as guideline for intensity in rehab       Able to check pulse independently Yes       Intervention Provide education and demonstration on how to check pulse in carotid and radial arteries.;Review the importance of being able to check your own pulse for safety during independent exercise       Expected Outcomes Short Term: Able to explain why pulse checking is important during independent exercise;Long Term: Able to check pulse independently and accurately       Understanding of Exercise Prescription Yes       Intervention Provide education, explanation, and written materials on patient's individual exercise prescription       Expected Outcomes Short Term: Able to explain program exercise prescription;Long Term: Able to explain home exercise prescription to exercise independently                Exercise Goals Re-Evaluation :  Exercise Goals Re-Evaluation     Row Name 07/15/21 1109 07/25/21 1332 08/08/21 0854 08/12/21 1136 08/22/21 1055     Exercise Goal Re-Evaluation   Exercise Goals Review Increase Physical Activity;Able to understand and use rate of perceived exertion (RPE) scale;Knowledge and understanding of Target Heart  Rate Range (THRR);Understanding of Exercise Prescription;Increase Strength and Stamina;Able to understand and use Dyspnea scale;Able to check pulse independently Increase Physical Activity;Increase Strength and Stamina;Understanding of Exercise Prescription -- Increase Physical Activity;Able to  understand and use rate of perceived exertion (RPE) scale;Knowledge and understanding of Target Heart Rate Range (THRR);Understanding of Exercise Prescription;Increase Strength and Stamina;Able to check pulse independently Increase Physical Activity;Increase Strength and Stamina   Comments Reviewed RPE and dyspnea scales, THR and program prescription with pt today.  Pt voiced understanding and was given a copy of goals to take home. Ralph Marsh is doing well in rehab.  He is already up to 22 laps and level 4 on the T5 NuStep.  We will continue to monitor his progress. Ralph Marsh attends consistently and reaches THR range.  Staff will enourage increasing to 4 lb for strength work. Reviewed home exercise with pt today.  Pt plans to walk for exercise.  Reviewed THR, pulse, RPE, sign and symptoms, pulse oximetery and when to call 911 or MD.  Also discussed weather considerations and indoor options.  Pt voiced understanding. Teryl is doing well in rehab. He is now up to 4 lbs for resistance training. His laps on the track vary anywhere between 15-25 laps and should keep it consistent and progressively increase for better results. Will continue to monitor.   Expected Outcomes Short: Use RPE daily to regulate intensity. Long: Follow program prescription in THR. Short: Continue to attend regularly Long: Continue to follow program prescription Short - try 4 lb Long: continue to build stamina Short: add two extra days of exercise. Long: independently exercise after graduation Short: Keep laps on track consistent and increase laps as tolerated Long: Continue to increase overall MET level    Row Name 08/22/21 1127 09/05/21 1647           Exercise Goal Re-Evaluation   Exercise Goals Review -- Increase Physical Activity;Increase Strength and Stamina      Comments Ralph Marsh walks around the house for now while his wife is out of town. He usually walks with her 20 minutes every other day on a trail near them. He has 5 lbs weights at home,  but has not used them yet. Ralph Marsh does reach THR range during sessions.  Staff will encourage him to increase weigths to 5 lb. and keep working on increasing loads on machines.      Expected Outcomes Short: include weights at home to set up routine Long: Continue to increase overall MET level Short: increase weights to 5 lb Long: improve overall stamina               Discharge Exercise Prescription (Final Exercise Prescription Changes):  Exercise Prescription Changes - 09/05/21 1600       Response to Exercise   Blood Pressure (Admit) 122/62    Blood Pressure (Exit) 112/68    Heart Rate (Admit) 71 bpm    Heart Rate (Exercise) 116 bpm    Heart Rate (Exit) 80 bpm    Rating of Perceived Exertion (Exercise) 11    Symptoms none    Duration Continue with 30 min of aerobic exercise without signs/symptoms of physical distress.    Intensity THRR unchanged      Progression   Progression Continue to progress workloads to maintain intensity without signs/symptoms of physical distress.    Average METs 2.6      Resistance Training   Training Prescription Yes    Weight 4 lb    Reps 10-15  Interval Training   Interval Training No      REL-XR   Level 5    Minutes 15      Track   Laps 15    Minutes 15    METs 1.82      Home Exercise Plan   Plans to continue exercise at Home (comment)   walk   Frequency Add 2 additional days to program exercise sessions.    Initial Home Exercises Provided 08/12/21             Nutrition:  Target Goals: Understanding of nutrition guidelines, daily intake of sodium <1581m, cholesterol <2057m calories 30% from fat and 7% or less from saturated fats, daily to have 5 or more servings of fruits and vegetables.  Education: All About Nutrition: -Group instruction provided by verbal, written material, interactive activities, discussions, models, and posters to present general guidelines for heart healthy nutrition including fat, fiber, MyPlate, the  role of sodium in heart healthy nutrition, utilization of the nutrition label, and utilization of this knowledge for meal planning. Follow up email sent as well. Written material given at graduation. [facility]sheet [facility]  Date 07/27/21  Educator MCYoung HarrisInstruction Review Code 1- Verbalizes Understanding       Biometrics:  Pre Biometrics - 07/12/21 1017       Pre Biometrics   Height _0  (1.803 m)    Weight 199 lb 6.4 oz (90.4 kg)    BMI (Calculated) 27.82    Single Leg Stand 3.8 seconds              Nutrition Therapy Plan and Nutrition Goals:  Nutrition Therapy & Goals - 08/15/21 1634       Nutrition Therapy   Diet Heart healthy, low Na, diabetes friendly    Drug/Food Interactions Food/Disease   oxalate rich food and kidney stones   Protein (specify units) 70g    Fiber 30 grams    Whole Grain Foods 3 servings    Saturated Fats 12 max. grams    Fruits and Vegetables 8 servings/day    Sodium 1.5 grams      Personal Nutrition Goals   Nutrition Goal ST: include fruits and vegetables with low to moderate oxalates and limit those high in oxalates, eat another portion of food or at fat such as avocado to toast to add to satiety LT: lower risk of kidney stones while managing other health conditions, honor hunger.    Comments BG: He does not usually test it. Last A1C 7.5 August. He reports he is hungry all the time. B: oatmeal or just an egg or two with one piece of toast (whole wheat) with small amount of butter. 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 L: sandwich or sometimes soup - canned (sometimes will not eat lunch) S: fruit (apples or banana) D: [place] normally - spinach pasta, fish or chicken and some beef, he is trying to eliminate high oxalate foods and is now realizing that spinach is high in oxalates. Discussed heart healthy eating, diabetes friendly eating and eating to lower risk of kidney stones.       Intervention Plan   Intervention Prescribe, educate and counsel regarding individualized specific dietary modifications aiming towards targeted core components such as weight, hypertension, lipid management, diabetes, heart failure and other comorbidities.;Nutrition handout(s) given to patient.    Expected Outcomes Short Term Goal: Understand basic principles of dietary content, such as calories,  fat, sodium, cholesterol and nutrients.;Short Term Goal: A plan has been developed with personal nutrition goals set during dietitian appointment.;Long Term Goal: Adherence to prescribed nutrition plan.             Nutrition Assessments:  MEDIFICTS Score Key: ?70 Need to make dietary changes  40-70 Heart Healthy Diet ? 40 Therapeutic Level Cholesterol Diet  Flowsheet Row Cardiac Rehab from 07/12/2021 in [facility] Cardiac and Pulmonary Rehab  Picture Your Plate Total Score on Admission 78      Picture Your Plate Scores: <65 Unhealthy dietary pattern with much room for improvement. 41-50 Dietary pattern unlikely to meet recommendations for good health and room for improvement. 51-60 More healthful dietary pattern, with some room for improvement.  >60 Healthy dietary pattern, although there may be some specific behaviors that could be improved.    Nutrition Goals Re-Evaluation:  Nutrition Goals Re-Evaluation     [place] Name 07/22/21 1133             Goals   Current Weight 204 lb (92.5 kg)       Nutrition Goal Decrease red meat.       Comment He states that he eats red meat twice a week. Sometimes he eats to much and other times he doesnt eat enough. After his surgery his weight had went down to 195. He hope that his weight gain is from working out. Canned soups are something he feels like he can cut back on to help his weight because he knows they have alot of sodium. Meet with the dietitian.       Expected Outcome Short: meet with the dietitian and cut back on canned soups. Long: maintain a  diet that pertains to his needs.                Nutrition Goals Discharge (Final Nutrition Goals Re-Evaluation):  Nutrition Goals Re-Evaluation - 07/22/21 1133       Goals   Current Weight 204 lb (92.5 kg)    Nutrition Goal Decrease red meat.    Comment He states that he eats red meat twice a week. Sometimes he eats to much and other times he doesnt eat enough. After his surgery his weight had went down to 195. He hope that his weight gain is from working out. Canned soups are something he feels like he can cut back on to help his weight because he knows they have alot of sodium. Meet with the dietitian.    Expected Outcome Short: meet with the dietitian and cut back on canned soups. Long: maintain a diet that pertains to his needs.             Psychosocial: Target Goals: Acknowledge presence or absence of significant depression and/or stress, maximize coping skills, provide positive support system. Participant is able to verbalize types and ability to use techniques and skills needed for reducing stress and depression.   Education: Stress, Anxiety, and Depression - Group verbal and visual presentation to define topics covered.  Reviews how body is impacted by stress, anxiety, and depression.  Also discusses healthy ways to reduce stress and to treat/manage anxiety and depression.  Written material given at graduation. Flowsheet Row Cardiac Rehab from 08/31/2021 in [facility] Cardiac and Pulmonary Rehab  Date 08/17/21  Educator [facility]  Instruction Review Code 1- Verbalizes Understanding       Education: Sleep Hygiene -Provides group verbal and written instruction about how sleep can affect your health.  Define sleep hygiene, discuss sleep  cycles and impact of sleep habits. Review good sleep hygiene tips.    Initial Review & Psychosocial Screening:  Initial Psych Review & Screening - 06/29/21 1411       Initial Review   Current issues with None Identified      Family Dynamics    Good Support System? Yes   wife   Concerns Inappropriate over/under dependence on family/friends      Barriers   Psychosocial barriers to participate in program There are no identifiable barriers or psychosocial needs.;The patient should benefit from training in stress management and relaxation.      Screening Interventions   Interventions Encouraged to exercise;To provide support and resources with identified psychosocial needs;Provide feedback about the scores to participant    Expected Outcomes Short Term goal: Identification and review with participant of any Quality of Life or Depression concerns found by scoring the questionnaire.;Short Term goal: Utilizing psychosocial counselor, staff and physician to assist with identification of specific Stressors or current issues interfering with healing process. Setting desired goal for each stressor or current issue identified.;Long Term Goal: Stressors or current issues are controlled or eliminated.;Long Term goal: The participant improves quality of Life and PHQ9 Scores as seen by post scores and/or verbalization of changes             Quality of Life Scores:   Quality of Life - 07/12/21 1020       Quality of Life   Select Quality of Life      Quality of Life Scores   Health/Function Pre 24.8 %    Socioeconomic Pre 30 %    Psych/Spiritual Pre 30 %    Family Pre 30 %    GLOBAL Pre 27.56 %            Scores of 19 and below usually indicate a poorer quality of life in these areas.  A difference of  2-3 points is a clinically meaningful difference.  A difference of 2-3 points in the total score of the Quality of Life Index has been associated with significant improvement in overall quality of life, self-image, physical symptoms, and general health in studies assessing change in quality of life.  PHQ-9: Recent Review Flowsheet Data     Depression screen [facility] 2/9 07/12/2021   Decreased Interest 2   Down, Depressed, Hopeless 2   PHQ -  2 Score 4   Altered sleeping 1   Tired, decreased energy 3   Change in appetite 2   Feeling bad or failure about yourself  2   Trouble concentrating 1   Moving slowly or fidgety/restless 2   Suicidal thoughts 0   PHQ-9 Score 15   Difficult doing work/chores Not difficult at all      Interpretation of Total Score  Total Score Depression Severity:  1-4 = Minimal depression, 5-9 = Mild depression, 10-14 = Moderate depression, 15-19 = Moderately severe depression, 20-27 = Severe depression   Psychosocial Evaluation and Intervention:  Psychosocial Evaluation - 06/29/21 1417       Psychosocial Evaluation & Interventions   Interventions Encouraged to exercise with the program and follow exercise prescription    Comments 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 He states in the first few weeks there was some anxiety and maybe even some depression related to his surgery and recovery, but he said the last two weeks he has been feeling a lot better. His wife, who is a Marine scientist, has been very supportive and helped  keep him on track. He has been walking three days a week and reports feeling well after. They are looking forward to starting cardiac rehab for education and exercise guidelines    Expected Outcomes Short: attend cardiac rehab for education and exercise. Long: develop and maintain positive self care habits.    Continue Psychosocial Services  Follow up required by staff             Psychosocial Re-Evaluation:  Psychosocial Re-Evaluation     [place] Name 07/22/21 1139 08/22/21 1129           Psychosocial Re-Evaluation   Current issues with Current Sleep Concerns;Current Depression Current Sleep Concerns;Current Depression      Comments Ralph Marsh takes a sleeping pill at night to help him get to sleep. He wants to come off of it. He states that his heart procedure is giving him some depression. Informed patient that if he feels like his depression is getting worse to let us and his  doctor know. He continues to take a sleeping pill, helps most of the time - he would like to start weaning (it his habit forming) - encouraged him to work with his MD. He is more stressed now this his wife is away he reports he will better once she returns. He reports not taking any medication for depression, but feels he is doing well. His wife is a good support system for him. Ralph Marsh has a good sense of humor and feels that it helps with his mood. He likes to watch TV and goes on walks with his wife.      Expected Outcomes Short: Continue to exercise regularly to support mental health and notify staff of any changes. Long: maintain mental health and well being through teaching of rehab or prescribed medications independently. Short: Continue to exercise regularly to support mental health and notify staff of any changes. Long: maintain mental health and well being through teaching of rehab or prescribed medications independently.      Interventions Encouraged to attend Cardiac Rehabilitation for the exercise;Stress management education;Relaxation education Encouraged to attend Cardiac Rehabilitation for the exercise;Stress management education;Relaxation education      Continue Psychosocial Services  Follow up required by staff Follow up required by staff               Psychosocial Discharge (Final Psychosocial Re-Evaluation):  Psychosocial Re-Evaluation - 08/22/21 1129       Psychosocial Re-Evaluation   Current issues with Current Sleep Concerns;Current Depression    Comments He continues to take a sleeping pill, helps most of the time - he would like to start weaning (it his habit forming) - encouraged him to work with his MD. He is more stressed now this his wife is away he reports he will better once she returns. He reports not taking any medication for depression, but feels he is doing well. His wife is a good support system for him. Ralph Marsh has a good sense of humor and feels that it helps with his  mood. He likes to watch TV and goes on walks with his wife.    Expected Outcomes Short: Continue to exercise regularly to support mental health and notify staff of any changes. Long: maintain mental health and well being through teaching of rehab or prescribed medications independently.    Interventions Encouraged to attend Cardiac Rehabilitation for the exercise;Stress management education;Relaxation education    Continue Psychosocial Services  Follow up required by staff  Vocational Rehabilitation: Provide vocational rehab assistance to qualifying candidates.   Vocational Rehab Evaluation & Intervention:  Vocational Rehab - 06/29/21 1407       Initial Vocational Rehab Evaluation & Intervention   Assessment shows need for Vocational Rehabilitation No             Education: Education Goals: Education classes will be provided on a variety of topics geared toward better understanding of heart health and risk factor modification. Participant will state understanding/return demonstration of topics presented as noted by education test scores.  Learning Barriers/Preferences:  Learning Barriers/Preferences - 06/29/21 1419       Learning Barriers/Preferences   Learning Barriers None    Learning Preferences None             General Cardiac Education Topics:  AED/CPR: - Group verbal and written instruction with the use of models to demonstrate the basic use of the AED with the basic ABC's of resuscitation.   Anatomy and Cardiac Procedures: - Group verbal and visual presentation and models provide information about basic cardiac anatomy and function. Reviews the testing methods done to diagnose heart disease and the outcomes of the test results. Describes the treatment choices: Medical Management, Angioplasty, or Coronary Bypass Surgery for treating various heart conditions including Myocardial Infarction, Angina, Valve Disease, and Cardiac Arrhythmias.  Written  material given at graduation.   Medication Safety: - Group verbal and visual instruction to review commonly prescribed medications for heart and lung disease. Reviews the medication, class of the drug, and side effects. Includes the steps to properly store meds and maintain the prescription regimen.  Written material given at graduation. Flowsheet Row Cardiac Rehab from 08/31/2021 in [facility] Cardiac and Pulmonary Rehab  Date 07/20/21  Educator SB  Instruction Review Code 1- Verbalizes Understanding       Intimacy: - Group verbal instruction through game format to discuss how heart and lung disease can affect sexual intimacy. Written material given at graduation.. Flowsheet Row Cardiac Rehab from 08/31/2021 in [facility] Cardiac and Pulmonary Rehab  Date 08/31/21  Educator [facility]  Instruction Review Code 1- Verbalizes Understanding       Know Your Numbers and Heart Failure: - Group verbal and visual instruction to discuss disease risk factors for cardiac and pulmonary disease and treatment options.  Reviews associated critical values for Overweight/Obesity, Hypertension, Cholesterol, and Diabetes.  Discusses basics of heart failure: signs/symptoms and treatments.  Introduces Heart Failure Zone chart for action plan for heart failure.  Written material given at graduation. Flowsheet Row Cardiac Rehab from 08/31/2021 in [facility] Cardiac and Pulmonary Rehab  Education need identified 07/12/21  Date 08/03/21  Educator KB  Instruction Review Code 1- Verbalizes Understanding       Infection Prevention: - Provides verbal and written material to individual with discussion of infection control including proper hand washing and proper equipment cleaning during exercise session. Flowsheet Row Cardiac Rehab from 08/31/2021 in [facility] Cardiac and Pulmonary Rehab  Date 07/12/21  Educator [facility]  Instruction Review Code 1- Verbalizes Understanding       Falls Prevention: - Provides verbal and written material  to individual with discussion of falls prevention and safety. Flowsheet Row Cardiac Rehab from 08/31/2021 in [facility] Cardiac and Pulmonary Rehab  Date 07/12/21  Educator [facility]  Instruction Review Code 1- Verbalizes Understanding       Other: -Provides group and verbal instruction on various topics (see comments)   Knowledge Questionnaire Score:  Knowledge Questionnaire Score - 07/12/21 1020  Knowledge Questionnaire Score   Pre Score 21/26             Core Components/Risk Factors/Patient Goals at Admission:  Personal Goals and Risk Factors at Admission - 07/12/21 1021       Core Components/Risk Factors/Patient Goals on Admission    Weight Management Yes;Weight Loss    Intervention Weight Management: Develop a combined nutrition and exercise program designed to reach desired caloric intake, while maintaining appropriate intake of nutrient and fiber, sodium and fats, and appropriate energy expenditure required for the weight goal.;Weight Management: Provide education and appropriate resources to help participant work on and attain dietary goals.;Weight Management/Obesity: Establish reasonable short term and long term weight goals.    Admit Weight 199 lb 6.4 oz (90.4 kg)    Goal Weight: Short Term 194 lb (88 kg)    Goal Weight: Long Term 190 lb (86.2 kg)    Expected Outcomes Short Term: Continue to assess and modify interventions until short term weight is achieved;Long Term: Adherence to nutrition and physical activity/exercise program aimed toward attainment of established weight goal;Weight Loss: Understanding of general recommendations for a balanced deficit meal plan, which promotes 1-2 lb weight loss per week and includes a negative energy balance of [phone] kcal/d;Understanding recommendations for meals to include 15-35% energy as protein, 25-35% energy from fat, 35-60% energy from carbohydrates, less than 273m of dietary cholesterol, 20-35 gm of total fiber  daily;Understanding of distribution of calorie intake throughout the day with the consumption of 4-5 meals/snacks    Heart Failure Yes    Intervention Provide a combined exercise and nutrition program that is supplemented with education, support and counseling about heart failure. Directed toward relieving symptoms such as shortness of breath, decreased exercise tolerance, and extremity edema.    Expected Outcomes Improve functional capacity of life;Short term: Attendance in program 2-3 days a week with increased exercise capacity. Reported lower sodium intake. Reported increased fruit and vegetable intake. Reports medication compliance.;Short term: Daily weights obtained and reported for increase. Utilizing diuretic protocols set by physician.;Long term: Adoption of self-care skills and reduction of barriers for early signs and symptoms recognition and intervention leading to self-care maintenance.    Hypertension Yes    Intervention Provide education on lifestyle modifcations including regular physical activity/exercise, weight management, moderate sodium restriction and increased consumption of fresh fruit, vegetables, and low fat dairy, alcohol moderation, and smoking cessation.;Monitor prescription use compliance.    Expected Outcomes Short Term: Continued assessment and intervention until BP is < 140/973mHG in hypertensive participants. < 130/8066mG in hypertensive participants with diabetes, heart failure or chronic kidney disease.;Long Term: Maintenance of blood pressure at goal levels.    Lipids Yes    Intervention Provide education and support for participant on nutrition & aerobic/resistive exercise along with prescribed medications to achieve LDL <32m41mDL >40mg30m Expected Outcomes Short Term: Participant states understanding of desired cholesterol values and is compliant with medications prescribed. Participant is following exercise prescription and nutrition guidelines.;Long Term:  Cholesterol controlled with medications as prescribed, with individualized exercise RX and with personalized nutrition plan. Value goals: LDL < 32mg,25m > 40 mg.             Education:Diabetes - Individual verbal and written instruction to review signs/symptoms of diabetes, desired ranges of glucose level fasting, after meals and with exercise. Acknowledge that pre and post exercise glucose checks will be done for 3 sessions at entry of program. Flowsheet Row Cardiac Rehab from 08/31/2021 in  [facility] Re[facility]  Date 06/29/21  Educator [facility]  Instruction Review Code 1- Verbalizes Understanding       Core Components/Risk Factors/Patient Goals Review:   Goals and Risk Factor Review     Row Name 07/22/21 1126 08/22/21 1115           Core Components/Risk Factors/Patient Goals Review   Personal Goals Review Hypertension;Diabetes Diabetes;Hypertension      Review Ralph Marsh has been checking his blood pressure at home ecery other day and his hreading are doing well. He states he is 100-110s over 60s on average. Ralph Marsh has spoke to his doctor about his diabetes and is checking his A1C every three months. He is watching what he eats but he states he is hungy alot. His provider states that his A1C should be below 8. He takes metforamin in the morning and at night. He goes by how he feels and knows how his sugars are stable. Ralph Marsh and his wife both do not check their BG and they fo by A1C readings (last one 7.5, due for his next one). Ralph Marsh has been trying to eat in a way that prevents kidney stones as well as supports his diabetes and heart - working with RD. He is still checking his BP every other day at home - 110-20/60-70. He reports taking his medication as prescribed and is having no issues.      Expected Outcomes Short: maintain blood pressure reading in class and watch diet for his diabetes. Long: keep brining down A1C. Short: continue monitoring risk factors Long: keep brining down A1C.                Core Components/Risk Factors/Patient Goals at Discharge (Final Review):   Goals and Risk Factor Review - 08/22/21 1115       Core Components/Risk Factors/Patient Goals Review   Personal Goals Review Diabetes;Hypertension    Review Ralph Marsh and his wife both do not check their BG and they fo by A1C readings (last one 7.5, due for his next one). Ralph Marsh has been trying to eat in a way that prevents kidney stones as well as supports his diabetes and heart - working with RD. He is still checking his BP every other day at home - 110-20/60-70. He reports taking his medication as prescribed and is having no issues.    Expected Outcomes Short: continue monitoring risk factors Long: keep brining down A1C.             ITP Comments:  ITP Comments     Row Name 06/29/21 1437 07/12/21 1013 07/13/21 0647 07/15/21 1108 08/10/21 0946   ITP Comments Initial telephone orientation completed. Diagnosis can be found in [facility] 7/9. EP orientation scheduled Tuesday 9/6 at 8am. Completed 6MWT and gym orientation. Initial ITP created and sent for review to Dr. Emily Filbert, Medical Director. 30 Day review completed. Medical Director ITP review done, changes made as directed, and signed approval by Medical Director. First full day of exercise!  Patient was oriented to gym and equipment including functions, settings, policies, and procedures.  Patient's individual exercise prescription and treatment plan were reviewed.  All starting workloads were established based on the results of the 6 minute walk test done at initial orientation visit.  The plan for exercise progression was also introduced and progression will be customized based on patient's performance and goals. 30 day review completed. ITP sent to Dr. Emily Filbert, Medical Director of Cardiac Rehab. Continue with ITP unless changes are made by  physician.    [place] Name 08/15/21 1719 09/07/21 0747         ITP Comments Completed initial RD evaluation 30 Day  review completed. Medical Director ITP review done, changes made as directed, and signed approval by Medical Director.               Comments:  30 Day review completed. Medical Director ITP review done, changes made as directed, and signed approval by Medical Director.

## 2021-09-12 ENCOUNTER — Emergency Department: Payer: No Typology Code available for payment source

## 2021-09-12 ENCOUNTER — Inpatient Hospital Stay
Admission: EM | Admit: 2021-09-12 | Discharge: 2021-09-14 | DRG: 271 | Disposition: A | Discharge status: Home / Self Care | Payer: No Typology Code available for payment source | Attending: Internal Medicine | Admitting: Internal Medicine

## 2021-09-12 ENCOUNTER — Other Ambulatory Visit: Payer: Self-pay

## 2021-09-12 DIAGNOSIS — I824Y1 Acute embolism and thrombosis of unspecified deep veins of right proximal lower extremity: Secondary | ICD-10-CM | POA: Diagnosis not present

## 2021-09-12 DIAGNOSIS — I82401 Acute embolism and thrombosis of unspecified deep veins of right lower extremity: Secondary | ICD-10-CM | POA: Diagnosis present

## 2021-09-12 DIAGNOSIS — M109 Gout, unspecified: Secondary | ICD-10-CM | POA: Diagnosis present

## 2021-09-12 DIAGNOSIS — I82411 Acute embolism and thrombosis of right femoral vein: Secondary | ICD-10-CM | POA: Diagnosis present

## 2021-09-12 DIAGNOSIS — K219 Gastro-esophageal reflux disease without esophagitis: Secondary | ICD-10-CM | POA: Diagnosis present

## 2021-09-12 DIAGNOSIS — M1990 Unspecified osteoarthritis, unspecified site: Secondary | ICD-10-CM | POA: Diagnosis present

## 2021-09-12 DIAGNOSIS — Z20822 Contact with and (suspected) exposure to covid-19: Secondary | ICD-10-CM | POA: Diagnosis present

## 2021-09-12 DIAGNOSIS — I82431 Acute embolism and thrombosis of right popliteal vein: Secondary | ICD-10-CM | POA: Diagnosis present

## 2021-09-12 DIAGNOSIS — Z87442 Personal history of urinary calculi: Secondary | ICD-10-CM | POA: Diagnosis not present

## 2021-09-12 DIAGNOSIS — M549 Dorsalgia, unspecified: Secondary | ICD-10-CM | POA: Diagnosis not present

## 2021-09-12 DIAGNOSIS — M546 Pain in thoracic spine: Secondary | ICD-10-CM

## 2021-09-12 DIAGNOSIS — Z91040 Latex allergy status: Secondary | ICD-10-CM

## 2021-09-12 DIAGNOSIS — N400 Enlarged prostate without lower urinary tract symptoms: Secondary | ICD-10-CM | POA: Diagnosis present

## 2021-09-12 DIAGNOSIS — R0902 Hypoxemia: Secondary | ICD-10-CM | POA: Diagnosis present

## 2021-09-12 DIAGNOSIS — Z7982 Long term (current) use of aspirin: Secondary | ICD-10-CM

## 2021-09-12 DIAGNOSIS — I745 Embolism and thrombosis of iliac artery: Secondary | ICD-10-CM | POA: Diagnosis present

## 2021-09-12 DIAGNOSIS — E785 Hyperlipidemia, unspecified: Secondary | ICD-10-CM | POA: Diagnosis present

## 2021-09-12 DIAGNOSIS — Z888 Allergy status to other drugs, medicaments and biological substances status: Secondary | ICD-10-CM | POA: Diagnosis not present

## 2021-09-12 DIAGNOSIS — Z86718 Personal history of other venous thrombosis and embolism: Secondary | ICD-10-CM | POA: Diagnosis not present

## 2021-09-12 DIAGNOSIS — I110 Hypertensive heart disease with heart failure: Secondary | ICD-10-CM | POA: Diagnosis present

## 2021-09-12 DIAGNOSIS — Z87891 Personal history of nicotine dependence: Secondary | ICD-10-CM

## 2021-09-12 DIAGNOSIS — E1169 Type 2 diabetes mellitus with other specified complication: Secondary | ICD-10-CM | POA: Diagnosis present

## 2021-09-12 DIAGNOSIS — E119 Type 2 diabetes mellitus without complications: Secondary | ICD-10-CM

## 2021-09-12 DIAGNOSIS — I10 Essential (primary) hypertension: Secondary | ICD-10-CM | POA: Diagnosis not present

## 2021-09-12 DIAGNOSIS — I2510 Atherosclerotic heart disease of native coronary artery without angina pectoris: Secondary | ICD-10-CM | POA: Diagnosis present

## 2021-09-12 DIAGNOSIS — Z79899 Other long term (current) drug therapy: Secondary | ICD-10-CM | POA: Diagnosis not present

## 2021-09-12 DIAGNOSIS — Z794 Long term (current) use of insulin: Secondary | ICD-10-CM | POA: Diagnosis not present

## 2021-09-12 DIAGNOSIS — I5032 Chronic diastolic (congestive) heart failure: Secondary | ICD-10-CM | POA: Diagnosis present

## 2021-09-12 DIAGNOSIS — I959 Hypotension, unspecified: Secondary | ICD-10-CM | POA: Diagnosis not present

## 2021-09-12 DIAGNOSIS — Z882 Allergy status to sulfonamides status: Secondary | ICD-10-CM | POA: Diagnosis not present

## 2021-09-12 DIAGNOSIS — Z7984 Long term (current) use of oral hypoglycemic drugs: Secondary | ICD-10-CM

## 2021-09-12 DIAGNOSIS — J439 Emphysema, unspecified: Secondary | ICD-10-CM | POA: Diagnosis present

## 2021-09-12 LAB — COMPREHENSIVE METABOLIC PANEL
ALT: 21 U/L (ref 0–44)
AST: 18 U/L (ref 15–41)
Albumin: 2.8 g/dL — ABNORMAL LOW (ref 3.5–5.0)
Alkaline Phosphatase: 107 U/L (ref 38–126)
Anion gap: 8 (ref 5–15)
BUN: 18 mg/dL (ref 8–23)
CO2: 24 mmol/L (ref 22–32)
Calcium: 8.4 mg/dL — ABNORMAL LOW (ref 8.9–10.3)
Chloride: 105 mmol/L (ref 98–111)
Creatinine, Ser: 1.22 mg/dL (ref 0.61–1.24)
GFR, Estimated: >60 mL/min (ref >60)
Glucose, Bld: 144 mg/dL — ABNORMAL HIGH (ref 70–99)
Potassium: 3.6 mmol/L (ref 3.5–5.1)
Sodium: 137 mmol/L (ref 135–145)
Total Bilirubin: 0.7 mg/dL (ref 0.3–1.2)
Total Protein: 6.4 g/dL — ABNORMAL LOW (ref 6.5–8.1)

## 2021-09-12 LAB — CBC
HCT: 42.2 % (ref 39.0–52.0)
Hemoglobin: 14.7 g/dL (ref 13.0–17.0)
MCH: 31.5 pg (ref 26.0–34.0)
MCHC: 34.8 g/dL (ref 30.0–36.0)
MCV: 90.4 fL (ref 80.0–100.0)
Platelets: 206 10*3/uL (ref 150–400)
RBC: 4.67 MIL/uL (ref 4.22–5.81)
RDW: 13.1 % (ref 11.5–15.5)
WBC: 9.3 10*3/uL (ref 4.0–10.5)
nRBC: 0 % (ref 0.0–0.2)

## 2021-09-12 LAB — TROPONIN I (HIGH SENSITIVITY)
Troponin I (High Sensitivity): 10 ng/L (ref <18)
Troponin I (High Sensitivity): 10 ng/L (ref <18)

## 2021-09-12 LAB — D-DIMER, QUANTITATIVE: D-Dimer, Quant: 3.56 ug/mL-FEU — ABNORMAL HIGH (ref 0.00–0.50)

## 2021-09-12 MED ORDER — HEPARIN (PORCINE) 25000 UT/250ML-% IV SOLN
1500.0000 [IU]/h | INTRAVENOUS | Status: DC
  Administered 2021-09-12: 1500 [IU]/h via INTRAVENOUS
  Filled 2021-09-12: qty 250

## 2021-09-12 MED ORDER — IOHEXOL 350 MG/ML SOLN
75.0000 mL | Freq: Once | INTRAVENOUS | Status: AC | PRN
  Administered 2021-09-12: 75 mL via INTRAVENOUS

## 2021-09-12 MED ORDER — HEPARIN BOLUS VIA INFUSION
5000.0000 [IU] | Freq: Once | INTRAVENOUS | Status: AC
  Administered 2021-09-12: 5000 [IU] via INTRAVENOUS
  Filled 2021-09-12: qty 5000

## 2021-09-12 NOTE — ED Provider Notes (Signed)
[facility] Emergency Department Provider Note  ____________________________________________   Event Date/Time   First MD Initiated Contact with Patient 09/12/21 2007     (approximate)  I have reviewed the triage vital signs and the nursing notes.   HISTORY  Chief Complaint Leg Pain    HPI Ralph Marsh is a 75 y.o. male with history of DVT in the left leg secondary to a thought lithotripsy who now comes in with right leg swelling.  States that started 2 or 3 days ago.  States that he has been more inactive due to recently being sick and he states that he is starting to feel better but still has some lingering bronchitis.  When asked to clarify the bronchitis he states that he has been having a cough with some shortness of breath, nothing makes it better, worse with exertion.       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   CHF (congestive heart failure), NYHA class I, acute, diastolic ([place]) 14/43/1540   CAD (coronary artery disease) 05/18/2021   DVT (deep venous thrombosis) ([place]) 05/18/2021   NSTEMI (non-ST elevated myocardial infarction) ([place]) 05/14/2021   Long term current use of anticoagulant 05/14/2021   Gout 05/14/2021   Other intervertebral disc degeneration, lumbosacral region 11/24/2019   Essential hypertension 06/04/2017   GERD (gastroesophageal reflux disease) 06/04/2017   Hyperlipidemia 06/04/2017   Acute embolism and thrombosis of deep vein of left lower extremity ([place]) 05/15/2017   Acute deep vein thrombosis (DVT) of femoral vein of left lower extremity ([place])  05/13/2017   Kidney stones 02/01/2017   Obstructive uropathy 01/20/2017   PVC's (premature ventricular contractions) 11/24/2016   Type 2 diabetes mellitus without complications ([place]) 08/67/6195    Past Surgical History:  Procedure Laterality Date   CATARACT EXTRACTION W/PHACO Right 09/11/2018   Procedure: CATARACT EXTRACTION PHACO AND INTRAOCULAR LENS PLACEMENT ([place])  RIGHT DIABETIC;  Surgeon: Leandrew Koyanagi, MD;  Location: [place];  Service: Ophthalmology;  Laterality: Right;  Diabetic - oral meds Latex sensitiviy   CATARACT EXTRACTION W/PHACO Left 10/09/2018   Procedure: CATARACT EXTRACTION PHACO AND INTRAOCULAR LENS PLACEMENT ([place])  LEFT DIABETIC;  Surgeon: Leandrew Koyanagi, MD;  Location: [place];  Service: Ophthalmology;  Laterality: Left;  Diabetic - oral meds Latex sensitivity   COLONOSCOPY WITH PROPOFOL N/A 07/28/2019   Procedure: COLONOSCOPY WITH PROPOFOL;  Surgeon: Toledo, Benay Pike, MD;  Location: [facility];  Service: Gastroenterology;  Laterality: N/A;   EXTRACORPOREAL SHOCK WAVE LITHOTRIPSY Left 01/25/2017   Procedure: EXTRACORPOREAL SHOCK WAVE LITHOTRIPSY (ESWL);  Surgeon: Nickie Retort, MD;  Location: [facility];  Service: Urology;  Laterality: Left;   LASER OF PROSTATE W/ GREEN LIGHT PVP  2008   Dr. Quillian Quince   LEFT HEART CATH AND CORONARY ANGIOGRAPHY N/A 05/16/2021   Procedure: LEFT HEART CATH AND CORONARY ANGIOGRAPHY;  Surgeon: Corey Skains, MD;  Location: [facility];  Service: Cardiovascular;  Laterality: N/A;   LUMBAR LAMINECTOMY     TONSILLECTOMY  AND ADENOIDECTOMY     URETEROSCOPY WITH HOLMIUM LASER LITHOTRIPSY  2004    Prior to Admission medications   Medication Sig Start Date End Date Taking? Authorizing Provider  allopurinol (ZYLOPRIM) 300 MG tablet Take 1 tablet by mouth daily. Patient not taking: Reported on 06/29/2021 11/20/18   [provider]  aspirin EC 81 MG tablet Take 81 mg by mouth daily.     [provider]  Cholecalciferol 1000 UNITS tablet Take 1,000 Units by mouth 2 (two) times daily.    [provider]  esomeprazole (NEXIUM) 40 MG capsule Take 1 capsule by mouth daily. 10/24/19   [provider]  Eszopiclone 3 MG TABS  06/07/21   [provider]  heparin 25000 UT/250ML infusion Inject 1,300 Units/hr into the vein continuous. Patient not taking: Reported on 06/29/2021 05/18/21   Sharen Hones, MD  insulin detemir (LEVEMIR) 100 UNIT/ML injection Inject 0.14 mLs (14 Units total) into the skin at bedtime. Patient not taking: Reported on 06/29/2021 05/18/21   Sharen Hones, MD  ketorolac (TORADOL) 10 MG tablet Take 1 tablet (10 mg total) by mouth every 6 (six) hours as needed for moderate pain or severe pain. Patient not taking: Reported on 06/29/2021 03/14/21   Hinda Kehr, MD  lisinopril (ZESTRIL) 20 MG tablet Take 10 mg by mouth daily. Patient not taking: Reported on 06/29/2021 11/26/20   [provider]  metFORMIN (GLUCOPHAGE) 500 MG tablet Take by mouth. 05/24/21   [provider]  metoprolol tartrate (LOPRESSOR) 25 MG tablet Take 0.5 tablets (12.5 mg total) by mouth 2 (two) times daily. 05/18/21   Sharen Hones, MD  morphine (MS CONTIN) 30 MG 12 hr tablet Take 30 mg by mouth 2 (two) times daily. Patient not taking: Reported on 06/29/2021    [provider]  morphine (MSIR) 15 MG tablet Take 15 mg by mouth 3 (three) times daily as needed. Patient not taking: Reported on 06/29/2021    [provider]  nitroGLYCERIN (NITROSTAT) 0.4 MG SL tablet Place 1 tablet (0.4 mg total) under the tongue every 5 (five) minutes x 3 doses as needed for chest pain. Patient not taking: Reported on 06/29/2021 05/18/21   Sharen Hones, MD    Allergies Augmentin [amoxicillin-pot clavulanate], Latex, Other, Solifenacin, Sulfa antibiotics, Rosuvastatin, Atorvastatin, Omeprazole, Pravastatin, and Simvastatin  Family History  Problem Relation Age  of Onset   Kidney disease Neg Hx    Kidney cancer Neg Hx    Prostate cancer Neg Hx     Social History Social History   Tobacco Use   Smoking status: Former    Packs/day: 1.00    Years: 15.00    Pack years: 15.00    Types: Cigarettes    Quit date: 1995    Years since quitting: 27.8   Smokeless tobacco: Never  Vaping Use   Vaping Use: Never used  Substance Use Topics   Alcohol use: Yes    Alcohol/week: 0.0 standard drinks    Comment: Holidays only   Drug use: No      Review of Systems Constitutional: No fever/chills Eyes: No visual changes. ENT: No sore throat. Cardiovascular: Denies chest pain. Respiratory: Positive shortness of breath, bronchitis Gastrointestinal: No abdominal pain.  No nausea, no vomiting.  No diarrhea.  No constipation. Genitourinary: Negative for dysuria. Musculoskeletal: Negative for back pain.  Positive right leg swelling Skin: Negative for rash. Neurological: Negative for headaches, focal weakness or numbness. All other ROS negative ____________________________________________   PHYSICAL EXAM:  VITAL  SIGNS: ED Triage Vitals  Enc Vitals Group     BP 09/12/21 1852 135/84     Pulse Rate 09/12/21 1852 87     Resp 09/12/21 1852 20     Temp 09/12/21 1852 98 F (36.7 C)     Temp Source 09/12/21 1852 Oral     SpO2 09/12/21 1852 95 %     Weight 09/12/21 1904 194 lb (88 kg)     Height 09/12/21 1904 6\' 1"  (1.854 m)     Head Circumference --      Peak Flow --      Pain Score 09/12/21 1853 1     Pain Loc --      Pain Edu? --      Excl. in [place]? --     Constitutional: Alert and oriented. Well appearing and in no acute distress. Eyes: Conjunctivae are normal. EOMI. Head: Atraumatic. Nose: No congestion/rhinnorhea. Mouth/Throat: Mucous membranes are moist.   Neck: No stridor. Trachea Midline. FROM Cardiovascular: Normal rate, regular rhythm. Grossly normal heart sounds.  Good peripheral circulation. Respiratory: Normal respiratory effort.   No retractions. Lungs CTAB. Gastrointestinal: Soft and nontender. No distention. No abdominal bruits.  Musculoskeletal: swelling to the right leg with 2+ distal pulse.  No skin changes noted. Neurologic:  Normal speech and language. No gross focal neurologic deficits are appreciated.  Skin:  Skin is warm, dry and intact. No rash noted. Psychiatric: Mood and affect are normal. Speech and behavior are normal. GU: Deferred   ____________________________________________   LABS (all labs ordered are listed, but only abnormal results are displayed)  Labs Reviewed  D-DIMER, QUANTITATIVE - Abnormal; Notable for the following components:      Result Value   D-Dimer, Quant 3.56 (*)    All other components within normal limits  CBC  BASIC METABOLIC PANEL  TROPONIN I (HIGH SENSITIVITY)   ____________________________________________   ED ECG REPORT I, Vanessa [place], the attending physician, personally viewed and interpreted this ECG.  Normal sinus rate of 97, no ST elevation, no T wave inversions, normal intervals ____________________________________________  RADIOLOGY   Official radiology report(s): CT Angio Chest PE W and/or Wo Contrast  Result Date: 09/12/2021 CLINICAL DATA:  Lower extremity swelling EXAM: CT ANGIOGRAPHY CHEST WITH CONTRAST TECHNIQUE: Multidetector CT imaging of the chest was performed using the standard protocol during bolus administration of intravenous contrast. Multiplanar CT image reconstructions and MIPs were obtained to evaluate the vascular anatomy. CONTRAST:  80mL OMNIPAQUE IOHEXOL 350 MG/ML SOLN COMPARISON:  Chest CT 05/14/2021 FINDINGS: Cardiovascular: Satisfactory opacification of the pulmonary arteries to the segmental level. No evidence of pulmonary embolism. Normal heart size. No pericardial effusion. Post CABG changes. Coronary vascular calcification Mediastinum/Nodes: Midline trachea. No thyroid mass. No suspicious adenopathy. Esophagus within normal  limits. Fat containing hiatal hernia. Lungs/Pleura: Lungs are clear. No pleural effusion or pneumothorax. Upper Abdomen: No acute abnormality.  Gallstones. Musculoskeletal: No chest wall abnormality. No acute or significant osseous findings. Review of the MIP images confirms the above findings. IMPRESSION: 1. Negative for acute pulmonary embolus. Negative for aortic dissection. 2. Clear lung fields 3. Gallstones Aortic Atherosclerosis (ICD10-I70.0). Electronically Signed   By: Donavan Foil M.D.   On: 09/12/2021 22:51   US Venous Img Lower Unilateral Right  Result Date: 09/12/2021 CLINICAL DATA:  Right leg swelling EXAM: RIGHT LOWER EXTREMITY VENOUS DOPPLER ULTRASOUND TECHNIQUE: Gray-scale sonography with graded compression, as well as color Doppler and duplex ultrasound were performed to evaluate the lower extremity deep venous systems from  the level of the common femoral vein and including the common femoral, femoral, profunda femoral, popliteal and calf veins including the posterior tibial, peroneal and gastrocnemius veins when visible. The superficial great saphenous vein was also interrogated. Spectral Doppler was utilized to evaluate flow at rest and with distal augmentation maneuvers in the common femoral, femoral and popliteal veins. COMPARISON:  None. FINDINGS: Contralateral Common Femoral Vein: No evidence of thrombus. Normal compressibility and flow on color Doppler imaging. Common Femoral Vein: Occlusive thrombus. Vessel is noncompressible. Saphenofemoral Junction: Occlusive thrombus. Vessel is noncompressible. Profunda Femoral Vein: Occlusive thrombus. Vessel is noncompressible. Femoral Vein: Occlusive thrombus. Vessel is noncompressible. Popliteal Vein: Occlusive thrombus. Vessel is noncompressible. Calf Veins: No evidence of thrombus. Normal compressibility and flow on color Doppler imaging. Superficial Great Saphenous Vein: No evidence of thrombus. Normal compressibility. Venous Reflux:  None.  Other Findings:  None. IMPRESSION: Extensive occlusive deep venous thrombosis within the right lower extremity extending from the common femoral vein to the popliteal vein. These results were called by telephone at the time of interpretation on 09/12/2021 at 8:30 pm to provider Dr Jari Pigg, who verbally acknowledged these results. Electronically Signed   By: Davina Poke D.O.   On: 09/12/2021 20:30    ____________________________________________   PROCEDURES  Procedure(s) performed (including Critical Care):  .Critical Care Performed by: Vanessa [place], MD Authorized by: Vanessa [place], MD   Critical care provider statement:    Critical care time (minutes):  35   Critical care was necessary to treat or prevent imminent or life-threatening deterioration of the following conditions: DVT requiring heparin.   Critical care was time spent personally by me on the following activities:  Blood draw for specimens, development of treatment plan with patient or surrogate, evaluation of patient's response to treatment, examination of patient, ordering and performing treatments and interventions, ordering and review of laboratory studies, ordering and review of radiographic studies, pulse oximetry and re-evaluation of patient's condition   Care discussed with: admitting provider     ____________________________________________   INITIAL IMPRESSION / ASSESSMENT AND PLAN / ED COURSE  Ralph Marsh was evaluated in Emergency Department on 09/12/2021 for the symptoms described in the history of present illness. He was evaluated in the context of the global COVID-19 pandemic, which necessitated consideration that the patient might be at risk for infection with the SARS-CoV-2 virus that causes COVID-19. Institutional protocols and algorithms that pertain to the evaluation of patients at risk for COVID-19 are in a state of rapid change based on information released by regulatory bodies including the CDC and  federal and state organizations. These policies and algorithms were followed during the patient's care in the ED.    Patient comes in with concern for right leg swelling.  Patient's ultrasound was ordered that was confirming DVT.  Patient is 2+ distal pulses no skin changes.  However patient does report some cough and shortness of breath so we will get CT PE to evaluate for concurrent pulmonary embolism.   10:02 PM discussed with Dr. Delana Meyer who stated that patient could be admitted if his pain was uncontrolled in the continue heparin and be thrombectomy tomorrow or he can follow-up outpatient given it is just to be done within 10 days  Patient CT PE was negative.  I discussed with patient different options.  Patient felt most comfortable with coming into the hospital for heparin infusion given patient reports not being able to ambulate well secondary to the DVT.  Will discuss with hospital team  for admission       ____________________________________________   FINAL CLINICAL IMPRESSION(S) / ED DIAGNOSES   Final diagnoses:  Acute deep vein thrombosis (DVT) of proximal vein of right lower extremity (HCC)      MEDICATIONS GIVEN DURING THIS VISIT:  Medications  iohexol (OMNIPAQUE) 350 MG/ML injection 75 mL (75 mLs Intravenous Contrast Given 09/12/21 2228)     ED Discharge Orders     None        Note:  This document was prepared using Dragon voice recognition software and may include unintentional dictation errors.    Vanessa [place], MD 09/12/21 [phone]

## 2021-09-12 NOTE — Progress Notes (Signed)
ANTICOAGULATION CONSULT NOTE - Initial Consult  Pharmacy Consult for Heparin  Indication: DVT  Allergies  Allergen Reactions   Augmentin [Amoxicillin-Pot Clavulanate] Anaphylaxis   Latex Other (See Comments)    Nasal inflammation   Other Anaphylaxis   Solifenacin Other (See Comments)   Sulfa Antibiotics Anaphylaxis   Rosuvastatin     Other reaction(s): Muscle pain   Atorvastatin     Other reaction(s): Muscle Pain, Pain in lower limb   Omeprazole     Other reaction(s): Heartburn   Pravastatin     Other reaction(s): Muscle pain   Simvastatin Other (See Comments)    Other reaction(s): Fatigue, Muscle pain    Patient Measurements: Height: 6\' 1"  (185.4 cm) Weight: 88 kg (194 lb) IBW/kg (Calculated) : 79.9 Heparin Dosing Weight: 88 kg   Vital Signs: Temp: 98 F (36.7 C) (11/07 1852) Temp Source: Oral (11/07 1852) BP: 122/80 (11/07 2300) Pulse Rate: 87 (11/07 2300)  Labs: Recent Labs    09/12/21 1857 09/12/21 2039 09/12/21 2050  HGB 14.7  --   --   HCT 42.2  --   --   PLT 206  --   --   CREATININE  --  1.22  --   TROPONINIHS 10  --  10    Estimated Creatinine Clearance: 57.3 mL/min (by C-G formula based on SCr of 1.22 mg/dL).   Medical History: Past Medical History:  Diagnosis Date   Arthritis    BPH (benign prostatic hyperplasia)    Diabetes mellitus, type 2 (HCC)    DVT (deep venous thrombosis) ([place]) 05/12/2017   left upper leg   Emphysema of lung (HCC)    mild - on xray   Fatty liver    GERD (gastroesophageal reflux disease)    Hypertension    Kidney stone     Medications:  (Not in a hospital admission)   Assessment: Pharmacy consulted to dose heparin in this 75 year old male admitted with DVT.  CrCl = 57.3 ml/min Med rec states pt was on heparin drip at home but no longer takes, will order baseline HL to confirm.   Goal of Therapy:  Heparin level 0.3-0.7 units/ml Monitor platelets by anticoagulation protocol: Yes   Plan:  Will order  heparin 5000 units IV X 1 bolus and then start 1500 units/hr infusion.  Will order HL 8 hrs after start of drip. Will check CBC daily.   Ahmadou Bolz D 09/12/2021,11:18 PM

## 2021-09-12 NOTE — ED Provider Notes (Signed)
Emergency Medicine Provider Triage Evaluation Note  Ralph Marsh , a 75 y.o. male  was evaluated in triage.  Pt complains of right lower extremity swelling.  History of blood clots.  No significant lower extremity pain.  No trauma or injury.  Does admit to having some mild chest pain  Review of Systems  Positive: Right lower extremity swelling, chest pain Negative: Fever, trauma  Physical Exam  There were no vitals taken for this visit. Gen:   Awake, no distress   Resp:  Normal effort  MSK:   Moves extremities without difficulty mild swelling, edema right lower leg, no warmth or redness.  Severely stiff right hip with internal rotation Other:    Medical Decision Making  Medically screening exam initiated at 6:49 PM.  Appropriate orders placed.  Ralph Marsh was informed that the remainder of the evaluation will be completed by another provider, this initial triage assessment does not replace that evaluation, and the importance of remaining in the ED until their evaluation is complete.  75 year old male with right lower extremity swelling.  History of blood clots years ago.  Currently on aspirin.  Will order ultrasound right lower extremity to rule out DVT.  Also complaining of some mild chest pain will order basic blood work, troponin and EKG.   Duanne Guess, PA-C 09/12/21 1900    Arta Silence, MD 09/12/21 2250

## 2021-09-12 NOTE — ED Triage Notes (Signed)
Pt presents to ED with concerns of R upper thigh DVT. Pt does have swelling to R upper thight and leg and foot is also swollen. Pt states HX of blood clots. Mentions slight chest pain but denies SOB.

## 2021-09-13 ENCOUNTER — Other Ambulatory Visit (INDEPENDENT_AMBULATORY_CARE_PROVIDER_SITE_OTHER): Payer: Self-pay | Admitting: Vascular Surgery

## 2021-09-13 ENCOUNTER — Encounter
Admission: EM | Disposition: A | Discharge status: Home / Self Care | Payer: Self-pay | Source: Home / Self Care | Attending: Family Medicine

## 2021-09-13 ENCOUNTER — Telehealth: Payer: Self-pay

## 2021-09-13 ENCOUNTER — Encounter: Payer: Self-pay | Admitting: Family Medicine

## 2021-09-13 DIAGNOSIS — E1169 Type 2 diabetes mellitus with other specified complication: Secondary | ICD-10-CM

## 2021-09-13 DIAGNOSIS — M109 Gout, unspecified: Secondary | ICD-10-CM

## 2021-09-13 DIAGNOSIS — I959 Hypotension, unspecified: Secondary | ICD-10-CM

## 2021-09-13 DIAGNOSIS — K219 Gastro-esophageal reflux disease without esophagitis: Secondary | ICD-10-CM

## 2021-09-13 DIAGNOSIS — E785 Hyperlipidemia, unspecified: Secondary | ICD-10-CM

## 2021-09-13 DIAGNOSIS — I824Y1 Acute embolism and thrombosis of unspecified deep veins of right proximal lower extremity: Secondary | ICD-10-CM

## 2021-09-13 DIAGNOSIS — I82401 Acute embolism and thrombosis of unspecified deep veins of right lower extremity: Secondary | ICD-10-CM

## 2021-09-13 HISTORY — PX: PERIPHERAL VASCULAR THROMBECTOMY: CATH118306

## 2021-09-13 LAB — GLUCOSE, CAPILLARY
Glucose-Capillary: 113 mg/dL — ABNORMAL HIGH (ref 70–99)
Glucose-Capillary: 113 mg/dL — ABNORMAL HIGH (ref 70–99)
Glucose-Capillary: 175 mg/dL — ABNORMAL HIGH (ref 70–99)

## 2021-09-13 LAB — BASIC METABOLIC PANEL
Anion gap: 9 (ref 5–15)
BUN: 19 mg/dL (ref 8–23)
CO2: 25 mmol/L (ref 22–32)
Calcium: 8.3 mg/dL — ABNORMAL LOW (ref 8.9–10.3)
Chloride: 105 mmol/L (ref 98–111)
Creatinine, Ser: 1.06 mg/dL (ref 0.61–1.24)
GFR, Estimated: >60 mL/min (ref >60)
Glucose, Bld: 131 mg/dL — ABNORMAL HIGH (ref 70–99)
Potassium: 3.2 mmol/L — ABNORMAL LOW (ref 3.5–5.1)
Sodium: 139 mmol/L (ref 135–145)

## 2021-09-13 LAB — CBC
HCT: 38.8 % — ABNORMAL LOW (ref 39.0–52.0)
Hemoglobin: 12.9 g/dL — ABNORMAL LOW (ref 13.0–17.0)
MCH: 30 pg (ref 26.0–34.0)
MCHC: 33.2 g/dL (ref 30.0–36.0)
MCV: 90.2 fL (ref 80.0–100.0)
Platelets: 196 10*3/uL (ref 150–400)
RBC: 4.3 MIL/uL (ref 4.22–5.81)
RDW: 13 % (ref 11.5–15.5)
WBC: 8.1 10*3/uL (ref 4.0–10.5)
nRBC: 0 % (ref 0.0–0.2)

## 2021-09-13 LAB — RESP PANEL BY RT-PCR (FLU A&B, COVID) ARPGX2
Influenza A by PCR: NEGATIVE
Influenza B by PCR: NEGATIVE
SARS Coronavirus 2 by RT PCR: NEGATIVE

## 2021-09-13 LAB — HEPARIN LEVEL (UNFRACTIONATED)
Heparin Unfractionated: 0.77 IU/mL — ABNORMAL HIGH (ref 0.30–0.70)
Heparin Unfractionated: >1.1 IU/mL — ABNORMAL HIGH (ref 0.30–0.70)

## 2021-09-13 LAB — CBG MONITORING, ED
Glucose-Capillary: 158 mg/dL — ABNORMAL HIGH (ref 70–99)
Glucose-Capillary: 124 mg/dL — ABNORMAL HIGH (ref 70–99)

## 2021-09-13 LAB — APTT: aPTT: >200 seconds (ref 24–36)

## 2021-09-13 LAB — HEMOGLOBIN A1C
Hgb A1c MFr Bld: 6.7 % — ABNORMAL HIGH (ref 4.8–5.6)
Mean Plasma Glucose: 145.59 mg/dL

## 2021-09-13 LAB — PROTIME-INR
INR: 1.2 (ref 0.8–1.2)
Prothrombin Time: 15.3 seconds — ABNORMAL HIGH (ref 11.4–15.2)

## 2021-09-13 SURGERY — PERIPHERAL VASCULAR THROMBECTOMY
Anesthesia: Moderate Sedation | Laterality: Right

## 2021-09-13 MED ORDER — CLINDAMYCIN PHOSPHATE 300 MG/50ML IV SOLN
INTRAVENOUS | Status: AC
  Filled 2021-09-13: qty 50

## 2021-09-13 MED ORDER — HYDROMORPHONE HCL 1 MG/ML IJ SOLN: 1.0000 mg | Freq: Once | INTRAMUSCULAR | Status: DC | PRN

## 2021-09-13 MED ORDER — TRAZODONE HCL 50 MG PO TABS: 25.0000 mg | ORAL_TABLET | Freq: Every evening | ORAL | Status: DC | PRN

## 2021-09-13 MED ORDER — MIDAZOLAM HCL 2 MG/2ML IJ SOLN
INTRAMUSCULAR | Status: AC
  Filled 2021-09-13: qty 2

## 2021-09-13 MED ORDER — ACETAMINOPHEN 650 MG RE SUPP
650.0000 mg | Freq: Four times a day (QID) | RECTAL | Status: DC | PRN
  Filled 2021-09-13: qty 1

## 2021-09-13 MED ORDER — HEPARIN SODIUM (PORCINE) 1000 UNIT/ML IJ SOLN
INTRAMUSCULAR | Status: AC
  Filled 2021-09-13: qty 1

## 2021-09-13 MED ORDER — ONDANSETRON HCL 4 MG/2ML IJ SOLN: 4.0000 mg | Freq: Four times a day (QID) | INTRAMUSCULAR | Status: DC | PRN

## 2021-09-13 MED ORDER — VITAMIN D 25 MCG (1000 UNIT) PO TABS
1000.0000 [IU] | ORAL_TABLET | Freq: Two times a day (BID) | ORAL | Status: DC
  Administered 2021-09-13 – 2021-09-14 (×3): 1000 [IU] via ORAL
  Filled 2021-09-13 (×3): qty 1

## 2021-09-13 MED ORDER — INSULIN ASPART 100 UNIT/ML IJ SOLN
0.0000 [IU] | Freq: Three times a day (TID) | INTRAMUSCULAR | Status: DC
Start: 2021-09-13 — End: 2021-09-14
  Administered 2021-09-13: 2 [IU] via SUBCUTANEOUS
  Administered 2021-09-14: 1 [IU] via SUBCUTANEOUS
  Administered 2021-09-14: 2 [IU] via SUBCUTANEOUS
  Filled 2021-09-13 (×3): qty 1

## 2021-09-13 MED ORDER — HEPARIN (PORCINE) 25000 UT/250ML-% IV SOLN
1200.0000 [IU]/h | INTRAVENOUS | Status: DC
  Administered 2021-09-13 (×2): 1200 [IU]/h via INTRAVENOUS
  Filled 2021-09-13: qty 250

## 2021-09-13 MED ORDER — MAGNESIUM HYDROXIDE 400 MG/5ML PO SUSP: 30.0000 mL | Freq: Every day | ORAL | Status: DC | PRN

## 2021-09-13 MED ORDER — DIPHENHYDRAMINE HCL 50 MG/ML IJ SOLN: 50.0000 mg | Freq: Once | INTRAMUSCULAR | Status: DC | PRN

## 2021-09-13 MED ORDER — FENTANYL CITRATE PF 50 MCG/ML IJ SOSY
PREFILLED_SYRINGE | INTRAMUSCULAR | Status: AC
  Filled 2021-09-13: qty 1

## 2021-09-13 MED ORDER — METOPROLOL TARTRATE 25 MG PO TABS
12.5000 mg | ORAL_TABLET | Freq: Two times a day (BID) | ORAL | Status: DC
  Administered 2021-09-14: 12.5 mg via ORAL
  Filled 2021-09-13 (×2): qty 1

## 2021-09-13 MED ORDER — HEPARIN SODIUM (PORCINE) 1000 UNIT/ML IJ SOLN
INTRAMUSCULAR | Status: DC | PRN
  Administered 2021-09-13: 3000 [IU] via INTRAVENOUS

## 2021-09-13 MED ORDER — FAMOTIDINE 20 MG PO TABS: 40.0000 mg | ORAL_TABLET | Freq: Once | ORAL | Status: DC | PRN

## 2021-09-13 MED ORDER — ONDANSETRON HCL 4 MG PO TABS: 4.0000 mg | ORAL_TABLET | Freq: Four times a day (QID) | ORAL | Status: DC | PRN

## 2021-09-13 MED ORDER — SODIUM CHLORIDE 0.9 % IV SOLN: INTRAVENOUS | Status: AC

## 2021-09-13 MED ORDER — HEPARIN (PORCINE) 25000 UT/250ML-% IV SOLN
1200.0000 [IU]/h | INTRAVENOUS | Status: DC
  Administered 2021-09-13: 1200 [IU]/h via INTRAVENOUS
  Filled 2021-09-13: qty 250

## 2021-09-13 MED ORDER — METHYLPREDNISOLONE SODIUM SUCC 125 MG IJ SOLR: 125.0000 mg | Freq: Once | INTRAMUSCULAR | Status: DC | PRN

## 2021-09-13 MED ORDER — MIDAZOLAM HCL 2 MG/ML PO SYRP: 8.0000 mg | ORAL_SOLUTION | Freq: Once | ORAL | Status: DC | PRN

## 2021-09-13 MED ORDER — MIDAZOLAM HCL 2 MG/2ML IJ SOLN
INTRAMUSCULAR | Status: DC | PRN
  Administered 2021-09-13 (×5): 1 mg via INTRAVENOUS

## 2021-09-13 MED ORDER — MIDAZOLAM HCL 5 MG/5ML IJ SOLN
INTRAMUSCULAR | Status: AC
  Filled 2021-09-13: qty 5

## 2021-09-13 MED ORDER — ALTEPLASE 1 MG/ML SYRINGE FOR VASCULAR PROCEDURE
INTRAMUSCULAR | Status: DC | PRN
  Administered 2021-09-13: 10 mg via INTRA_ARTERIAL

## 2021-09-13 MED ORDER — CLINDAMYCIN PHOSPHATE 300 MG/50ML IV SOLN
300.0000 mg | Freq: Once | INTRAVENOUS | Status: DC
  Filled 2021-09-13: qty 50

## 2021-09-13 MED ORDER — ASPIRIN EC 81 MG PO TBEC
81.0000 mg | DELAYED_RELEASE_TABLET | Freq: Every day | ORAL | Status: DC
  Administered 2021-09-13 – 2021-09-14 (×2): 81 mg via ORAL
  Filled 2021-09-13 (×2): qty 1

## 2021-09-13 MED ORDER — DEXTROSE 5 % IV SOLN
INTRAVENOUS | Status: DC | PRN
  Administered 2021-09-13: 300 mg via INTRAVENOUS

## 2021-09-13 MED ORDER — SODIUM CHLORIDE 0.9 % IV SOLN: INTRAVENOUS | Status: DC

## 2021-09-13 MED ORDER — PANTOPRAZOLE SODIUM 40 MG PO TBEC
40.0000 mg | DELAYED_RELEASE_TABLET | Freq: Every day | ORAL | Status: DC
  Administered 2021-09-13 – 2021-09-14 (×2): 40 mg via ORAL
  Filled 2021-09-13 (×2): qty 1

## 2021-09-13 MED ORDER — ACETAMINOPHEN 325 MG PO TABS: 650.0000 mg | ORAL_TABLET | Freq: Four times a day (QID) | ORAL | Status: DC | PRN

## 2021-09-13 MED ORDER — ALLOPURINOL 100 MG PO TABS
300.0000 mg | ORAL_TABLET | Freq: Every day | ORAL | Status: DC
  Administered 2021-09-13 – 2021-09-14 (×2): 300 mg via ORAL
  Filled 2021-09-13: qty 1
  Filled 2021-09-13: qty 3

## 2021-09-13 MED ORDER — FENTANYL CITRATE (PF) 100 MCG/2ML IJ SOLN
INTRAMUSCULAR | Status: DC | PRN
  Administered 2021-09-13 (×4): 25 ug via INTRAVENOUS

## 2021-09-13 SURGICAL SUPPLY — 17 items
BALLN DORADO 8X100X80 (BALLOONS) ×3
BALLN ULTRVRSE 12X80X75 (BALLOONS) ×3
BALLOON DORADO 8X100X80 (BALLOONS) ×1 IMPLANT
BALLOON ULTRVRSE 12X80X75 (BALLOONS) ×1 IMPLANT
CANISTER PENUMBRA ENGINE (MISCELLANEOUS) ×3 IMPLANT
CATH BEACON 5 .035 65 KMP TIP (CATHETERS) ×3 IMPLANT
CATH INDIGO 12XTORQ 100 (CATHETERS) ×3 IMPLANT
CATH INDIGO SEP 12 (CATHETERS) ×3 IMPLANT
COVER PROBE U/S 5X48 (MISCELLANEOUS) ×3 IMPLANT
GOWN SRG XL LVL 3 NONREINFORCE (GOWNS) ×2 IMPLANT
GOWN STRL NON-REIN TWL XL LVL3 (GOWNS) ×4
KIT ENCORE 26 ADVANTAGE (KITS) ×3 IMPLANT
NEEDLE ENTRY 21GA 7CM ECHOTIP (NEEDLE) ×3 IMPLANT
PACK ANGIOGRAPHY (CUSTOM PROCEDURE TRAY) ×3 IMPLANT
SET INTRO CAPELLA COAXIAL (SET/KITS/TRAYS/PACK) ×3 IMPLANT
SHEATH PINNACLE 11FRX10 (SHEATH) ×3 IMPLANT
WIRE MAGIC TOR.035 180C (WIRE) ×6 IMPLANT

## 2021-09-13 NOTE — Telephone Encounter (Signed)
Returning call from [place] is having surgery to remove a DVT today.  I explained that he would need clearance to return to exercise from his Dr. Deborha Payment will call when he is able to return.

## 2021-09-13 NOTE — Interval H&P Note (Signed)
History and Physical Interval Note:  09/13/2021 3:04 PM  Ralph Marsh  has presented today for surgery, with the diagnosis of DVT right leg.  The various methods of treatment have been discussed with the patient and family. After consideration of risks, benefits and other options for treatment, the patient has consented to  Procedure(s): PERIPHERAL VASCULAR THROMBECTOMY (Right) as a surgical intervention.  The patient's history has been reviewed, patient examined, no change in status, stable for surgery.  I have reviewed the patient's chart and labs.  Questions were answered to the patient's satisfaction.     Hortencia Pilar

## 2021-09-13 NOTE — TOC Initial Note (Signed)
Transition of Care [facility]) - Initial/Assessment Note    Patient Details  Name: Ralph Marsh MRN: [id number] Date of Birth: 05-18-1946  Transition of Care [facility]) CM/SW Contact:    Ova Freshwater Phone Number: [phone] 09/13/2021, 11:13 AM  Clinical Narrative:                  Patient presents to [facility] from due to concerns of right upper thigh DVT.  Patient's main contact is Kielbasa,Joyce (Spouse) [phone].  CSW spoke with patient and spouse.  Patient is independent with ADLs and ambulation.  Patient is active with [facility] and [facility] PA, is PCP. CSW dicussed Eliquis access and coupon.  CSW explained Eliquis coupon is only available once, and if there were concerns about price of medication, they would need to contact the Eliquis manufacturer directly. Patient and Ms. Podgorski verbalized understanding.  Patient does not have any other needs at this time.  Attending and ED RN updated.  Expected Discharge Plan: Home/Self Care Barriers to Discharge: Continued Medical Work up   Patient Goals and CMS Choice        Expected Discharge Plan and Services Expected Discharge Plan: Home/Self Care In-house Referral: Clinical Social Work   Post Acute Care Choice: NA Living arrangements for the past 2 months: Single Family Home                                      Prior Living Arrangements/Services Living arrangements for the past 2 months: Single Family Home Lives with:: Spouse (Mcvicar,Joyce (Spouse)   [phone] (Mobile)) Patient language and need for interpreter reviewed:: Yes Do you feel safe going back to the place where you live?: Yes      Need for Family Participation in Patient Care: Yes (Comment) Care giver support system in place?: Yes (comment)   Criminal Activity/Legal Involvement Pertinent to Current Situation/Hospitalization: No - Comment as needed  Activities of Daily Living      Permission Sought/Granted Permission sought to share information with  : Family Supports Permission granted to share information with : Yes, Verbal Permission Granted  Share Information with NAME: [place],Joyce (Spouse)   [phone] (Mobile)           Emotional Assessment Appearance:: Appears stated age Attitude/Demeanor/Rapport: Engaged Affect (typically observed): Stable Orientation: : Oriented to Self, Oriented to Place, Oriented to  Time, Oriented to Situation Alcohol / Substance Use: Not Applicable Psych Involvement: No (comment)  Admission diagnosis:  Right leg DVT ([place]) [I82.401] Patient Active Problem List   Diagnosis Date Noted   Right leg DVT ([place]) 09/12/2021   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   CHF (congestive heart failure), NYHA class I, acute, diastolic ([place]) 75/88/3254   CAD (coronary artery disease) 05/18/2021   DVT (deep venous thrombosis) ([place]) 05/18/2021   NSTEMI (non-ST elevated myocardial infarction) ([place]) 05/14/2021   Long term current use of anticoagulant 05/14/2021   Gout 05/14/2021   Other intervertebral disc degeneration, lumbosacral region 11/24/2019   Essential hypertension 06/04/2017   GERD (gastroesophageal reflux disease) 06/04/2017   Hyperlipidemia 06/04/2017   Acute embolism and thrombosis of deep vein of left lower extremity ([place]) 05/15/2017   Acute deep vein thrombosis (DVT) of femoral vein of left lower extremity ([place]) 05/13/2017   Kidney stones 02/01/2017   Obstructive uropathy 01/20/2017   PVC's (premature ventricular contractions) 11/24/2016   Type 2 diabetes mellitus without complications ([place]) 98/26/4158  PCP:  Marguerita Merles, MD Pharmacy:   [place], [place] [address] [place] [address] Phone: [phone] Fax: [place], [place] [address] [place] [address] Phone: [phone] Fax: [phone]     Social Determinants of Health (SDOH) Interventions    Readmission Risk Interventions No flowsheet  data found.

## 2021-09-13 NOTE — Progress Notes (Signed)
Patient ID: Ralph Marsh, male   DOB: 06/30/1946, 75 y.o.   MRN: [id number] [facility] PROGRESS NOTE  Ralph Marsh LEX:[id number] DOB: Jul 26, 1946 DOA: 09/12/2021 PCP: Marguerita Merles, MD  HPI/Subjective: Patient came in with swelling and pain in his right leg.  Found to have extensive DVT.  History of DVT in the past.  No complaints of chest pain or shortness of breath.  Objective: Vitals:   09/13/21 1100 09/13/21 1200  BP: 109/74 104/72  Pulse: 84 80  Resp: 14 16  Temp:    SpO2: 94% 94%    Intake/Output Summary (Last 24 hours) at 09/13/2021 1243 Last data filed at 09/13/2021 1050 Gross per 24 hour  Intake 1000 ml  Output --  Net 1000 ml   Filed Weights   09/12/21 1904  Weight: 88 kg    ROS: Review of Systems  Respiratory:  Negative for shortness of breath.   Cardiovascular:  Negative for chest pain.  Gastrointestinal:  Negative for abdominal pain, nausea and vomiting.  Exam: Physical Exam HENT:     Head: Normocephalic.     Mouth/Throat:     Pharynx: No oropharyngeal exudate.  Eyes:     General: Lids are normal.     Conjunctiva/sclera: Conjunctivae normal.  Cardiovascular:     Rate and Rhythm: Normal rate and regular rhythm.     Heart sounds: Normal heart sounds, S1 normal and S2 normal.  Pulmonary:     Breath sounds: Normal breath sounds. No decreased breath sounds, wheezing, rhonchi or rales.  Abdominal:     Palpations: Abdomen is soft.     Tenderness: There is no abdominal tenderness.  Musculoskeletal:     Right lower leg: Swelling present.     Left lower leg: No swelling.  Skin:    General: Skin is warm.     Findings: No rash.  Neurological:     Mental Status: He is alert and oriented to person, place, and time.      Scheduled Meds:  allopurinol  300 mg Oral Daily   aspirin EC  81 mg Oral Daily   cholecalciferol  1,000 Units Oral BID   insulin aspart  0-9 Units Subcutaneous TID AC & HS   metoprolol tartrate  12.5 mg Oral BID    pantoprazole  40 mg Oral Daily   Continuous Infusions:  sodium chloride Stopped (09/13/21 1050)   heparin 1,200 Units/hr (09/13/21 1200)    Assessment/Plan:  Extensive right leg DVT from common femoral vein to the popliteal vein.  Patient will have vascular surgery procedure today for thrombectomy and thrombolysis.  Currently on heparin drip. Relative hypotension.  Holding parameters on metoprolol.   Type 2 diabetes mellitus with hyperlipidemia.  Hold Glucophage for now. History of gout on allopurinol GERD on Protonix      Code Status:     Code Status Orders  (From admission, onward)           Start     Ordered   09/13/21 0002  Full code  Continuous        09/13/21 0007           Code Status History     Date Active Date Inactive Code Status Order ID Comments User Context   05/14/2021 1959 05/18/2021 1931 Full Code [id number]  Clarnce Flock, MD ED   05/13/2017 0201 05/13/2017 1856 Full Code [id number]  Harvie Bridge, DO Inpatient   01/20/2017 0541 01/21/2017 1706 Full Code [id number]  Saundra Shelling, MD  Inpatient      Family Communication: Spoke with wife at the bedside Disposition Plan: Status is: Inpatient  Consultants: Vascular surgery  Time spent: 28 minutes  [place]

## 2021-09-13 NOTE — ED Notes (Signed)
Pharmacy called. Due to pt hep level. Stop heparin and hold for an hour then redraw hep level.

## 2021-09-13 NOTE — Progress Notes (Signed)
ANTICOAGULATION CONSULT NOTE - Initial Consult  Pharmacy Consult for Heparin  Indication: DVT  Allergies  Allergen Reactions   Augmentin [Amoxicillin-Pot Clavulanate] Anaphylaxis   Latex Other (See Comments)    Nasal inflammation   Other Anaphylaxis   Solifenacin Other (See Comments)   Sulfa Antibiotics Anaphylaxis   Rosuvastatin     Other reaction(s): Muscle pain   Atorvastatin     Other reaction(s): Muscle Pain, Pain in lower limb   Omeprazole     Other reaction(s): Heartburn   Pravastatin     Other reaction(s): Muscle pain   Simvastatin Other (See Comments)    Other reaction(s): Fatigue, Muscle pain    Patient Measurements: Height: 6\' 1"  (185.4 cm) Weight: 88 kg (194 lb) IBW/kg (Calculated) : 79.9 Heparin Dosing Weight: 88 kg   Vital Signs: BP: 109/75 (11/08 0900) Pulse Rate: 78 (11/08 0900)  Labs: Recent Labs    09/12/21 1857 09/12/21 2039 09/12/21 2050 09/13/21 0022 09/13/21 0618 09/13/21 0937  HGB 14.7  --   --   --  12.9*  --   HCT 42.2  --   --   --  38.8*  --   PLT 206  --   --   --  196  --   APTT  --   --   --  >200*  --   --   LABPROT  --   --   --  15.3*  --   --   INR  --   --   --  1.2  --   --   HEPARINUNFRC  --   --   --  0.77*  --  >1.10*  CREATININE  --  1.22  --   --  1.06  --   TROPONINIHS 10  --  10  --   --   --      Estimated Creatinine Clearance: 66 mL/min (by C-G formula based on SCr of 1.06 mg/dL).   Medical History: Past Medical History:  Diagnosis Date   Arthritis    BPH (benign prostatic hyperplasia)    Diabetes mellitus, type 2 (HCC)    DVT (deep venous thrombosis) ([place]) 05/12/2017   left upper leg   Emphysema of lung (HCC)    mild - on xray   Fatty liver    GERD (gastroesophageal reflux disease)    Hypertension    Kidney stone     Medications:  (Not in a hospital admission)  Assessment: Pharmacy consulted to dose heparin in this 75 year old male admitted with DVT.  CrCl = 57.3 ml/min. Med rec states pt was  on heparin drip at home but no longer takes, will order baseline HL to confirm.   11/8 0937 HL >1.10 supratherapeutic - spoke with nurse who drew level who stated she flushed the line several times and obtained "empty" draws prior th HL draw  Goal of Therapy:  Heparin level 0.3-0.7 units/ml Monitor platelets by anticoagulation protocol: Yes   Plan:  HL supratherapeutic. Will hold infusion for 1 hr and decrease rate to 1200 units/hr infusion.  Will order HL 8 hrs after rate change Will check CBC daily.   Jonesha Tsuchiya O Benno Brensinger 09/13/2021,10:16 AM

## 2021-09-13 NOTE — H&P (Signed)
[place]   PATIENT NAME: Connery Shiffler    MR#:  [id number]  DATE OF BIRTH:  15-Jun-1946  DATE OF ADMISSION:  09/12/2021  PRIMARY CARE PHYSICIAN: Marguerita Merles, MD   Patient is coming from: Home  REQUESTING/REFERRING PHYSICIAN: Marjean Donna, MD  CHIEF COMPLAINT:   Chief Complaint  Patient presents with  . Leg Pain    HISTORY OF PRESENT ILLNESS:  AROLDO GALLI is a 75 y.o. Caucasian male with medical history significant for type 2 diabetes mellitus, BPH, GERD, hypertension, nephrolithiasis and emphysema, who presented to the emergency room with acute onset of right leg swelling and pain over the last couple of days with current difficulty with ambulation using a walker.  He has been feeling generally weak.  He denies any chest pain.  He has been having cough and dyspnea worsening with exertion.  No fever or chills.  He denies any bleeding diathesis.  He denies any recent travels or surgeries.  He has been fairly inactive recently though due to recent bronchitis and feeling sick.  He had a DVT in 2018 in the left leg and took Eliquis for 8 months and stopped.  He was told at that time it could be secondary to lithotripsy.  ED Course: When he came to the ER, vital signs were within normal.  Labs revealed a creatinine of 1.22 and albumin of 2.8 with total protein of 6.4.  High-sensitivity troponin I was 10 and later the same.  CBC was unremarkable.  Respiratory panel is currently pending.   EKG as reviewed by me : EKG showed normal sinus rhythm with a rate of 97 Imaging: Right lower extremity venous Doppler showed extensive occlusive DVT extending from the common femoral vein to the popliteal vein.  Chest CTA showed no evidence for PE or aortic dissection.  Lungs were clear.  Showed gallstones.  The patient was given IV heparin bolus and drip.  He will be admitted to a medical bed for further evaluation and management. PAST MEDICAL HISTORY:   Past Medical History:  Diagnosis  Date  . Arthritis   . BPH (benign prostatic hyperplasia)   . Diabetes mellitus, type 2 ([place])   . DVT (deep venous thrombosis) ([place]) 05/12/2017   left upper leg  . Emphysema of lung ([place])    mild - on xray  . Fatty liver   . GERD (gastroesophageal reflux disease)   . Hypertension   . Kidney stone     PAST SURGICAL HISTORY:   Past Surgical History:  Procedure Laterality Date  . CATARACT EXTRACTION W/PHACO Right 09/11/2018   Procedure: CATARACT EXTRACTION PHACO AND INTRAOCULAR LENS PLACEMENT ([place])  RIGHT DIABETIC;  Surgeon: Leandrew Koyanagi, MD;  Location: [facility];  Service: Ophthalmology;  Laterality: Right;  Diabetic - oral meds Latex sensitiviy  . CATARACT EXTRACTION W/PHACO Left 10/09/2018   Procedure: CATARACT EXTRACTION PHACO AND INTRAOCULAR LENS PLACEMENT ([place])  LEFT DIABETIC;  Surgeon: Leandrew Koyanagi, MD;  Location: [facility];  Service: Ophthalmology;  Laterality: Left;  Diabetic - oral meds Latex sensitivity  . COLONOSCOPY WITH PROPOFOL N/A 07/28/2019   Procedure: COLONOSCOPY WITH PROPOFOL;  Surgeon: Toledo, Benay Pike, MD;  Location: [facility];  Service: Gastroenterology;  Laterality: N/A;  . EXTRACORPOREAL SHOCK WAVE LITHOTRIPSY Left 01/25/2017   Procedure: EXTRACORPOREAL SHOCK WAVE LITHOTRIPSY (ESWL);  Surgeon: Nickie Retort, MD;  Location: [facility];  Service: Urology;  Laterality: Left;  . LASER OF PROSTATE W/ GREEN LIGHT PVP  2008  Dr. Quillian Quince  . LEFT HEART CATH AND CORONARY ANGIOGRAPHY N/A 05/16/2021   Procedure: LEFT HEART CATH AND CORONARY ANGIOGRAPHY;  Surgeon: Corey Skains, MD;  Location: [facility];  Service: Cardiovascular;  Laterality: N/A;  . LUMBAR LAMINECTOMY    . TONSILLECTOMY AND ADENOIDECTOMY    . URETEROSCOPY WITH HOLMIUM LASER LITHOTRIPSY  2004    SOCIAL HISTORY:   Social History   Tobacco Use  . Smoking status: Former    Packs/day: 1.00    Years: 15.00    Pack years: 15.00    Types: Cigarettes     Quit date: 1995    Years since quitting: 27.8  . Smokeless tobacco: Never  Substance Use Topics  . Alcohol use: Yes    Alcohol/week: 0.0 standard drinks    Comment: Holidays only    FAMILY HISTORY:   Family History  Problem Relation Age of Onset  . Kidney disease Neg Hx   . Kidney cancer Neg Hx   . Prostate cancer Neg Hx     DRUG ALLERGIES:   Allergies  Allergen Reactions  . Augmentin [Amoxicillin-Pot Clavulanate] Anaphylaxis  . Latex Other (See Comments)    Nasal inflammation  . Other Anaphylaxis  . Solifenacin Other (See Comments)  . Sulfa Antibiotics Anaphylaxis  . Rosuvastatin     Other reaction(s): Muscle pain  . Atorvastatin     Other reaction(s): Muscle Pain, Pain in lower limb  . Omeprazole     Other reaction(s): Heartburn  . Pravastatin     Other reaction(s): Muscle pain  . Simvastatin Other (See Comments)    Other reaction(s): Fatigue, Muscle pain    REVIEW OF SYSTEMS:   ROS As per history of present illness. All pertinent systems were reviewed above. Constitutional, HEENT, cardiovascular, respiratory, GI, GU, musculoskeletal, neuro, psychiatric, endocrine, integumentary and hematologic systems were reviewed and are otherwise negative/unremarkable except for positive findings mentioned above in the HPI.   MEDICATIONS AT HOME:   Prior to Admission medications   Medication Sig Start Date End Date Taking? Authorizing Provider  allopurinol (ZYLOPRIM) 300 MG tablet Take 1 tablet by mouth daily. Patient not taking: Reported on 06/29/2021 11/20/18   [provider]  aspirin EC 81 MG tablet Take 81 mg by mouth daily.    [provider]  Cholecalciferol 1000 UNITS tablet Take 1,000 Units by mouth 2 (two) times daily.    [provider]  esomeprazole (NEXIUM) 40 MG capsule Take 1 capsule by mouth daily. 10/24/19   [provider]  Eszopiclone 3 MG TABS  06/07/21   [provider]  heparin 25000 UT/250ML infusion  Inject 1,300 Units/hr into the vein continuous. Patient not taking: Reported on 06/29/2021 05/18/21   Sharen Hones, MD  insulin detemir (LEVEMIR) 100 UNIT/ML injection Inject 0.14 mLs (14 Units total) into the skin at bedtime. Patient not taking: Reported on 06/29/2021 05/18/21   Sharen Hones, MD  ketorolac (TORADOL) 10 MG tablet Take 1 tablet (10 mg total) by mouth every 6 (six) hours as needed for moderate pain or severe pain. Patient not taking: Reported on 06/29/2021 03/14/21   Hinda Kehr, MD  lisinopril (ZESTRIL) 20 MG tablet Take 10 mg by mouth daily. Patient not taking: Reported on 06/29/2021 11/26/20   [provider]  metFORMIN (GLUCOPHAGE) 500 MG tablet Take by mouth. 05/24/21   [provider]  metoprolol tartrate (LOPRESSOR) 25 MG tablet Take 0.5 tablets (12.5 mg total) by mouth 2 (two) times daily. 05/18/21   Roosevelt Locks,  Dekui, MD  morphine (MS CONTIN) 30 MG 12 hr tablet Take 30 mg by mouth 2 (two) times daily. Patient not taking: Reported on 06/29/2021    [provider]  morphine (MSIR) 15 MG tablet Take 15 mg by mouth 3 (three) times daily as needed. Patient not taking: Reported on 06/29/2021    [provider]  nitroGLYCERIN (NITROSTAT) 0.4 MG SL tablet Place 1 tablet (0.4 mg total) under the tongue every 5 (five) minutes x 3 doses as needed for chest pain. Patient not taking: Reported on 06/29/2021 05/18/21   Sharen Hones, MD      VITAL SIGNS:  Blood pressure 122/80, pulse 87, temperature 98 F (36.7 C), temperature source Oral, resp. rate 18, height 6\' 1"  (1.854 m), weight 88 kg, SpO2 95 %.  PHYSICAL EXAMINATION:  Physical Exam  GENERAL:  75 y.o.-year-old Caucasian male patient lying in the bed with no acute distress.  EYES: Pupils equal, round, reactive to light and accommodation. No scleral icterus. Extraocular muscles intact.  HEENT: Head atraumatic, normocephalic. Oropharynx and nasopharynx clear.  NECK:  Supple, no jugular venous distention. No  thyroid enlargement, no tenderness.  LUNGS: Normal breath sounds bilaterally, no wheezing, rales,rhonchi or crepitation. No use of accessory muscles of respiration.  CARDIOVASCULAR: Regular rate and rhythm, S1, S2 normal. No murmurs, rubs, or gallops.  ABDOMEN: Soft, nondistended, nontender. Bowel sounds present. No organomegaly or mass.  EXTREMITIES: Right medial thigh swelling with mild tenderness without erythema.  No cyanosis, or clubbing.  Negative Homans' sign. NEUROLOGIC: Cranial nerves II through XII are intact. Muscle strength 5/5 in all extremities. Sensation intact. Gait not checked.  PSYCHIATRIC: The patient is alert and oriented x 3.  Normal affect and good eye contact. SKIN: No obvious rash, lesion, or ulcer.   LABORATORY PANEL:   CBC Recent Labs  Lab 09/12/21 1857  WBC 9.3  HGB 14.7  HCT 42.2  PLT 206   ------------------------------------------------------------------------------------------------------------------  Chemistries  Recent Labs  Lab 09/12/21 2039  NA 137  K 3.6  CL 105  CO2 24  GLUCOSE 144*  BUN 18  CREATININE 1.22  CALCIUM 8.4*  AST 18  ALT 21  ALKPHOS 107  BILITOT 0.7   ------------------------------------------------------------------------------------------------------------------  Cardiac Enzymes No results for input(s): TROPONINI in the last 168 hours. ------------------------------------------------------------------------------------------------------------------  RADIOLOGY:  CT Angio Chest PE W and/or Wo Contrast  Result Date: 09/12/2021 CLINICAL DATA:  Lower extremity swelling EXAM: CT ANGIOGRAPHY CHEST WITH CONTRAST TECHNIQUE: Multidetector CT imaging of the chest was performed using the standard protocol during bolus administration of intravenous contrast. Multiplanar CT image reconstructions and MIPs were obtained to evaluate the vascular anatomy. CONTRAST:  74mL OMNIPAQUE IOHEXOL 350 MG/ML SOLN COMPARISON:  Chest CT 05/14/2021  FINDINGS: Cardiovascular: Satisfactory opacification of the pulmonary arteries to the segmental level. No evidence of pulmonary embolism. Normal heart size. No pericardial effusion. Post CABG changes. Coronary vascular calcification Mediastinum/Nodes: Midline trachea. No thyroid mass. No suspicious adenopathy. Esophagus within normal limits. Fat containing hiatal hernia. Lungs/Pleura: Lungs are clear. No pleural effusion or pneumothorax. Upper Abdomen: No acute abnormality.  Gallstones. Musculoskeletal: No chest wall abnormality. No acute or significant osseous findings. Review of the MIP images confirms the above findings. IMPRESSION: 1. Negative for acute pulmonary embolus. Negative for aortic dissection. 2. Clear lung fields 3. Gallstones Aortic Atherosclerosis (ICD10-I70.0). Electronically Signed   By: Donavan Foil M.D.   On: 09/12/2021 22:51   US Venous Img Lower Unilateral Right  Result Date: 09/12/2021 CLINICAL DATA:  Right leg  swelling EXAM: RIGHT LOWER EXTREMITY VENOUS DOPPLER ULTRASOUND TECHNIQUE: Gray-scale sonography with graded compression, as well as color Doppler and duplex ultrasound were performed to evaluate the lower extremity deep venous systems from the level of the common femoral vein and including the common femoral, femoral, profunda femoral, popliteal and calf veins including the posterior tibial, peroneal and gastrocnemius veins when visible. The superficial great saphenous vein was also interrogated. Spectral Doppler was utilized to evaluate flow at rest and with distal augmentation maneuvers in the common femoral, femoral and popliteal veins. COMPARISON:  None. FINDINGS: Contralateral Common Femoral Vein: No evidence of thrombus. Normal compressibility and flow on color Doppler imaging. Common Femoral Vein: Occlusive thrombus. Vessel is noncompressible. Saphenofemoral Junction: Occlusive thrombus. Vessel is noncompressible. Profunda Femoral Vein: Occlusive thrombus. Vessel is  noncompressible. Femoral Vein: Occlusive thrombus. Vessel is noncompressible. Popliteal Vein: Occlusive thrombus. Vessel is noncompressible. Calf Veins: No evidence of thrombus. Normal compressibility and flow on color Doppler imaging. Superficial Great Saphenous Vein: No evidence of thrombus. Normal compressibility. Venous Reflux:  None. Other Findings:  None. IMPRESSION: Extensive occlusive deep venous thrombosis within the right lower extremity extending from the common femoral vein to the popliteal vein. These results were called by telephone at the time of interpretation on 09/12/2021 at 8:30 pm to provider Dr Jari Pigg, who verbally acknowledged these results. Electronically Signed   By: Davina Poke D.O.   On: 09/12/2021 20:30      IMPRESSION AND PLAN:  Active Problems:   Right leg DVT ([place])  1.  Extensive right lower extremity DVT with previous history of left leg DVT. - The patient will be admitted to a medical bed. - We will continue him on IV heparin. - Pain management to be provided. - He will likely need long-term Eliquis.  2.  Type 2 diabetes mellitus. - The patient will be placed on supplement coverage with NovoLog and will continue his basal coverage. - We will hold off his metformin.  3.  Essential hypertension. - We will continue Lopressor.  4.  Gout. - We will continue allopurinol.  5.  Coronary artery disease. - Continue has baby aspirin as well as beta-blocker therapy.  6.  GERD. - We will continue PPI therapy.  DVT prophylaxis: IV heparin. Code Status: full code. Family Communication:  The plan of care was discussed in details with the patient (and family). I answered all questions. The patient agreed to proceed with the above mentioned plan. Further management will depend upon hospital course. Disposition Plan: Back to previous home environment Consults called: none. All the records are reviewed and case discussed with ED provider.  Status is:  Inpatient  Remains inpatient appropriate because:Ongoing diagnostic testing needed not appropriate for outpatient work up, Unsafe d/c plan, IV treatments appropriate due to intensity of illness or inability to take PO, and Inpatient level of care appropriate due to severity of illness   Dispo: The patient is from: Home              Anticipated d/c is to: Home              Patient currently is not medically stable to d/c.              Difficult to place patient: No  TOTAL TIME TAKING CARE OF THIS PATIENT: 55 minutes.     Christel Mormon M.D on 09/13/2021 at 12:08 AM  [facility]   From 7 PM-7 AM, contact night-coverage [email or web address]  CC: Primary care physician; Lennox Grumbles,  Connye Burkitt, MD

## 2021-09-13 NOTE — ED Notes (Signed)
Rn to bedside. Pt sleeping.

## 2021-09-13 NOTE — Progress Notes (Signed)
ANTICOAGULATION CONSULT NOTE - Initial Consult  Pharmacy Consult for Heparin  Indication: DVT  Allergies  Allergen Reactions   Augmentin [Amoxicillin-Pot Clavulanate] Anaphylaxis   Latex Other (See Comments)    Nasal inflammation   Other Anaphylaxis   Solifenacin Other (See Comments)   Sulfa Antibiotics Anaphylaxis   Rosuvastatin     Other reaction(s): Muscle pain   Atorvastatin     Other reaction(s): Muscle Pain, Pain in lower limb   Omeprazole     Other reaction(s): Heartburn   Pravastatin     Other reaction(s): Muscle pain   Simvastatin Other (See Comments)    Other reaction(s): Fatigue, Muscle pain    Patient Measurements: Height: 6\' 1"  (185.4 cm) Weight: 88 kg (194 lb) IBW/kg (Calculated) : 79.9 Heparin Dosing Weight: 88 kg   Vital Signs: Temp: 98.7 F (37.1 C) (11/08 1430) Temp Source: Oral (11/08 1430) BP: 110/73 (11/08 1700) Pulse Rate: 88 (11/08 1700)  Labs: Recent Labs    09/12/21 1857 09/12/21 2039 09/12/21 2050 09/13/21 0022 09/13/21 0618 09/13/21 0937  HGB 14.7  --   --   --  12.9*  --   HCT 42.2  --   --   --  38.8*  --   PLT 206  --   --   --  196  --   APTT  --   --   --  >200*  --   --   LABPROT  --   --   --  15.3*  --   --   INR  --   --   --  1.2  --   --   HEPARINUNFRC  --   --   --  0.77*  --  >1.10*  CREATININE  --  1.22  --   --  1.06  --   TROPONINIHS 10  --  10  --   --   --      Estimated Creatinine Clearance: 66 mL/min (by C-G formula based on SCr of 1.06 mg/dL).   Medical History: Past Medical History:  Diagnosis Date   Arthritis    BPH (benign prostatic hyperplasia)    Diabetes mellitus, type 2 (HCC)    DVT (deep venous thrombosis) ([place]) 05/12/2017   left upper leg   Emphysema of lung (HCC)    mild - on xray   Fatty liver    GERD (gastroesophageal reflux disease)    Hypertension    Kidney stone     Medications:  Medications Prior to Admission  Medication Sig Dispense Refill Last Dose   allopurinol  (ZYLOPRIM) 300 MG tablet Take 1 tablet by mouth daily.   09/12/2021   aspirin EC 81 MG tablet Take 81 mg by mouth daily.   09/12/2021   Cholecalciferol 1000 UNITS tablet Take 1,000 Units by mouth 2 (two) times daily.   09/12/2021   esomeprazole (NEXIUM) 40 MG capsule Take 1 capsule by mouth daily.   09/12/2021   Eszopiclone 3 MG TABS    09/12/2021   furosemide (LASIX) 20 MG tablet Take 40 mg by mouth daily at 12 noon.   09/12/2021   metFORMIN (GLUCOPHAGE) 500 MG tablet Take 1,000 mg by mouth 2 (two) times daily with a meal.   09/12/2021   metoprolol tartrate (LOPRESSOR) 25 MG tablet Take 0.5 tablets (12.5 mg total) by mouth 2 (two) times daily. (Patient taking differently: Take 50 mg by mouth 2 (two) times daily.)   09/12/2021   potassium chloride SA (KLOR-CON) 20  MEQ tablet Take 20 mEq by mouth in the morning.   09/12/2021   PRALUENT 150 MG/ML SOAJ Inject into the skin.   09/12/2021   heparin 25000 UT/250ML infusion Inject 1,300 Units/hr into the vein continuous. (Patient not taking: No sig reported)       Assessment: Pharmacy consulted to dose heparin in this 75 year old male admitted with DVT.  CrCl = 57.3 ml/min. Med rec states pt was on heparin drip at home but no longer takes, will order baseline HL to confirm.   11/8 0937 HL >1.10 supratherapeutic - spoke with nurse who drew level who stated she flushed the line several times and obtained "empty" draws prior th HL draw  Goal of Therapy:  Heparin level 0.3-0.7 units/ml Monitor platelets by anticoagulation protocol: Yes   Plan:  Pt went for thrombectomy 11/8. Per MD restart heparin at previous rate of 1200 units/hr with no bolus starting at 1800. Okay to follow nomogram thereafter. Recheck HL 8 hrs after restart Will check CBC daily.   Burel Kahre O Kristiana Jacko 09/13/2021,5:06 PM

## 2021-09-13 NOTE — ED Notes (Signed)
Pt prescription is to be faxed to [facility] upon discharge.   Brett Canales, [place]

## 2021-09-13 NOTE — H&P (View-Only) (Signed)
[facility] VASCULAR & VEIN SPECIALISTS Vascular Consult Note  MRN : [id number]  Ralph Marsh is a 75 y.o. (May 13, 1946) male who presents with chief complaint of  Chief Complaint  Patient presents with   Leg Pain   History of Present Illness: Ralph Marsh is a 75 year old caucasian male with medical history significant for type 2 diabetes mellitus, BPH, GERD, hypertension, nephrolithiasis and emphysema, who presented to the emergency room with acute onset of right leg swelling and pain over the last couple of days with current difficulty with ambulation using a walker.    He has been feeling generally weak.  He denies any chest pain.  He has been having cough and dyspnea worsening with exertion.  No fever or chills.  He denies any bleeding diathesis.  He denies any recent travels or surgeries.  He has been fairly inactive recently though due to recent bronchitis and feeling sick.  He had a DVT in 2018 in the left leg and took Eliquis for 8 months and stopped.  He was told at that time it could be secondary to lithotripsy.   ED Course:  When he came to the ER, vital signs were within normal.  Labs revealed a creatinine of 1.22 and albumin of 2.8 with total protein of 6.4.  High-sensitivity troponin I was 10 and later the same.  CBC was unremarkable.  Respiratory panel is currently pending.  Imaging:  Right lower extremity venous Doppler showed extensive occlusive DVT extending from the common femoral vein to the popliteal vein.    Chest CTA showed no evidence for PE or aortic dissection.  Lungs were clear.  Showed gallstones.  Vascular surgery was consulted by Dr. Leslye Peer for possible endovascular intervention.  Current Facility-Administered Medications  Medication Dose Route Frequency Provider Last Rate Last Admin   0.9 %  sodium chloride infusion   Intravenous Continuous Mansy, Arvella Merles, MD   Stopped at 09/13/21 1050   acetaminophen (TYLENOL) tablet 650 mg  650 mg Oral Q6H PRN Mansy, Jan  A, MD       Or   acetaminophen (TYLENOL) suppository 650 mg  650 mg Rectal Q6H PRN Mansy, Jan A, MD       allopurinol (ZYLOPRIM) tablet 300 mg  300 mg Oral Daily Loletha Grayer, MD       aspirin EC tablet 81 mg  81 mg Oral Daily Mansy, Jan A, MD   81 mg at 09/13/21 8099   cholecalciferol (VITAMIN D3) tablet 1,000 Units  1,000 Units Oral BID Mansy, Jan A, MD   1,000 Units at 09/13/21 0921   heparin ADULT infusion 100 units/mL (25000 units/256mL)  1,200 Units/hr Intravenous Continuous Rauer, Samantha O, RPH       insulin aspart (novoLOG) injection 0-9 Units  0-9 Units Subcutaneous TID AC & HS Mansy, Jan A, MD       magnesium hydroxide (MILK OF MAGNESIA) suspension 30 mL  30 mL Oral Daily PRN Mansy, Jan A, MD       metoprolol tartrate (LOPRESSOR) tablet 12.5 mg  12.5 mg Oral BID Loletha Grayer, MD       ondansetron [facility]) tablet 4 mg  4 mg Oral Q6H PRN Mansy, Jan A, MD       Or   ondansetron [facility]) injection 4 mg  4 mg Intravenous Q6H PRN Mansy, Jan A, MD       pantoprazole (PROTONIX) EC tablet 40 mg  40 mg Oral Daily Mansy, Arvella Merles, MD   40  mg at 09/13/21 0921   traZODone (DESYREL) tablet 25 mg  25 mg Oral QHS PRN Mansy, Arvella Merles, MD       Current Outpatient Medications  Medication Sig Dispense Refill   allopurinol (ZYLOPRIM) 300 MG tablet Take 1 tablet by mouth daily.     aspirin EC 81 MG tablet Take 81 mg by mouth daily.     Cholecalciferol 1000 UNITS tablet Take 1,000 Units by mouth 2 (two) times daily.     esomeprazole (NEXIUM) 40 MG capsule Take 1 capsule by mouth daily.     Eszopiclone 3 MG TABS      furosemide (LASIX) 20 MG tablet Take 40 mg by mouth daily at 12 noon.     metFORMIN (GLUCOPHAGE) 500 MG tablet Take 1,000 mg by mouth 2 (two) times daily with a meal.     metoprolol tartrate (LOPRESSOR) 25 MG tablet Take 0.5 tablets (12.5 mg total) by mouth 2 (two) times daily. (Patient taking differently: Take 50 mg by mouth 2 (two) times daily.)     potassium chloride SA (KLOR-CON) 20  MEQ tablet Take 20 mEq by mouth in the morning.     PRALUENT 150 MG/ML SOAJ Inject into the skin.     heparin 25000 UT/250ML infusion Inject 1,300 Units/hr into the vein continuous. (Patient not taking: No sig reported)     insulin detemir (LEVEMIR) 100 UNIT/ML injection Inject 0.14 mLs (14 Units total) into the skin at bedtime. (Patient not taking: No sig reported) 10 mL 11   ketorolac (TORADOL) 10 MG tablet Take 1 tablet (10 mg total) by mouth every 6 (six) hours as needed for moderate pain or severe pain. (Patient not taking: No sig reported) 10 tablet 0   lisinopril (ZESTRIL) 20 MG tablet Take 10 mg by mouth daily. (Patient not taking: No sig reported)     morphine (MS CONTIN) 30 MG 12 hr tablet Take 30 mg by mouth 2 (two) times daily. (Patient not taking: No sig reported)     morphine (MSIR) 15 MG tablet Take 15 mg by mouth 3 (three) times daily as needed. (Patient not taking: No sig reported)     nitroGLYCERIN (NITROSTAT) 0.4 MG SL tablet Place 1 tablet (0.4 mg total) under the tongue every 5 (five) minutes x 3 doses as needed for chest pain. (Patient not taking: No sig reported)  12   Past Medical History:  Diagnosis Date   Arthritis    BPH (benign prostatic hyperplasia)    Diabetes mellitus, type 2 (HCC)    DVT (deep venous thrombosis) ([place]) 05/12/2017   left upper leg   Emphysema of lung ([place])    mild - on xray   Fatty liver    GERD (gastroesophageal reflux disease)    Hypertension    Kidney stone    Past Surgical History:  Procedure Laterality Date   CATARACT EXTRACTION W/PHACO Right 09/11/2018   Procedure: CATARACT EXTRACTION PHACO AND INTRAOCULAR LENS PLACEMENT ([place])  RIGHT DIABETIC;  Surgeon: Leandrew Koyanagi, MD;  Location: [place];  Service: Ophthalmology;  Laterality: Right;  Diabetic - oral meds Latex sensitiviy   CATARACT EXTRACTION W/PHACO Left 10/09/2018   Procedure: CATARACT EXTRACTION PHACO AND INTRAOCULAR LENS PLACEMENT ([place])  LEFT DIABETIC;   Surgeon: Leandrew Koyanagi, MD;  Location: [place];  Service: Ophthalmology;  Laterality: Left;  Diabetic - oral meds Latex sensitivity   COLONOSCOPY WITH PROPOFOL N/A 07/28/2019   Procedure: COLONOSCOPY WITH PROPOFOL;  Surgeon: Alice Reichert, Benay Pike, MD;  Location:  [facility];  Service: Gastroenterology;  Laterality: N/A;   EXTRACORPOREAL SHOCK WAVE LITHOTRIPSY Left 01/25/2017   Procedure: EXTRACORPOREAL SHOCK WAVE LITHOTRIPSY (ESWL);  Surgeon: Nickie Retort, MD;  Location: [facility] OR[facility];  Service: Urology;  Laterality: Left;   LASER OF PROSTATE W/ GREEN LIGHT PVP  2008   Dr. Quillian Quince   LEFT HEART CATH AND CORONARY ANGIOGRAPHY N/A 05/16/2021   Procedure: LEFT HEART CATH AND CORONARY ANGIOGRAPHY;  Surgeon: Corey Skains, MD;  Location: [place] [facility];  Service: Cardiovascular;  Laterality: N/A;   LUMBAR LAMINECTOMY     TONSILLECTOMY AND ADENOIDECTOMY     URETEROSCOPY WITH HOLMIUM LASER LITHOTRIPSY  2004   Social History Social History   Tobacco Use   Smoking status: Former    Packs/day: 1.00    Years: 15.00    Pack years: 15.00    Types: Cigarettes    Quit date: 1995    Years since quitting: 27.8   Smokeless tobacco: Never  Vaping Use   Vaping Use: Never used  Substance Use Topics   Alcohol use: Yes    Alcohol/week: 0.0 standard drinks    Comment: Holidays only   Drug use: No   Family History Family History  Problem Relation Age of Onset   Kidney disease Neg Hx    Kidney cancer Neg Hx    Prostate cancer Neg Hx   Denies family history of peripheral artery disease, venous disease or bleeding/clotting disorders.  Allergies  Allergen Reactions   Augmentin [Amoxicillin-Pot Clavulanate] Anaphylaxis   Latex Other (See Comments)    Nasal inflammation   Other Anaphylaxis   Solifenacin Other (See Comments)   Sulfa Antibiotics Anaphylaxis   Rosuvastatin     Other reaction(s): Muscle pain   Atorvastatin     Other reaction(s): Muscle Pain, Pain in lower  limb   Omeprazole     Other reaction(s): Heartburn   Pravastatin     Other reaction(s): Muscle pain   Simvastatin Other (See Comments)    Other reaction(s): Fatigue, Muscle pain   REVIEW OF SYSTEMS (Negative unless checked)  Constitutional: [] Weight loss  [] Fever  [] Chills Cardiac: [] Chest pain   [] Chest pressure   [] Palpitations   [] Shortness of breath when laying flat   [] Shortness of breath at rest   [] Shortness of breath with exertion. Vascular:  [] Pain in legs with walking   [] Pain in legs at rest   [] Pain in legs when laying flat   [] Claudication   [] Pain in feet when walking  [] Pain in feet at rest  [] Pain in feet when laying flat   [x] History of DVT   [] Phlebitis   [x] Swelling in legs   [] Varicose veins   [] Non-healing ulcers Pulmonary:   [] Uses home oxygen   [] Productive cough   [] Hemoptysis   [] Wheeze  [] COPD   [] Asthma Neurologic:  [] Dizziness  [] Blackouts   [] Seizures   [] History of stroke   [] History of TIA  [] Aphasia   [] Temporary blindness   [] Dysphagia   [] Weakness or numbness in arms   [] Weakness or numbness in legs Musculoskeletal:  [] Arthritis   [] Joint swelling   [] Joint pain   [] Low back pain Hematologic:  [] Easy bruising  [] Easy bleeding   [] Hypercoagulable state   [] Anemic  [] Hepatitis Gastrointestinal:  [] Blood in stool   [] Vomiting blood  [] Gastroesophageal reflux/heartburn   [] Difficulty swallowing. Genitourinary:  [] Chronic kidney disease   [] Difficult urination  [] Frequent urination  [] Burning with urination   [] Blood in urine Skin:  [] Rashes   [] Ulcers   []   Wounds Psychological:  [] History of anxiety   []  History of major depression.  Physical Examination  Vitals:   09/13/21 0500 09/13/21 0604 09/13/21 0900 09/13/21 1000  BP: 104/71 97/69 109/75 111/79  Pulse: 78 87 78 81  Resp: 16 17 13 14   Temp:      TempSrc:      SpO2: 92% 96% 94% 96%  Weight:      Height:       Body mass index is 25.6 kg/m. Gen:  WD/WN, NAD Head: [place]/AT, No temporalis wasting.  Prominent temp pulse not noted. Ear/Nose/Throat: Hearing grossly intact, nares w/o erythema or drainage, oropharynx w/o Erythema/Exudate Eyes: Sclera non-icteric, conjunctiva clear Neck: Trachea midline.  No JVD.  Pulmonary:  Good air movement, respirations not labored, equal bilaterally.  Cardiac: RRR, normal S1, S2. Vascular:  Vessel Right Left  Radial Palpable Palpable  Ulnar Palpable Palpable  Brachial Palpable Palpable  Carotid Palpable, without bruit Palpable, without bruit  Aorta Not palpable N/A  Femoral Palpable Palpable  Popliteal Palpable Palpable  PT Palpable Palpable  DP Palpable Palpable   Right Lower Extremity: Thigh soft. Calf soft. Extremity is mildly edematous. Warm distally to toes.  There is no acute vascular compromise noted to the extremity at this time.  Motor/sensory is intact.  Gastrointestinal: soft, non-tender/non-distended. No guarding/reflex.  Musculoskeletal: M/S 5/5 throughout.  Extremities without ischemic changes.  No deformity or atrophy. No edema. Neurologic: Sensation grossly intact in extremities.  Symmetrical.  Speech is fluent. Motor exam as listed above. Psychiatric: Judgment intact, Mood & affect appropriate for pt's clinical situation. Dermatologic: No rashes or ulcers noted.  No cellulitis or open wounds. Lymph : No Cervical, Axillary, or Inguinal lymphadenopathy.  CBC Lab Results  Component Value Date   WBC 8.1 09/13/2021   HGB 12.9 (L) 09/13/2021   HCT 38.8 (L) 09/13/2021   MCV 90.2 09/13/2021   PLT 196 09/13/2021   BMET    Component Value Date/Time   NA 139 09/13/2021 0618   K 3.2 (L) 09/13/2021 0618   CL 105 09/13/2021 0618   CO2 25 09/13/2021 0618   GLUCOSE 131 (H) 09/13/2021 0618   BUN 19 09/13/2021 0618   CREATININE 1.06 09/13/2021 0618   CALCIUM 8.3 (L) 09/13/2021 0618   GFRNONAA >60 09/13/2021 0618   GFRAA 56 (L) 11/12/2017 1816   Estimated Creatinine Clearance: 66 mL/min (by C-G formula based on SCr of 1.06  mg/dL).  COAG Lab Results  Component Value Date   INR 1.2 09/13/2021   INR 1.0 05/14/2021   INR 0.97 05/13/2017   Radiology CT Angio Chest PE W and/or Wo Contrast  Result Date: 09/12/2021 CLINICAL DATA:  Lower extremity swelling EXAM: CT ANGIOGRAPHY CHEST WITH CONTRAST TECHNIQUE: Multidetector CT imaging of the chest was performed using the standard protocol during bolus administration of intravenous contrast. Multiplanar CT image reconstructions and MIPs were obtained to evaluate the vascular anatomy. CONTRAST:  92mL OMNIPAQUE IOHEXOL 350 MG/ML SOLN COMPARISON:  Chest CT 05/14/2021 FINDINGS: Cardiovascular: Satisfactory opacification of the pulmonary arteries to the segmental level. No evidence of pulmonary embolism. Normal heart size. No pericardial effusion. Post CABG changes. Coronary vascular calcification Mediastinum/Nodes: Midline trachea. No thyroid mass. No suspicious adenopathy. Esophagus within normal limits. Fat containing hiatal hernia. Lungs/Pleura: Lungs are clear. No pleural effusion or pneumothorax. Upper Abdomen: No acute abnormality.  Gallstones. Musculoskeletal: No chest wall abnormality. No acute or significant osseous findings. Review of the MIP images confirms the above findings. IMPRESSION: 1. Negative for acute pulmonary embolus.  Negative for aortic dissection. 2. Clear lung fields 3. Gallstones Aortic Atherosclerosis (ICD10-I70.0). Electronically Signed   By: Donavan Foil M.D.   On: 09/12/2021 22:51   US Venous Img Lower Unilateral Right  Result Date: 09/12/2021 CLINICAL DATA:  Right leg swelling EXAM: RIGHT LOWER EXTREMITY VENOUS DOPPLER ULTRASOUND TECHNIQUE: Gray-scale sonography with graded compression, as well as color Doppler and duplex ultrasound were performed to evaluate the lower extremity deep venous systems from the level of the common femoral vein and including the common femoral, femoral, profunda femoral, popliteal and calf veins including the posterior  tibial, peroneal and gastrocnemius veins when visible. The superficial great saphenous vein was also interrogated. Spectral Doppler was utilized to evaluate flow at rest and with distal augmentation maneuvers in the common femoral, femoral and popliteal veins. COMPARISON:  None. FINDINGS: Contralateral Common Femoral Vein: No evidence of thrombus. Normal compressibility and flow on color Doppler imaging. Common Femoral Vein: Occlusive thrombus. Vessel is noncompressible. Saphenofemoral Junction: Occlusive thrombus. Vessel is noncompressible. Profunda Femoral Vein: Occlusive thrombus. Vessel is noncompressible. Femoral Vein: Occlusive thrombus. Vessel is noncompressible. Popliteal Vein: Occlusive thrombus. Vessel is noncompressible. Calf Veins: No evidence of thrombus. Normal compressibility and flow on color Doppler imaging. Superficial Great Saphenous Vein: No evidence of thrombus. Normal compressibility. Venous Reflux:  None. Other Findings:  None. IMPRESSION: Extensive occlusive deep venous thrombosis within the right lower extremity extending from the common femoral vein to the popliteal vein. These results were called by telephone at the time of interpretation on 09/12/2021 at 8:30 pm to provider Dr Jari Pigg, who verbally acknowledged these results. Electronically Signed   By: Davina Poke D.O.   On: 09/12/2021 20:30    Assessment/Plan YITZCHAK KOTHARI is a 75 year old caucasian male with medical history significant for type 2 diabetes mellitus, BPH, GERD, hypertension, nephrolithiasis and emphysema, who presented to the emergency room with acute onset of right leg swelling and pain over the last couple of days with current difficulty with ambulation using a walker found to have extensive right lower extremity DVT thrombosis  1.  Right Lower Extremity DVT: Patient with history of DVT to the left lower extremity in 2018.  This was primarily Ecologist was treated with oral anticoagulation  approximately 8 months.  Patient with recent surgery in July otherwise denies any recent surgery/trauma prolonged immobility, recent COVID infection or bleeding/clotting disorders.  Patient has not seen hematologist yet.  Due to the extensive nature of his DVT recommend he undergo a peripheral thrombolysis and thrombectomy and attempt to lessen the clot burden, improve his symptoms, reduce the likelihood of pulmonary embolism as well as decreased postphlebitic symptoms.  Procedure, risks and benefits were explained to the patient and his wife was at the bedside.  All questions were answered.  Patient wants to proceed.  We will plan on today with Dr. Delana Meyer.  2.  Hyperlipidemia: On aspirin for medical management. Documented allergy on statin Encouraged good control as its slows the progression of atherosclerotic disease   3.  Type 2 diabetes: On appropriate medications. Encouraged good control as its slows the progression of atherosclerotic disease   Discussed with Dr. Francene Castle, PA-C 09/13/2021 11:07 AM  This note was created with Dragon medical transcription system.  Any error is purely unintentional.

## 2021-09-13 NOTE — Op Note (Signed)
VEIN AND VASCULAR SURGERY                                                                               OPERATIVE NOTE    PRE-OPERATIVE DIAGNOSIS: Symptomatic right leg DVT  POST-OPERATIVE DIAGNOSIS: Same  PROCEDURE: 1.   Ultrasound guidance for vascular access to the right popliteal vein 2.   Introduction catheter into venous system second order catheter placement right popliteal approach 3.   Inferior venacavogram 4.   Infusion thrombolysis with 10 mg of TPA 5.   Mechanical thrombectomy of the right popliteal, SFV, common femoral vein and the right external and common iliac veins using the Penumbr CAT 12 lightning catheter. 6.   Percutaneous transluminal angioplasty to 12 mm right common femoral vein.       SURGEON: Hortencia Pilar  ASSISTANT(S): None  ANESTHESIA: Conscious sedation was administered by the interventional radiology RN under my direct supervision. IV Versed plus fentanyl were utilized. Continuous ECG, pulse oximetry and blood pressure was monitored throughout the entire procedure.  Conscious sedation was administered for a total of 1 hour 19 minutes.  ESTIMATED BLOOD LOSS: minimal  Contrast: 40  Fluoroscopy time: 8.2  FINDING(S): 1.  Patent IVC; thrombus within the right popliteal, superficial femoral vein and common femoral vein as well as the right external iliac vein and the right common iliac vein  SPECIMEN(S):  none  INDICATIONS:    Ralph Marsh is a 75 y.o. year old male who presents with massive swelling of the left leg quite painful in association with pleuritic chest pains and hypoxia as well as shortness of breath.  Inferior vena cava filter is indicated for this reason.  Risks and benefits including filter thrombosis, migration, fracture, bleeding, and infection were all discussed.  We discussed that all IVC filters that we place can be removed if desired from the patient  once the need for the filter has passed.    DESCRIPTION: After obtaining full informed written consent, the patient was brought back to the vascular suite.   The patient was then repositioned to the prone and the popliteal fossa of the right leg was prepped and draped in a sterile fashion. Ultrasound was placed in a sterile sleeve. Ultrasound is utilized to gain access to the popliteal vein. Vein is known to have thrombus within it by previous ultrasound. It is therefore identified by its enlarged size with heterogenous thrombus and non-compressibility. 1% lidocaine is infiltrated in soft tissues and subsequent microneedle is inserted into the popliteal vein without difficulty. Images recorded for the permanent record and the puncture is made under real-time visualization. Microwire is then advanced under fluoroscopic guidance and noted to follow the path of the superficial femoral vein. Therefore the micro-sheath was placed followed by a Magic torque wire and subsequently an 12 [place] Pinnacle sheath.  KMP catheter together with the Magic torque wire then advanced up to the level of the common iliac and hand injection contrast is utilized to demonstrate that the common iliac vein on the right also is filled with thrombus.  The inferior vena cava is patent. Catheter is then repositioned to the common femoral level and hand injection contrast demonstrates that there is a and occlusive thrombus within the common femoral however the external iliac vein on the left appears to be patent.   The patient is given 3000 units of heparin.  Kumpe catheter is then used to deliver 10 mg of TPA. This is then laced throughout the common femoral superficial femoral and popliteal veins. The TPA is then allowed to dwell.   Subsequently, the penumbra CAT 12 lightening catheter is engaged in the aspiration mode and aspiration of the common iliac, external iliac, common femoral as well as the popliteal and superficial  femoral veins is performed multiple passes are made with the volumes recorded in the procedure notes.  Passes are made both initially in the retrograde direction with and without the separator and then in the antegrade direction again both with and without the separator.  Follow-up imaging now demonstrates that almost all the thrombus is been eliminated from the proximal popliteal as well as the superficial femoral vein.  However there remains significant thrombus within the common femoral.  The thrombus within the external and common iliac veins has also been removed.    There does appear to be a stricture or stenosis associated with thrombus in the common femoral vein. Therefore a 8 x 100 mm balloon was inflated to 6-8 atm for 1 minute and subsequently a 12 x 6 balloon is inflated to 10 atm for 1 minute. Follow-up imaging demonstrates there is some residual thrombus but a marked improvement and therefore the penumbra lightening catheter is advanced to this level and multiple short passes are made through this region. After this maneuver follow-up imaging demonstrates minimal residual thrombus within the common femoral vein. Iliac veins are free of thrombus as is the superficial femoral vein and proximal popliteal vein.  The wire is removed the sheath is removed pressures held and a pressure dressing with Coban and is then applied. The patient is then returned to the supine position  Interpretation: Initial images demonstrated normal vena cava filter is placed without difficulty. Initial images the left lower extremity then demonstrated extensive thrombus throughout the popliteal and femoral veins as well as the iliac veins.  Following aspiration with the lightening catheter in association with tPA there is now essentially 0 residual thrombus within the iliac veins as well as the proximal popliteal and superficial femoral vein.  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  After angioplasty first 8 mm in the 12 mm there is now significant improvement.  Further passes with the lightening catheter to extract thrombus yield a common femoral vein with less than 20% residual stenosis.  COMPLICATIONS: None  CONDITION: Stable  Hortencia Pilar  09/13/2021,4:42 PM

## 2021-09-13 NOTE — Consult Note (Signed)
[facility] Vascular Consult Note  MRN : [id number]  Ralph Marsh is a 75 y.o. (1946/02/13) male who presents with chief complaint of  Chief Complaint  Patient presents with   Leg Pain   History of Present Illness: Ralph Marsh is a 75 year old caucasian male with medical history significant for type 2 diabetes mellitus, BPH, GERD, hypertension, nephrolithiasis and emphysema, who presented to the emergency room with acute onset of right leg swelling and pain over the last couple of days with current difficulty with ambulation using a walker.    He has been feeling generally weak.  He denies any chest pain.  He has been having cough and dyspnea worsening with exertion.  No fever or chills.  He denies any bleeding diathesis.  He denies any recent travels or surgeries.  He has been fairly inactive recently though due to recent bronchitis and feeling sick.  He had a DVT in 2018 in the left leg and took Eliquis for 8 months and stopped.  He was told at that time it could be secondary to lithotripsy.   ED Course:  When he came to the ER, vital signs were within normal.  Labs revealed a creatinine of 1.22 and albumin of 2.8 with total protein of 6.4.  High-sensitivity troponin I was 10 and later the same.  CBC was unremarkable.  Respiratory panel is currently pending.  Imaging:  Right lower extremity venous Doppler showed extensive occlusive DVT extending from the common femoral vein to the popliteal vein.    Chest CTA showed no evidence for PE or aortic dissection.  Lungs were clear.  Showed gallstones.  Vascular surgery was consulted by Dr. Leslye Peer for possible endovascular intervention.  Current Facility-Administered Medications  Medication Dose Route Frequency Provider Last Rate Last Admin   0.9 %  sodium chloride infusion   Intravenous Continuous Mansy, Arvella Merles, MD   Stopped at 09/13/21 1050   acetaminophen (TYLENOL) tablet 650 mg  650 mg Oral Q6H PRN Mansy, Jan  A, MD       Or   acetaminophen (TYLENOL) suppository 650 mg  650 mg Rectal Q6H PRN Mansy, Jan A, MD       allopurinol (ZYLOPRIM) tablet 300 mg  300 mg Oral Daily Loletha Grayer, MD       aspirin EC tablet 81 mg  81 mg Oral Daily Mansy, Jan A, MD   81 mg at 09/13/21 8453   cholecalciferol (VITAMIN D3) tablet 1,000 Units  1,000 Units Oral BID Mansy, Jan A, MD   1,000 Units at 09/13/21 0921   heparin ADULT infusion 100 units/mL (25000 units/271mL)  1,200 Units/hr Intravenous Continuous Rauer, Samantha O, RPH       insulin aspart (novoLOG) injection 0-9 Units  0-9 Units Subcutaneous TID AC & HS Mansy, Jan A, MD       magnesium hydroxide (MILK OF MAGNESIA) suspension 30 mL  30 mL Oral Daily PRN Mansy, Jan A, MD       metoprolol tartrate (LOPRESSOR) tablet 12.5 mg  12.5 mg Oral BID Loletha Grayer, MD       ondansetron [facility]) tablet 4 mg  4 mg Oral Q6H PRN Mansy, Jan A, MD       Or   ondansetron [facility]) injection 4 mg  4 mg Intravenous Q6H PRN Mansy, Jan A, MD       pantoprazole (PROTONIX) EC tablet 40 mg  40 mg Oral Daily Mansy, Arvella Merles, MD   40  mg at 09/13/21 0921   traZODone (DESYREL) tablet 25 mg  25 mg Oral QHS PRN Mansy, Arvella Merles, MD       Current Outpatient Medications  Medication Sig Dispense Refill   allopurinol (ZYLOPRIM) 300 MG tablet Take 1 tablet by mouth daily.     aspirin EC 81 MG tablet Take 81 mg by mouth daily.     Cholecalciferol 1000 UNITS tablet Take 1,000 Units by mouth 2 (two) times daily.     esomeprazole (NEXIUM) 40 MG capsule Take 1 capsule by mouth daily.     Eszopiclone 3 MG TABS      furosemide (LASIX) 20 MG tablet Take 40 mg by mouth daily at 12 noon.     metFORMIN (GLUCOPHAGE) 500 MG tablet Take 1,000 mg by mouth 2 (two) times daily with a meal.     metoprolol tartrate (LOPRESSOR) 25 MG tablet Take 0.5 tablets (12.5 mg total) by mouth 2 (two) times daily. (Patient taking differently: Take 50 mg by mouth 2 (two) times daily.)     potassium chloride SA (KLOR-CON) 20  MEQ tablet Take 20 mEq by mouth in the morning.     PRALUENT 150 MG/ML SOAJ Inject into the skin.     heparin 25000 UT/250ML infusion Inject 1,300 Units/hr into the vein continuous. (Patient not taking: No sig reported)     insulin detemir (LEVEMIR) 100 UNIT/ML injection Inject 0.14 mLs (14 Units total) into the skin at bedtime. (Patient not taking: No sig reported) 10 mL 11   ketorolac (TORADOL) 10 MG tablet Take 1 tablet (10 mg total) by mouth every 6 (six) hours as needed for moderate pain or severe pain. (Patient not taking: No sig reported) 10 tablet 0   lisinopril (ZESTRIL) 20 MG tablet Take 10 mg by mouth daily. (Patient not taking: No sig reported)     morphine (MS CONTIN) 30 MG 12 hr tablet Take 30 mg by mouth 2 (two) times daily. (Patient not taking: No sig reported)     morphine (MSIR) 15 MG tablet Take 15 mg by mouth 3 (three) times daily as needed. (Patient not taking: No sig reported)     nitroGLYCERIN (NITROSTAT) 0.4 MG SL tablet Place 1 tablet (0.4 mg total) under the tongue every 5 (five) minutes x 3 doses as needed for chest pain. (Patient not taking: No sig reported)  12   Past Medical History:  Diagnosis Date   Arthritis    BPH (benign prostatic hyperplasia)    Diabetes mellitus, type 2 (HCC)    DVT (deep venous thrombosis) ([place]) 05/12/2017   left upper leg   Emphysema of lung ([place])    mild - on xray   Fatty liver    GERD (gastroesophageal reflux disease)    Hypertension    Kidney stone    Past Surgical History:  Procedure Laterality Date   CATARACT EXTRACTION W/PHACO Right 09/11/2018   Procedure: CATARACT EXTRACTION PHACO AND INTRAOCULAR LENS PLACEMENT ([place])  RIGHT DIABETIC;  Surgeon: Leandrew Koyanagi, MD;  Location: [place];  Service: Ophthalmology;  Laterality: Right;  Diabetic - oral meds Latex sensitiviy   CATARACT EXTRACTION W/PHACO Left 10/09/2018   Procedure: CATARACT EXTRACTION PHACO AND INTRAOCULAR LENS PLACEMENT ([place])  LEFT DIABETIC;   Surgeon: Leandrew Koyanagi, MD;  Location: [place];  Service: Ophthalmology;  Laterality: Left;  Diabetic - oral meds Latex sensitivity   COLONOSCOPY WITH PROPOFOL N/A 07/28/2019   Procedure: COLONOSCOPY WITH PROPOFOL;  Surgeon: Alice Reichert, Benay Pike, MD;  Location:  [facility];  Service: Gastroenterology;  Laterality: N/A;   EXTRACORPOREAL SHOCK WAVE LITHOTRIPSY Left 01/25/2017   Procedure: EXTRACORPOREAL SHOCK WAVE LITHOTRIPSY (ESWL);  Surgeon: Nickie Retort, MD;  Location: [facility];  Service: Urology;  Laterality: Left;   LASER OF PROSTATE W/ GREEN LIGHT PVP  2008   Dr. Quillian Quince   LEFT HEART CATH AND CORONARY ANGIOGRAPHY N/A 05/16/2021   Procedure: LEFT HEART CATH AND CORONARY ANGIOGRAPHY;  Surgeon: Corey Skains, MD;  Location: [place] [facility];  Service: Cardiovascular;  Laterality: N/A;   LUMBAR LAMINECTOMY     TONSILLECTOMY AND ADENOIDECTOMY     URETEROSCOPY WITH HOLMIUM LASER LITHOTRIPSY  2004   Social History Social History   Tobacco Use   Smoking status: Former    Packs/day: 1.00    Years: 15.00    Pack years: 15.00    Types: Cigarettes    Quit date: 1995    Years since quitting: 27.8   Smokeless tobacco: Never  Vaping Use   Vaping Use: Never used  Substance Use Topics   Alcohol use: Yes    Alcohol/week: 0.0 standard drinks    Comment: Holidays only   Drug use: No   Family History Family History  Problem Relation Age of Onset   Kidney disease Neg Hx    Kidney cancer Neg Hx    Prostate cancer Neg Hx   Denies family history of peripheral artery disease, venous disease or bleeding/clotting disorders.  Allergies  Allergen Reactions   Augmentin [Amoxicillin-Pot Clavulanate] Anaphylaxis   Latex Other (See Comments)    Nasal inflammation   Other Anaphylaxis   Solifenacin Other (See Comments)   Sulfa Antibiotics Anaphylaxis   Rosuvastatin     Other reaction(s): Muscle pain   Atorvastatin     Other reaction(s): Muscle Pain, Pain in lower  limb   Omeprazole     Other reaction(s): Heartburn   Pravastatin     Other reaction(s): Muscle pain   Simvastatin Other (See Comments)    Other reaction(s): Fatigue, Muscle pain   REVIEW OF SYSTEMS (Negative unless checked)  Constitutional: [] Weight loss  [] Fever  [] Chills Cardiac: [] Chest pain   [] Chest pressure   [] Palpitations   [] Shortness of breath when laying flat   [] Shortness of breath at rest   [] Shortness of breath with exertion. Vascular:  [] Pain in legs with walking   [] Pain in legs at rest   [] Pain in legs when laying flat   [] Claudication   [] Pain in feet when walking  [] Pain in feet at rest  [] Pain in feet when laying flat   [x] History of DVT   [] Phlebitis   [x] Swelling in legs   [] Varicose veins   [] Non-healing ulcers Pulmonary:   [] Uses home oxygen   [] Productive cough   [] Hemoptysis   [] Wheeze  [] COPD   [] Asthma Neurologic:  [] Dizziness  [] Blackouts   [] Seizures   [] History of stroke   [] History of TIA  [] Aphasia   [] Temporary blindness   [] Dysphagia   [] Weakness or numbness in arms   [] Weakness or numbness in legs Musculoskeletal:  [] Arthritis   [] Joint swelling   [] Joint pain   [] Low back pain Hematologic:  [] Easy bruising  [] Easy bleeding   [] Hypercoagulable state   [] Anemic  [] Hepatitis Gastrointestinal:  [] Blood in stool   [] Vomiting blood  [] Gastroesophageal reflux/heartburn   [] Difficulty swallowing. Genitourinary:  [] Chronic kidney disease   [] Difficult urination  [] Frequent urination  [] Burning with urination   [] Blood in urine Skin:  [] Rashes   [] Ulcers   []   Wounds Psychological:  [] History of anxiety   []  History of major depression.  Physical Examination  Vitals:   09/13/21 0500 09/13/21 0604 09/13/21 0900 09/13/21 1000  BP: 104/71 97/69 109/75 111/79  Pulse: 78 87 78 81  Resp: 16 17 13 14   Temp:      TempSrc:      SpO2: 92% 96% 94% 96%  Weight:      Height:       Body mass index is 25.6 kg/m. Gen:  WD/WN, NAD Head: [place]/AT, No temporalis wasting.  Prominent temp pulse not noted. Ear/Nose/Throat: Hearing grossly intact, nares w/o erythema or drainage, oropharynx w/o Erythema/Exudate Eyes: Sclera non-icteric, conjunctiva clear Neck: Trachea midline.  No JVD.  Pulmonary:  Good air movement, respirations not labored, equal bilaterally.  Cardiac: RRR, normal S1, S2. Vascular:  Vessel Right Left  Radial Palpable Palpable  Ulnar Palpable Palpable  Brachial Palpable Palpable  Carotid Palpable, without bruit Palpable, without bruit  Aorta Not palpable N/A  Femoral Palpable Palpable  Popliteal Palpable Palpable  PT Palpable Palpable  DP Palpable Palpable   Right Lower Extremity: Thigh soft. Calf soft. Extremity is mildly edematous. Warm distally to toes.  There is no acute vascular compromise noted to the extremity at this time.  Motor/sensory is intact.  Gastrointestinal: soft, non-tender/non-distended. No guarding/reflex.  Musculoskeletal: M/S 5/5 throughout.  Extremities without ischemic changes.  No deformity or atrophy. No edema. Neurologic: Sensation grossly intact in extremities.  Symmetrical.  Speech is fluent. Motor exam as listed above. Psychiatric: Judgment intact, Mood & affect appropriate for pt's clinical situation. Dermatologic: No rashes or ulcers noted.  No cellulitis or open wounds. Lymph : No Cervical, Axillary, or Inguinal lymphadenopathy.  CBC Lab Results  Component Value Date   WBC 8.1 09/13/2021   HGB 12.9 (L) 09/13/2021   HCT 38.8 (L) 09/13/2021   MCV 90.2 09/13/2021   PLT 196 09/13/2021   BMET    Component Value Date/Time   NA 139 09/13/2021 0618   K 3.2 (L) 09/13/2021 0618   CL 105 09/13/2021 0618   CO2 25 09/13/2021 0618   GLUCOSE 131 (H) 09/13/2021 0618   BUN 19 09/13/2021 0618   CREATININE 1.06 09/13/2021 0618   CALCIUM 8.3 (L) 09/13/2021 0618   GFRNONAA >60 09/13/2021 0618   GFRAA 56 (L) 11/12/2017 1816   Estimated Creatinine Clearance: 66 mL/min (by C-G formula based on SCr of 1.06  mg/dL).  COAG Lab Results  Component Value Date   INR 1.2 09/13/2021   INR 1.0 05/14/2021   INR 0.97 05/13/2017   Radiology CT Angio Chest PE W and/or Wo Contrast  Result Date: 09/12/2021 CLINICAL DATA:  Lower extremity swelling EXAM: CT ANGIOGRAPHY CHEST WITH CONTRAST TECHNIQUE: Multidetector CT imaging of the chest was performed using the standard protocol during bolus administration of intravenous contrast. Multiplanar CT image reconstructions and MIPs were obtained to evaluate the vascular anatomy. CONTRAST:  38mL OMNIPAQUE IOHEXOL 350 MG/ML SOLN COMPARISON:  Chest CT 05/14/2021 FINDINGS: Cardiovascular: Satisfactory opacification of the pulmonary arteries to the segmental level. No evidence of pulmonary embolism. Normal heart size. No pericardial effusion. Post CABG changes. Coronary vascular calcification Mediastinum/Nodes: Midline trachea. No thyroid mass. No suspicious adenopathy. Esophagus within normal limits. Fat containing hiatal hernia. Lungs/Pleura: Lungs are clear. No pleural effusion or pneumothorax. Upper Abdomen: No acute abnormality.  Gallstones. Musculoskeletal: No chest wall abnormality. No acute or significant osseous findings. Review of the MIP images confirms the above findings. IMPRESSION: 1. Negative for acute pulmonary embolus.  Negative for aortic dissection. 2. Clear lung fields 3. Gallstones Aortic Atherosclerosis (ICD10-I70.0). Electronically Signed   By: Donavan Foil M.D.   On: 09/12/2021 22:51   US Venous Img Lower Unilateral Right  Result Date: 09/12/2021 CLINICAL DATA:  Right leg swelling EXAM: RIGHT LOWER EXTREMITY VENOUS DOPPLER ULTRASOUND TECHNIQUE: Gray-scale sonography with graded compression, as well as color Doppler and duplex ultrasound were performed to evaluate the lower extremity deep venous systems from the level of the common femoral vein and including the common femoral, femoral, profunda femoral, popliteal and calf veins including the posterior  tibial, peroneal and gastrocnemius veins when visible. The superficial great saphenous vein was also interrogated. Spectral Doppler was utilized to evaluate flow at rest and with distal augmentation maneuvers in the common femoral, femoral and popliteal veins. COMPARISON:  None. FINDINGS: Contralateral Common Femoral Vein: No evidence of thrombus. Normal compressibility and flow on color Doppler imaging. Common Femoral Vein: Occlusive thrombus. Vessel is noncompressible. Saphenofemoral Junction: Occlusive thrombus. Vessel is noncompressible. Profunda Femoral Vein: Occlusive thrombus. Vessel is noncompressible. Femoral Vein: Occlusive thrombus. Vessel is noncompressible. Popliteal Vein: Occlusive thrombus. Vessel is noncompressible. Calf Veins: No evidence of thrombus. Normal compressibility and flow on color Doppler imaging. Superficial Great Saphenous Vein: No evidence of thrombus. Normal compressibility. Venous Reflux:  None. Other Findings:  None. IMPRESSION: Extensive occlusive deep venous thrombosis within the right lower extremity extending from the common femoral vein to the popliteal vein. These results were called by telephone at the time of interpretation on 09/12/2021 at 8:30 pm to provider Dr Jari Pigg, who verbally acknowledged these results. Electronically Signed   By: Davina Poke D.O.   On: 09/12/2021 20:30    Assessment/Plan BEREL NAJJAR is a 75 year old caucasian male with medical history significant for type 2 diabetes mellitus, BPH, GERD, hypertension, nephrolithiasis and emphysema, who presented to the emergency room with acute onset of right leg swelling and pain over the last couple of days with current difficulty with ambulation using a walker found to have extensive right lower extremity DVT thrombosis  1.  Right Lower Extremity DVT: Patient with history of DVT to the left lower extremity in 2018.  This was primarily Ec[employer] was treated with oral anticoagulation  approximately 8 months.  Patient with recent surgery in July otherwise denies any recent surgery/trauma prolonged immobility, recent COVID infection or bleeding/clotting disorders.  Patient has not seen hematologist yet.  Due to the extensive nature of his DVT recommend he undergo a peripheral thrombolysis and thrombectomy and attempt to lessen the clot burden, improve his symptoms, reduce the likelihood of pulmonary embolism as well as decreased postphlebitic symptoms.  Procedure, risks and benefits were explained to the patient and his wife was at the bedside.  All questions were answered.  Patient wants to proceed.  We will plan on today with Dr. Delana Meyer.  2.  Hyperlipidemia: On aspirin for medical management. Documented allergy on statin Encouraged good control as its slows the progression of atherosclerotic disease   3.  Type 2 diabetes: On appropriate medications. Encouraged good control as its slows the progression of atherosclerotic disease   Discussed with Dr. Francene Castle, PA-C 09/13/2021 11:07 AM  This note was created with Dragon medical transcription system.  Any error is purely unintentional.

## 2021-09-14 ENCOUNTER — Encounter: Payer: Self-pay | Admitting: Vascular Surgery

## 2021-09-14 DIAGNOSIS — M546 Pain in thoracic spine: Secondary | ICD-10-CM

## 2021-09-14 DIAGNOSIS — I10 Essential (primary) hypertension: Secondary | ICD-10-CM | POA: Diagnosis not present

## 2021-09-14 DIAGNOSIS — I824Y1 Acute embolism and thrombosis of unspecified deep veins of right proximal lower extremity: Secondary | ICD-10-CM | POA: Diagnosis not present

## 2021-09-14 DIAGNOSIS — E1169 Type 2 diabetes mellitus with other specified complication: Secondary | ICD-10-CM | POA: Diagnosis not present

## 2021-09-14 LAB — CBC
HCT: 34.6 % — ABNORMAL LOW (ref 39.0–52.0)
Hemoglobin: 12 g/dL — ABNORMAL LOW (ref 13.0–17.0)
MCH: 31.4 pg (ref 26.0–34.0)
MCHC: 34.7 g/dL (ref 30.0–36.0)
MCV: 90.6 fL (ref 80.0–100.0)
Platelets: 180 10*3/uL (ref 150–400)
RBC: 3.82 MIL/uL — ABNORMAL LOW (ref 4.22–5.81)
RDW: 12.9 % (ref 11.5–15.5)
WBC: 6.7 10*3/uL (ref 4.0–10.5)
nRBC: 0 % (ref 0.0–0.2)

## 2021-09-14 LAB — BASIC METABOLIC PANEL
Anion gap: 7 (ref 5–15)
BUN: 14 mg/dL (ref 8–23)
CO2: 25 mmol/L (ref 22–32)
Calcium: 7.9 mg/dL — ABNORMAL LOW (ref 8.9–10.3)
Chloride: 106 mmol/L (ref 98–111)
Creatinine, Ser: 0.91 mg/dL (ref 0.61–1.24)
GFR, Estimated: >60 mL/min (ref >60)
Glucose, Bld: 212 mg/dL — ABNORMAL HIGH (ref 70–99)
Potassium: 3.3 mmol/L — ABNORMAL LOW (ref 3.5–5.1)
Sodium: 138 mmol/L (ref 135–145)

## 2021-09-14 LAB — GLUCOSE, CAPILLARY
Glucose-Capillary: 135 mg/dL — ABNORMAL HIGH (ref 70–99)
Glucose-Capillary: 192 mg/dL — ABNORMAL HIGH (ref 70–99)

## 2021-09-14 LAB — HEPARIN LEVEL (UNFRACTIONATED): Heparin Unfractionated: 0.66 IU/mL (ref 0.30–0.70)

## 2021-09-14 MED ORDER — APIXABAN 5 MG PO TABS
10.0000 mg | ORAL_TABLET | Freq: Two times a day (BID) | ORAL | Status: DC
  Administered 2021-09-14: 10 mg via ORAL
  Filled 2021-09-14: qty 2

## 2021-09-14 MED ORDER — APIXABAN 5 MG PO TABS: 5.0000 mg | ORAL_TABLET | Freq: Two times a day (BID) | ORAL | 0 refills | Status: DC

## 2021-09-14 MED ORDER — APIXABAN 5 MG PO TABS
ORAL_TABLET | ORAL | 0 refills | Status: DC
Start: 2021-09-14 — End: 2021-10-10

## 2021-09-14 MED ORDER — APIXABAN 5 MG PO TABS: 5.0000 mg | ORAL_TABLET | Freq: Two times a day (BID) | ORAL | Status: DC

## 2021-09-14 NOTE — Discharge Instructions (Addendum)
Vascular Surgery Discharge Instructions:  1) You may shower.  Please keep your access site clean and dry. 2) Please do not engage in strenuous activity or lifting greater than 10 pounds for at least 2 weeks.    Hold metformin for two more days then restart

## 2021-09-14 NOTE — Progress Notes (Signed)
ANTICOAGULATION CONSULT NOTE - Initial Consult  Pharmacy Consult for Heparin  Indication: DVT  Allergies  Allergen Reactions   Augmentin [Amoxicillin-Pot Clavulanate] Anaphylaxis   Latex Other (See Comments)    Nasal inflammation   Other Anaphylaxis   Solifenacin Other (See Comments)   Sulfa Antibiotics Anaphylaxis   Rosuvastatin     Other reaction(s): Muscle pain   Atorvastatin     Other reaction(s): Muscle Pain, Pain in lower limb   Omeprazole     Other reaction(s): Heartburn   Pravastatin     Other reaction(s): Muscle pain   Simvastatin Other (See Comments)    Other reaction(s): Fatigue, Muscle pain    Patient Measurements: Height: 6\' 1"  (185.4 cm) Weight: 88 kg (194 lb) IBW/kg (Calculated) : 79.9 Heparin Dosing Weight: 88 kg   Vital Signs: Temp: 98 F (36.7 C) (11/08 1945) Temp Source: Oral (11/08 1945) BP: 100/65 (11/08 1945) Pulse Rate: 99 (11/08 1945)  Labs: Recent Labs    09/12/21 1857 09/12/21 2039 09/12/21 2050 09/13/21 0022 09/13/21 0618 09/13/21 0937 09/14/21 0150  HGB 14.7  --   --   --  12.9*  --  12.0*  HCT 42.2  --   --   --  38.8*  --  34.6*  PLT 206  --   --   --  196  --  180  APTT  --   --   --  >200*  --   --   --   LABPROT  --   --   --  15.3*  --   --   --   INR  --   --   --  1.2  --   --   --   HEPARINUNFRC  --   --   --  0.77*  --  >1.10* 0.66  CREATININE  --  1.22  --   --  1.06  --  0.91  TROPONINIHS 10  --  10  --   --   --   --      Estimated Creatinine Clearance: 76.8 mL/min (by C-G formula based on SCr of 0.91 mg/dL).   Medical History: Past Medical History:  Diagnosis Date   Arthritis    BPH (benign prostatic hyperplasia)    Diabetes mellitus, type 2 (HCC)    DVT (deep venous thrombosis) ([place]) 05/12/2017   left upper leg   Emphysema of lung (HCC)    mild - on xray   Fatty liver    GERD (gastroesophageal reflux disease)    Hypertension    Kidney stone     Medications:  Medications Prior to Admission   Medication Sig Dispense Refill Last Dose   allopurinol (ZYLOPRIM) 300 MG tablet Take 1 tablet by mouth daily.   09/12/2021   aspirin EC 81 MG tablet Take 81 mg by mouth daily.   09/12/2021   Cholecalciferol 1000 UNITS tablet Take 1,000 Units by mouth 2 (two) times daily.   09/12/2021   esomeprazole (NEXIUM) 40 MG capsule Take 1 capsule by mouth daily.   09/12/2021   Eszopiclone 3 MG TABS    09/12/2021   furosemide (LASIX) 20 MG tablet Take 40 mg by mouth daily at 12 noon.   09/12/2021   metFORMIN (GLUCOPHAGE) 500 MG tablet Take 1,000 mg by mouth 2 (two) times daily with a meal.   09/12/2021   metoprolol tartrate (LOPRESSOR) 25 MG tablet Take 0.5 tablets (12.5 mg total) by mouth 2 (two) times daily. (Patient taking differently:  Take 50 mg by mouth 2 (two) times daily.)   09/12/2021   potassium chloride SA (KLOR-CON) 20 MEQ tablet Take 20 mEq by mouth in the morning.   09/12/2021   PRALUENT 150 MG/ML SOAJ Inject into the skin.   09/12/2021   heparin 25000 UT/250ML infusion Inject 1,300 Units/hr into the vein continuous. (Patient not taking: No sig reported)       Assessment: Pharmacy consulted to dose heparin in this 75 year old male admitted with DVT.  CrCl = 57.3 ml/min. Med rec states pt was on heparin drip at home but no longer takes, will order baseline HL to confirm.   11/8 0937 HL >1.10 supratherapeutic - spoke with nurse who drew level who stated she flushed the line several times and obtained "empty" draws prior th HL draw  11/9 0150 HL = 0.66, therapeutic X 1   Goal of Therapy:  Heparin level 0.3-0.7 units/ml Monitor platelets by anticoagulation protocol: Yes   Plan:  Pt went for thrombectomy 11/8. Per MD restart heparin at previous rate of 1200 units/hr with no bolus starting at 1800. Okay to follow nomogram thereafter.  11/9:  HL @ 0150 = 0.66 , therapeutic X 1 Will continue pt on current rate and draw confirmation level on 11/9 @ 1000.   Maisey Deandrade D 09/14/2021,3:02 AM

## 2021-09-14 NOTE — Progress Notes (Signed)
[facility] Daily Progress Note  09/13/21: 1.   Ultrasound guidance for vascular access to the right popliteal vein 2.   Introduction catheter into venous system second order catheter placement right popliteal approach 3.   Inferior venacavogram 4.   Infusion thrombolysis with 10 mg of TPA 5.   Mechanical thrombectomy of the right popliteal, SFV, common femoral vein and the right external and common iliac veins using the Penumbr CAT 12 lightning catheter. 6.   Percutaneous transluminal angioplasty to 12 mm right common femoral vein.  Subjective: Patient without complaint this afternoon.  No acute issues overnight.  Notes improvements today discomfort to the right lower extremity.  Objective: Vitals:   09/13/21 1945 09/14/21 0436 09/14/21 0700 09/14/21 1228  BP: 100/65 113/71 111/70 119/79  Pulse: 99 83 94 91  Resp: 18 18 16 20   Temp: 98 F (36.7 C) 97.7 F (36.5 C) 97.7 F (36.5 C) 97.9 F (36.6 C)  TempSrc: Oral Oral Oral Oral  SpO2: 96% 96% 98% 96%  Weight:      Height:        Intake/Output Summary (Last 24 hours) at 09/14/2021 1306 Last data filed at 09/14/2021 1018 Gross per 24 hour  Intake 561.52 ml  Output 275 ml  Net 286.52 ml   Physical Exam: A&Ox3, NAD CV: RRR Pulmonary: CTA Bilaterally Abdomen: Soft, Nontender, Nondistended Vascular:  Right lower extremity: Compression dressing removed.  Thigh soft.  Calf soft.  Extremities warm distally toes.  Improvement in edema.  Motor/sensory is intact.  Access site clean dry and intact.   Laboratory: CBC    Component Value Date/Time   WBC 6.7 09/14/2021 0150   HGB 12.0 (L) 09/14/2021 0150   HCT 34.6 (L) 09/14/2021 0150   PLT 180 09/14/2021 0150   BMET    Component Value Date/Time   NA 138 09/14/2021 0150   K 3.3 (L) 09/14/2021 0150   CL 106 09/14/2021 0150   CO2 25 09/14/2021 0150   GLUCOSE 212 (H) 09/14/2021 0150   BUN 14 09/14/2021 0150   CREATININE 0.91 09/14/2021 0150   CALCIUM 7.9  (L) 09/14/2021 0150   GFRNONAA >60 09/14/2021 0150   GFRAA 56 (L) 11/12/2017 1816   Assessment/Planning: The patient is a 75 year old male with multiple medical issues who presented with extensive DVT to the right lower extremity status post thrombectomy/thrombolysis - POD#1  1) compression dressing removed this afternoon.  Improvement in the patient's edema. 2) Heparin has been stopped and transition to Eliquis with a loading dose. 3) okay for the patient to be up and ambulating 4) no further recommendations from vascular surgery at this time.  Okay for the patient to be discharged home when medically stable 5) discharge instructions/follow-up abdomen entered.  Discussed with Dr. Eber Hong Urho Rio PA-C 09/14/2021 1:06 PM

## 2021-09-14 NOTE — Discharge Summary (Signed)
[facility] [place] NAME: Ralph Marsh    MR#:  [id number]  DATE OF BIRTH:  [birth year]  DATE OF ADMISSION:  09/12/2021 ADMITTING PHYSICIAN: Christel Mormon, MD  DATE OF DISCHARGE: 09/14/2021  1:32 PM  PRIMARY CARE PHYSICIAN: Marguerita Merles, MD    ADMISSION DIAGNOSIS:  Right leg DVT ([place]) [I82.401] Acute deep vein thrombosis (DVT) of proximal vein of right lower extremity ([place]) [I82.4Y1]  DISCHARGE DIAGNOSIS:  Active Problems:   Type 2 diabetes mellitus with hyperlipidemia (HCC)   Right leg DVT ([place])   SECONDARY DIAGNOSIS:   Past Medical History:  Diagnosis Date  . Arthritis   . BPH (benign prostatic hyperplasia)   . Diabetes mellitus, type 2 ([place])   . DVT (deep venous thrombosis) ([place]) 05/12/2017   left upper leg  . Emphysema of lung ([place])    mild - on xray  . Fatty liver   . GERD (gastroesophageal reflux disease)   . Hypertension   . Kidney stone     HOSPITAL COURSE:   Extensive right leg DVT extending from the femoral vein into the popliteal vein.  Patient initially started on heparin drip.  Dr. Delana Meyer did a mechanical thrombectomy, thrombolysis with 10 mg of tPA and percutaneous transluminal angioplasty to the right common femoral vein.  Patient converted from heparin drip over to Eliquis.  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 Back pain.  Likely musculoskeletal in nature from lying in the bed and being on the procedure table.  No blood in the urine.  Pain worse with bending and twisting.  Abdomen is soft and nontender so retroperitoneal bleed unlikely. Essential hypertension on metoprolol Type 2 diabetes mellitus with hyperlipidemia.  Hold Glucophage for another 2 days and then can restart. History of gout on allopurinol GERD on Protonix  DISCHARGE CONDITIONS:   Satisfactory  CONSULTS OBTAINED:  Treatment Team:  Katha Cabal, MD  DRUG ALLERGIES:   Allergies  Allergen  Reactions  . Augmentin [Amoxicillin-Pot Clavulanate] Anaphylaxis  . Latex Other (See Comments)    Nasal inflammation  . Other Anaphylaxis  . Solifenacin Other (See Comments)  . Sulfa Antibiotics Anaphylaxis  . Rosuvastatin     Other reaction(s): Muscle pain  . Atorvastatin     Other reaction(s): Muscle Pain, Pain in lower limb  . Omeprazole     Other reaction(s): Heartburn  . Pravastatin     Other reaction(s): Muscle pain  . Simvastatin Other (See Comments)    Other reaction(s): Fatigue, Muscle pain    DISCHARGE MEDICATIONS:   Allergies as of 09/14/2021       Reactions   Augmentin [amoxicillin-pot Clavulanate] Anaphylaxis   Latex Other (See Comments)   Nasal inflammation   Other Anaphylaxis   Solifenacin Other (See Comments)   Sulfa Antibiotics Anaphylaxis   Rosuvastatin    Other reaction(s): Muscle pain   Atorvastatin    Other reaction(s): Muscle Pain, Pain in lower limb   Omeprazole    Other reaction(s): Heartburn   Pravastatin    Other reaction(s): Muscle pain   Simvastatin Other (See Comments)   Other reaction(s): Fatigue, Muscle pain        Medication List     STOP taking these medications    heparin 25000 UT/250ML infusion   metFORMIN 500 MG tablet Commonly known as: GLUCOPHAGE       TAKE these medications    allopurinol 300 MG tablet Commonly known as: ZYLOPRIM Take  1 tablet by mouth daily.   apixaban 5 MG Tabs tablet Commonly known as: ELIQUIS Two tabs (10mg ) po twice a day for one week then 1 tab (5mg ) po twice a day   aspirin EC 81 MG tablet Take 81 mg by mouth daily.   Cholecalciferol 25 MCG (1000 UT) tablet Take 1,000 Units by mouth 2 (two) times daily.   esomeprazole 40 MG capsule Commonly known as: NEXIUM Take 1 capsule by mouth daily.   Eszopiclone 3 MG Tabs Take 3 mg by mouth at bedtime as needed (sleep).   furosemide 20 MG tablet Commonly known as: LASIX Take 40 mg by mouth daily at 12 noon.   metoprolol tartrate 25  MG tablet Commonly known as: LOPRESSOR Take 0.5 tablets (12.5 mg total) by mouth 2 (two) times daily. What changed: how much to take   potassium chloride SA 20 MEQ tablet Commonly known as: KLOR-CON Take 20 mEq by mouth in the morning.   Praluent 150 MG/ML [place] Generic drug: Alirocumab Inject into the skin.         DISCHARGE INSTRUCTIONS:   Follow-up PMD 5 days Follow-up vascular surgery  If you experience worsening of your admission symptoms, develop shortness of breath, life threatening emergency, suicidal or homicidal thoughts you must seek medical attention immediately by calling 911 or calling your MD immediately  if symptoms less severe.  You Must read complete instructions/literature along with all the possible adverse reactions/side effects for all the Medicines you take and that have been prescribed to you. Take any new Medicines after you have completely understood and accept all the possible adverse reactions/side effects.   Please note  You were cared for by a hospitalist during your hospital stay. If you have any questions about your discharge medications or the care you received while you were in the hospital after you are discharged, you can call the unit and asked to speak with the hospitalist on call if the hospitalist that took care of you is not available. Once you are discharged, your primary care physician will handle any further medical issues. Please note that NO REFILLS for any discharge medications will be authorized once you are discharged, as it is imperative that you return to your primary care physician (or establish a relationship with a primary care physician if you do not have one) for your aftercare needs so that they can reassess your need for medications and monitor your lab values.    Today   CHIEF COMPLAINT:   Chief Complaint  Patient presents with  . Leg Pain    HISTORY OF PRESENT ILLNESS:  Ralph Marsh  is a 75 y.o. male came in with  right leg pain and swelling   VITAL SIGNS:  Blood pressure 119/79, pulse 91, temperature 97.9 F (36.6 C), temperature source Oral, resp. rate 20, height 6\' 1"  (1.854 m), weight 88 kg, SpO2 96 %.  I/O:   Intake/Output Summary (Last 24 hours) at 09/14/2021 1611 Last data filed at 09/14/2021 1018 Gross per 24 hour  Intake 561.52 ml  Output 150 ml  Net 411.52 ml    PHYSICAL EXAMINATION:  GENERAL:  75 y.o.-year-old patient lying in the bed with no acute distress.  EYES: Pupils equal, round, reactive to light and accommodation. No scleral icterus.  HEENT: Head atraumatic, normocephalic. Oropharynx and nasopharynx clear.   LUNGS: Normal breath sounds bilaterally, no wheezing, rales,rhonchi or crepitation. No use of accessory muscles of respiration.  CARDIOVASCULAR: S1, S2 normal. No murmurs, rubs, or  gallops.  ABDOMEN: Soft, non-tender, non-distended. Bowel sounds present. No organomegaly or mass.  EXTREMITIES: No pedal edema, cyanosis, or clubbing.  NEUROLOGIC: Cranial nerves II through XII are intact. Muscle strength 5/5 in all extremities. Sensation intact. Gait not checked.  PSYCHIATRIC: The patient is alert and oriented x 3.  SKIN: No obvious rash, lesion, or ulcer.   DATA REVIEW:   CBC Recent Labs  Lab 09/14/21 0150  WBC 6.7  HGB 12.0*  HCT 34.6*  PLT 180    Chemistries  Recent Labs  Lab 09/12/21 2039 09/13/21 0618 09/14/21 0150  NA 137   < > 138  K 3.6   < > 3.3*  CL 105   < > 106  CO2 24   < > 25  GLUCOSE 144*   < > 212*  BUN 18   < > 14  CREATININE 1.22   < > 0.91  CALCIUM 8.4*   < > 7.9*  AST 18  --   --   ALT 21  --   --   ALKPHOS 107  --   --   BILITOT 0.7  --   --    < > = values in this interval not displayed.     Microbiology Results  Results for orders placed or performed during the hospital encounter of 09/12/21  Resp Panel by RT-PCR (Flu A&B, Covid) Nasopharyngeal Swab     Status: None   Collection Time: 09/12/21 11:06 PM   Specimen:  Nasopharyngeal Swab; Nasopharyngeal(NP) swabs in vial transport medium  Result Value Ref Range Status   SARS Coronavirus 2 by RT PCR NEGATIVE NEGATIVE Final    Comment: (NOTE) SARS-CoV-2 target nucleic acids are NOT DETECTED.  The SARS-CoV-2 RNA is generally detectable in upper respiratory specimens during the acute phase of infection. The lowest concentration of SARS-CoV-2 viral copies this assay can detect is 138 copies/mL. A negative result does not preclude SARS-Cov-2 infection and should not be used as the sole basis for treatment or other patient management decisions. A negative result may occur with  improper specimen collection/handling, submission of specimen other than nasopharyngeal swab, presence of viral mutation(s) within the areas targeted by this assay, and inadequate number of viral copies(<138 copies/mL). A negative result must be combined with clinical observations, patient history, and epidemiological information. The expected result is Negative.  Fact Sheet for Patients:  EntrepreneurPulse.com.au  Fact Sheet for Healthcare Providers:  IncredibleEmployment.be  This test is no t yet approved or cleared by the [place] FDA and  has been authorized for detection and/or diagnosis of SARS-CoV-2 by FDA under an Emergency Use Authorization (EUA). This EUA will remain  in effect (meaning this test can be used) for the duration of the COVID-19 declaration under Section 564(b)(1) of the Act, 21 U.S.C.section 360bbb-3(b)(1), unless the authorization is terminated  or revoked sooner.       Influenza A by PCR NEGATIVE NEGATIVE Final   Influenza B by PCR NEGATIVE NEGATIVE Final    Comment: (NOTE) The Xpert Xpress SARS-CoV-2/FLU/RSV plus assay is intended as an aid in the diagnosis of influenza from Nasopharyngeal swab specimens and should not be used as a sole basis for treatment. Nasal washings and aspirates are unacceptable for  Xpert Xpress SARS-CoV-2/FLU/RSV testing.  Fact Sheet for Patients: EntrepreneurPulse.com.au  Fact Sheet for Healthcare Providers: IncredibleEmployment.be  This test is not yet approved or cleared by the [place] FDA and has been authorized for detection and/or diagnosis of SARS-CoV-2 by FDA under an  Emergency Use Authorization (EUA). This EUA will remain in effect (meaning this test can be used) for the duration of the COVID-19 declaration under Section 564(b)(1) of the Act, 21 U.S.C. section 360bbb-3(b)(1), unless the authorization is terminated or revoked.  Performed at [facility], [address] [place], [place] [address]     RADIOLOGY:  CT Angio Chest PE W and/or Wo Contrast  Result Date: 09/12/2021 CLINICAL DATA:  Lower extremity swelling EXAM: CT ANGIOGRAPHY CHEST WITH CONTRAST TECHNIQUE: Multidetector CT imaging of the chest was performed using the standard protocol during bolus administration of intravenous contrast. Multiplanar CT image reconstructions and MIPs were obtained to evaluate the vascular anatomy. CONTRAST:  6mL OMNIPAQUE IOHEXOL 350 MG/ML SOLN COMPARISON:  Chest CT 05/14/2021 FINDINGS: Cardiovascular: Satisfactory opacification of the pulmonary arteries to the segmental level. No evidence of pulmonary embolism. Normal heart size. No pericardial effusion. Post CABG changes. Coronary vascular calcification Mediastinum/Nodes: Midline trachea. No thyroid mass. No suspicious adenopathy. Esophagus within normal limits. Fat containing hiatal hernia. Lungs/Pleura: Lungs are clear. No pleural effusion or pneumothorax. Upper Abdomen: No acute abnormality.  Gallstones. Musculoskeletal: No chest wall abnormality. No acute or significant osseous findings. Review of the MIP images confirms the above findings. IMPRESSION: 1. Negative for acute pulmonary embolus. Negative for aortic dissection. 2. Clear lung fields 3. Gallstones  Aortic Atherosclerosis (ICD10-I70.0). Electronically Signed   By: Donavan Foil M.D.   On: 09/12/2021 22:51   PERIPHERAL VASCULAR CATHETERIZATION  Result Date: 09/13/2021 See surgical note for result.  US Venous Img Lower Unilateral Right  Result Date: 09/12/2021 CLINICAL DATA:  Right leg swelling EXAM: RIGHT LOWER EXTREMITY VENOUS DOPPLER ULTRASOUND TECHNIQUE: Gray-scale sonography with graded compression, as well as color Doppler and duplex ultrasound were performed to evaluate the lower extremity deep venous systems from the level of the common femoral vein and including the common femoral, femoral, profunda femoral, popliteal and calf veins including the posterior tibial, peroneal and gastrocnemius veins when visible. The superficial great saphenous vein was also interrogated. Spectral Doppler was utilized to evaluate flow at rest and with distal augmentation maneuvers in the common femoral, femoral and popliteal veins. COMPARISON:  None. FINDINGS: Contralateral Common Femoral Vein: No evidence of thrombus. Normal compressibility and flow on color Doppler imaging. Common Femoral Vein: Occlusive thrombus. Vessel is noncompressible. Saphenofemoral Junction: Occlusive thrombus. Vessel is noncompressible. Profunda Femoral Vein: Occlusive thrombus. Vessel is noncompressible. Femoral Vein: Occlusive thrombus. Vessel is noncompressible. Popliteal Vein: Occlusive thrombus. Vessel is noncompressible. Calf Veins: No evidence of thrombus. Normal compressibility and flow on color Doppler imaging. Superficial Great Saphenous Vein: No evidence of thrombus. Normal compressibility. Venous Reflux:  None. Other Findings:  None. IMPRESSION: Extensive occlusive deep venous thrombosis within the right lower extremity extending from the common femoral vein to the popliteal vein. These results were called by telephone at the time of interpretation on 09/12/2021 at 8:30 pm to provider Dr Jari Pigg, who verbally acknowledged these  results. Electronically Signed   By: Davina Poke D.O.   On: 09/12/2021 20:30       Management plans discussed with the patient, family and they are in agreement.  CODE STATUS:     Code Status Orders  (From admission, onward)           Start     Ordered   09/13/21 0002  Full code  Continuous        09/13/21 0007           Code Status History  Date Active Date Inactive Code Status Order ID Comments User Context   05/14/2021 1959 05/18/2021 1931 Full Code [id number]  Clarnce Flock, MD ED   05/13/2017 0201 05/13/2017 1856 Full Code [id number]  Harvie Bridge, DO Inpatient   01/20/2017 0541 01/21/2017 1706 Full Code [id number]  Saundra Shelling, MD Inpatient      Advance Directive Documentation    Flowsheet Row Most Recent Value  Type of Advance Directive Healthcare Power of Attorney  Pre-existing out of facility DNR order (yellow form or pink MOST form) --  "MOST" Form in Place? --       TOTAL TIME TAKING CARE OF THIS PATIENT: 40 minutes.    Loletha Grayer M.D on 09/14/2021 at 4:11 PM    [facility]  CC: Primary care physician; Marguerita Merles, MD

## 2021-09-14 NOTE — Plan of Care (Signed)

## 2021-09-14 NOTE — Consult Note (Signed)
ANTICOAGULATION CONSULT NOTE - Initial Consult  Pharmacy Consult for apixaban Indication: DVT  Allergies  Allergen Reactions   Augmentin [Amoxicillin-Pot Clavulanate] Anaphylaxis   Latex Other (See Comments)    Nasal inflammation   Other Anaphylaxis   Solifenacin Other (See Comments)   Sulfa Antibiotics Anaphylaxis   Rosuvastatin     Other reaction(s): Muscle pain   Atorvastatin     Other reaction(s): Muscle Pain, Pain in lower limb   Omeprazole     Other reaction(s): Heartburn   Pravastatin     Other reaction(s): Muscle pain   Simvastatin Other (See Comments)    Other reaction(s): Fatigue, Muscle pain    Patient Measurements: Height: 6\' 1"  (185.4 cm) Weight: 88 kg (194 lb) IBW/kg (Calculated) : 79.9  Vital Signs: Temp: 97.7 F (36.5 C) (11/09 0700) Temp Source: Oral (11/09 0700) BP: 111/70 (11/09 0700) Pulse Rate: 94 (11/09 0700)  Labs: Recent Labs    09/12/21 1857 09/12/21 2039 09/12/21 2050 09/13/21 0022 09/13/21 0618 09/13/21 0937 09/14/21 0150  HGB 14.7  --   --   --  12.9*  --  12.0*  HCT 42.2  --   --   --  38.8*  --  34.6*  PLT 206  --   --   --  196  --  180  APTT  --   --   --  >200*  --   --   --   LABPROT  --   --   --  15.3*  --   --   --   INR  --   --   --  1.2  --   --   --   HEPARINUNFRC  --   --   --  0.77*  --  >1.10* 0.66  CREATININE  --  1.22  --   --  1.06  --  0.91  TROPONINIHS 10  --  10  --   --   --   --     Estimated Creatinine Clearance: 76.8 mL/min (by C-G formula based on SCr of 0.91 mg/dL).   Medical History: Past Medical History:  Diagnosis Date   Arthritis    BPH (benign prostatic hyperplasia)    Diabetes mellitus, type 2 (HCC)    DVT (deep venous thrombosis) ([place]) 05/12/2017   left upper leg   Emphysema of lung (HCC)    mild - on xray   Fatty liver    GERD (gastroesophageal reflux disease)    Hypertension    Kidney stone      Assessment:  75 year old male admitted with DVT started  on heparin infusion s/p  thrombectomy. Pharmacy has been consulted to transition to oral apixaban therapy.   Goal of Therapy:  Monitor platelets by anticoagulation protocol: Yes   Plan:  Discontinue heparin infusion Initiate apixaban 10 mg BID x 7 days, followed by apixaban 5 mg BID thereafter   Dorothe Pea, PharmD, BCPS Clinical Pharmacist   09/14/2021,8:48 AM

## 2021-09-14 NOTE — TOC Progression Note (Signed)
Transition of Care [facility]) - Progression Note    Patient Details  Name: Ralph Marsh MRN: [id number] Date of Birth: [birth year]  [facility] CM/SW Contact  Beverly Sessions, RN Phone Number: 09/14/2021, 10:36 AM  Clinical Narrative:     [facility].  They confirm they have already received 2 notifications of this presentation  Expected Discharge Plan: Home/Self Care Barriers to Discharge: Continued Medical Work up  Expected Discharge Plan and Services Expected Discharge Plan: Home/Self Care In-house Referral: Clinical Social Work   Post Acute Care Choice: NA Living arrangements for the past 2 months: Single Family Home                                       Social Determinants of Health (SDOH) Interventions    Readmission Risk Interventions No flowsheet data found.

## 2021-09-19 ENCOUNTER — Encounter: Payer: Self-pay | Admitting: *Deleted

## 2021-09-19 ENCOUNTER — Encounter: Payer: No Typology Code available for payment source | Attending: Family Medicine

## 2021-09-19 DIAGNOSIS — Z951 Presence of aortocoronary bypass graft: Secondary | ICD-10-CM

## 2021-09-21 NOTE — Progress Notes (Addendum)
MRN : [id number]  Ralph Marsh is a 75 y.o. ([birth year]) male who presents with chief complaint of check leg.  History of Present Illness:    The patient presents to the office for evaluation of DVT.  DVT was identified at [facility] by Duplex ultrasound.  The initial symptoms were pain and swelling in the lower extremity.  Procedure on 09/13/2021:  Mechanical thrombectomy of the right popliteal, SFV, common femoral vein and the right external and common iliac veins using the Penumbr CAT 12 lightning catheter. 2.    Percutaneous transluminal angioplasty to 12 mm right common femoral vein.  The patient notes his right leg is minimally painful with dependency with some swelling.  Overall he states that his leg swelling is practically gone.  Symptoms are much better with elevation.  The patient notes minimal edema in the morning which steadily worsens throughout the day.    The patient has not been using compression therapy at this point.  No SOB or pleuritic chest pains.  No cough or hemoptysis.  No blood per rectum or blood in any sputum.  No excessive bruising per the patient.  Duplex ultrasound of the right lower extremity venous system demonstrates partially recanalized popliteal and femoral vein.  Common femoral vein appears widely patent.  There is normal phasic flow in the external iliac vein as well    No outpatient medications have been marked as taking for the 09/22/21 encounter (Appointment) with Delana Meyer, Dolores Lory, MD.    Past Medical History:  Diagnosis Date   Arthritis    BPH (benign prostatic hyperplasia)    Diabetes mellitus, type 2 ([place])    DVT (deep venous thrombosis) ([place]) 05/12/2017   left upper leg   Emphysema of lung ([place])    mild - on xray   Fatty liver    GERD (gastroesophageal reflux disease)    Hypertension    Kidney stone     Past Surgical History:  Procedure Laterality Date   CATARACT EXTRACTION W/PHACO Right 09/11/2018   Procedure: CATARACT  EXTRACTION PHACO AND INTRAOCULAR LENS PLACEMENT ([place])  RIGHT DIABETIC;  Surgeon: Leandrew Koyanagi, MD;  Location: [place];  Service: Ophthalmology;  Laterality: Right;  Diabetic - oral meds Latex sensitiviy   CATARACT EXTRACTION W/PHACO Left 10/09/2018   Procedure: CATARACT EXTRACTION PHACO AND INTRAOCULAR LENS PLACEMENT ([place])  LEFT DIABETIC;  Surgeon: Leandrew Koyanagi, MD;  Location: [place];  Service: Ophthalmology;  Laterality: Left;  Diabetic - oral meds Latex sensitivity   COLONOSCOPY WITH PROPOFOL N/A 07/28/2019   Procedure: COLONOSCOPY WITH PROPOFOL;  Surgeon: Toledo, Benay Pike, MD;  Location: [facility];  Service: Gastroenterology;  Laterality: N/A;   EXTRACORPOREAL SHOCK WAVE LITHOTRIPSY Left 01/25/2017   Procedure: EXTRACORPOREAL SHOCK WAVE LITHOTRIPSY (ESWL);  Surgeon: Nickie Retort, MD;  Location: [facility];  Service: Urology;  Laterality: Left;   LASER OF PROSTATE W/ GREEN LIGHT PVP  2008   Dr. Quillian Quince   LEFT HEART CATH AND CORONARY ANGIOGRAPHY N/A 05/16/2021   Procedure: LEFT HEART CATH AND CORONARY ANGIOGRAPHY;  Surgeon: Corey Skains, MD;  Location: [place] [facility];  Service: Cardiovascular;  Laterality: N/A;   LUMBAR LAMINECTOMY     PERIPHERAL VASCULAR THROMBECTOMY Right 09/13/2021   Procedure: PERIPHERAL VASCULAR THROMBECTOMY;  Surgeon: Katha Cabal, MD;  Location: [place] [facility];  Service: Cardiovascular;  Laterality: Right;   TONSILLECTOMY AND ADENOIDECTOMY     URETEROSCOPY WITH HOLMIUM LASER LITHOTRIPSY  2004    Social History  Social History   Tobacco Use   Smoking status: Former    Packs/day: 1.00    Years: 15.00    Pack years: 15.00    Types: Cigarettes    Quit date: 1995    Years since quitting: 27.8   Smokeless tobacco: Never  Vaping Use   Vaping Use: Never used  Substance Use Topics   Alcohol use: Yes    Alcohol/week: 0.0 standard drinks    Comment: Holidays only   Drug use: No    Family  History Family History  Problem Relation Age of Onset   Kidney disease Neg Hx    Kidney cancer Neg Hx    Prostate cancer Neg Hx     Allergies  Allergen Reactions   Augmentin [Amoxicillin-Pot Clavulanate] Anaphylaxis   Latex Other (See Comments)    Nasal inflammation   Other Anaphylaxis   Solifenacin Other (See Comments)   Sulfa Antibiotics Anaphylaxis   Rosuvastatin     Other reaction(s): Muscle pain   Atorvastatin     Other reaction(s): Muscle Pain, Pain in lower limb   Omeprazole     Other reaction(s): Heartburn   Pravastatin     Other reaction(s): Muscle pain   Simvastatin Other (See Comments)    Other reaction(s): Fatigue, Muscle pain     REVIEW OF SYSTEMS (Negative unless checked)  Constitutional: [] Weight loss  [] Fever  [] Chills Cardiac: [] Chest pain   [] Chest pressure   [] Palpitations   [] Shortness of breath when laying flat   [] Shortness of breath with exertion. Vascular:  [] Pain in legs with walking   [x] Pain in legs at rest  [x] History of DVT   [] Phlebitis   [x] Swelling in legs   [] Varicose veins   [] Non-healing ulcers Pulmonary:   [] Uses home oxygen   [] Productive cough   [] Hemoptysis   [] Wheeze  [] COPD   [] Asthma Neurologic:  [] Dizziness   [] Seizures   [] History of stroke   [] History of TIA  [] Aphasia   [] Vissual changes   [] Weakness or numbness in arm   [] Weakness or numbness in leg Musculoskeletal:   [] Joint swelling   [] Joint pain   [] Low back pain Hematologic:  [] Easy bruising  [] Easy bleeding   [] Hypercoagulable state   [] Anemic Gastrointestinal:  [] Diarrhea   [] Vomiting  [x] Gastroesophageal reflux/heartburn   [] Difficulty swallowing. Genitourinary:  [] Chronic kidney disease   [] Difficult urination  [] Frequent urination   [] Blood in urine Skin:  [] Rashes   [] Ulcers  Psychological:  [] History of anxiety   []  History of major depression.  Physical Examination  There were no vitals filed for this visit. There is no height or weight on file to calculate  BMI. Gen: WD/WN, NAD Head: [place]/AT, No temporalis wasting.  Ear/Nose/Throat: Hearing grossly intact, nares w/o erythema or drainage, pinna without lesions Eyes: PER, EOMI, sclera nonicteric.  Neck: Supple, no gross masses.  No JVD.  Pulmonary:  Good air movement, no audible wheezing, no use of accessory muscles.  Cardiac: RRR, precordium not hyperdynamic. Vascular:  scattered varicosities present bilaterally.  Mild venous stasis changes to the legs bilaterally.  2+ soft pitting edema right leg slightly more affected than left Vessel Right Left  Radial Palpable Palpable  Gastrointestinal: soft, non-distended. No guarding/no peritoneal signs.  Musculoskeletal: M/S 5/5 throughout.  No deformity.  Neurologic: CN 2-12 intact. Pain and light touch intact in extremities.  Symmetrical.  Speech is fluent. Motor exam as listed above. Psychiatric: Judgment intact, Mood & affect appropriate for pt's clinical situation. Dermatologic: Venous rashes no ulcers  noted.  No changes consistent with cellulitis. Lymph : No lichenification or skin changes of chronic lymphedema.  CBC Lab Results  Component Value Date   WBC 6.7 09/14/2021   HGB 12.0 (L) 09/14/2021   HCT 34.6 (L) 09/14/2021   MCV 90.6 09/14/2021   PLT 180 09/14/2021    BMET    Component Value Date/Time   NA 138 09/14/2021 0150   K 3.3 (L) 09/14/2021 0150   CL 106 09/14/2021 0150   CO2 25 09/14/2021 0150   GLUCOSE 212 (H) 09/14/2021 0150   BUN 14 09/14/2021 0150   CREATININE 0.91 09/14/2021 0150   CALCIUM 7.9 (L) 09/14/2021 0150   GFRNONAA >60 09/14/2021 0150   GFRAA 56 (L) 11/12/2017 1816   Estimated Creatinine Clearance: 76.8 mL/min (by C-G formula based on SCr of 0.91 mg/dL).  COAG Lab Results  Component Value Date   INR 1.2 09/13/2021   INR 1.0 05/14/2021   INR 0.97 05/13/2017    Radiology CT Angio Chest PE W and/or Wo Contrast  Result Date: 09/12/2021 CLINICAL DATA:  Lower extremity swelling EXAM: CT ANGIOGRAPHY CHEST  WITH CONTRAST TECHNIQUE: Multidetector CT imaging of the chest was performed using the standard protocol during bolus administration of intravenous contrast. Multiplanar CT image reconstructions and MIPs were obtained to evaluate the vascular anatomy. CONTRAST:  52mL OMNIPAQUE IOHEXOL 350 MG/ML SOLN COMPARISON:  Chest CT 05/14/2021 FINDINGS: Cardiovascular: Satisfactory opacification of the pulmonary arteries to the segmental level. No evidence of pulmonary embolism. Normal heart size. No pericardial effusion. Post CABG changes. Coronary vascular calcification Mediastinum/Nodes: Midline trachea. No thyroid mass. No suspicious adenopathy. Esophagus within normal limits. Fat containing hiatal hernia. Lungs/Pleura: Lungs are clear. No pleural effusion or pneumothorax. Upper Abdomen: No acute abnormality.  Gallstones. Musculoskeletal: No chest wall abnormality. No acute or significant osseous findings. Review of the MIP images confirms the above findings. IMPRESSION: 1. Negative for acute pulmonary embolus. Negative for aortic dissection. 2. Clear lung fields 3. Gallstones Aortic Atherosclerosis (ICD10-I70.0). Electronically Signed   By: Donavan Foil M.D.   On: 09/12/2021 22:51   PERIPHERAL VASCULAR CATHETERIZATION  Result Date: 09/13/2021 See surgical note for result.  US Venous Img Lower Unilateral Right  Result Date: 09/12/2021 CLINICAL DATA:  Right leg swelling EXAM: RIGHT LOWER EXTREMITY VENOUS DOPPLER ULTRASOUND TECHNIQUE: Gray-scale sonography with graded compression, as well as color Doppler and duplex ultrasound were performed to evaluate the lower extremity deep venous systems from the level of the common femoral vein and including the common femoral, femoral, profunda femoral, popliteal and calf veins including the posterior tibial, peroneal and gastrocnemius veins when visible. The superficial great saphenous vein was also interrogated. Spectral Doppler was utilized to evaluate flow at rest and with  distal augmentation maneuvers in the common femoral, femoral and popliteal veins. COMPARISON:  None. FINDINGS: Contralateral Common Femoral Vein: No evidence of thrombus. Normal compressibility and flow on color Doppler imaging. Common Femoral Vein: Occlusive thrombus. Vessel is noncompressible. Saphenofemoral Junction: Occlusive thrombus. Vessel is noncompressible. Profunda Femoral Vein: Occlusive thrombus. Vessel is noncompressible. Femoral Vein: Occlusive thrombus. Vessel is noncompressible. Popliteal Vein: Occlusive thrombus. Vessel is noncompressible. Calf Veins: No evidence of thrombus. Normal compressibility and flow on color Doppler imaging. Superficial Great Saphenous Vein: No evidence of thrombus. Normal compressibility. Venous Reflux:  None. Other Findings:  None. IMPRESSION: Extensive occlusive deep venous thrombosis within the right lower extremity extending from the common femoral vein to the popliteal vein. These results were called by telephone at the time of interpretation on  09/12/2021 at 8:30 pm to provider Dr Jari Pigg, who verbally acknowledged these results. Electronically Signed   By: Davina Poke D.O.   On: 09/12/2021 20:30     Assessment/Plan 1. Acute deep vein thrombosis (DVT) of femoral vein of left lower extremity (HCC) Recommend:   No surgery or intervention at this point in time.  His right lower extremity symptoms are much improved. IVC filter is not indicated at present.  Patient's duplex ultrasound of the venous system shows early recanalization from the popliteal to the femoral veins.  There is much improved flow in the iliac system  The patient is initiated on anticoagulation, he is taking Eliquis.  This is a recurrent DVT.  He had DVT remotely in the left lower extremity.  And therefore we will plan on anticoagulation lifelong.  Elevation was stressed, use of a recliner was discussed.  I have had a long discussion with the patient regarding DVT and post phlebitic  changes such as swelling and why it  causes symptoms such as pain.  The patient will wear graduated compression stockings, beginning after three full days of anticoagulation, on a daily basis a prescription was given. The patient will  beginning wearing the stockings first thing in the morning and removing them in the evening. The patient is instructed specifically not to sleep in the stockings.  In addition, behavioral modification including elevation during the day and avoidance of prolonged dependency will be initiated.    The patient will continue anticoagulation for now as there have not been any problems or complications at this point.  He will follow-up with me in 6 months.   - VAS [place] LOWER EXTREMITY VENOUS (DVT); Future  2. Coronary artery disease involving native coronary artery of native heart with angina pectoris ([place]) Continue cardiac and antihypertensive medications as already ordered and reviewed, no changes at this time.  He can resume his cardiac rehab with no restrictions  Continue statin as ordered and reviewed, no changes at this time  Nitrates PRN for chest pain   3. Essential hypertension Continue antihypertensive medications as already ordered, these medications have been reviewed and there are no changes at this time.   4. Type 2 diabetes mellitus with hyperlipidemia ([place]) Continue hypoglycemic medications as already ordered, these medications have been reviewed and there are no changes at this time.  Hgb A1C to be monitored as already arranged by primary service    Hortencia Pilar, MD  09/21/2021 7:52 AM

## 2021-09-22 ENCOUNTER — Other Ambulatory Visit: Payer: Self-pay

## 2021-09-22 ENCOUNTER — Ambulatory Visit (INDEPENDENT_AMBULATORY_CARE_PROVIDER_SITE_OTHER): Payer: No Typology Code available for payment source | Admitting: Vascular Surgery

## 2021-09-22 ENCOUNTER — Ambulatory Visit (INDEPENDENT_AMBULATORY_CARE_PROVIDER_SITE_OTHER): Payer: No Typology Code available for payment source

## 2021-09-22 ENCOUNTER — Encounter (INDEPENDENT_AMBULATORY_CARE_PROVIDER_SITE_OTHER): Payer: Self-pay | Admitting: Vascular Surgery

## 2021-09-22 ENCOUNTER — Other Ambulatory Visit (INDEPENDENT_AMBULATORY_CARE_PROVIDER_SITE_OTHER): Payer: Self-pay | Admitting: Vascular Surgery

## 2021-09-22 VITALS — BP 118/75 | HR 94 | Ht 73.0 in | Wt 198.0 lb

## 2021-09-22 DIAGNOSIS — I82411 Acute embolism and thrombosis of right femoral vein: Secondary | ICD-10-CM

## 2021-09-22 DIAGNOSIS — E1169 Type 2 diabetes mellitus with other specified complication: Secondary | ICD-10-CM

## 2021-09-22 DIAGNOSIS — I25119 Atherosclerotic heart disease of native coronary artery with unspecified angina pectoris: Secondary | ICD-10-CM

## 2021-09-22 DIAGNOSIS — E785 Hyperlipidemia, unspecified: Secondary | ICD-10-CM

## 2021-09-22 DIAGNOSIS — I10 Essential (primary) hypertension: Secondary | ICD-10-CM | POA: Diagnosis not present

## 2021-09-22 DIAGNOSIS — I82412 Acute embolism and thrombosis of left femoral vein: Secondary | ICD-10-CM | POA: Diagnosis not present

## 2021-09-22 NOTE — Addendum Note (Signed)
Addended by: Katha Cabal on: 09/22/2021 10:13 AM   Modules accepted: Orders

## 2021-09-26 ENCOUNTER — Encounter: Payer: No Typology Code available for payment source | Admitting: *Deleted

## 2021-09-26 ENCOUNTER — Other Ambulatory Visit: Payer: Self-pay

## 2021-09-26 ENCOUNTER — Telehealth (INDEPENDENT_AMBULATORY_CARE_PROVIDER_SITE_OTHER): Payer: Self-pay

## 2021-09-26 DIAGNOSIS — Z951 Presence of aortocoronary bypass graft: Secondary | ICD-10-CM | POA: Diagnosis present

## 2021-09-26 NOTE — Progress Notes (Signed)
Daily Session Note  Patient Details  Name: Ralph Marsh MRN: [id number] Date of Birth: 09/29/1946 Referring Provider:   Flowsheet Row Cardiac Rehab from 07/12/2021 in [facility] Cardiac [facility] Pulmonary Rehab  Referring Provider Brett Canales MD ([place])       Encounter Date: 09/26/2021  Check In:  Session Check In - 09/26/21 1111       Check-In   Supervising physician immediately available to respond to emergencies See telemetry face sheet for immediately available ER MD    Location [facility]-Cardiac & Pulmonary Rehab    Staff Present Renita Papa, RN Moises Blood, BS, ACSM CEP, Exercise Physiologist;Amanda Oletta Darter, BA, ACSM CEP, Exercise Physiologist;Kelly Rosalia Hammers, MPA, RN    Virtual Visit No    Medication changes reported     Yes    Comments on eliquis bid    Fall or balance concerns reported    No    Warm-up and Cool-down Performed on first and last piece of equipment    Resistance Training Performed Yes    VAD Patient? No    PAD/SET Patient? No      Pain Assessment   Currently in Pain? No/denies                Social History   Tobacco Use  Smoking Status Former   Packs/day: 1.00   Years: 15.00   Pack years: 15.00   Types: Cigarettes   Quit date: 1995   Years since quitting: 27.9  Smokeless Tobacco Never    Goals Met:  Independence with exercise equipment Exercise tolerated well No report of concerns or symptoms today Strength training completed today  Goals Unmet:  Not Applicable  Comments: Pt able to follow exercise prescription today without complaint.  Will continue to monitor for progression.    Dr. Emily Filbert is Medical Director for [facility].  Dr. Ottie Glazier is Medical Director for [facility] Pulmonary Rehabilitation.

## 2021-09-26 NOTE — Telephone Encounter (Signed)
The pts wife called and left a message on the nurses line wanting to know could her husband have a Refill on eliquis for a 30 day supply due to the lag in the delivery schedule. Please advise.

## 2021-09-28 ENCOUNTER — Other Ambulatory Visit: Payer: Self-pay

## 2021-09-28 DIAGNOSIS — Z951 Presence of aortocoronary bypass graft: Secondary | ICD-10-CM

## 2021-09-28 NOTE — Progress Notes (Signed)
Daily Session Note  Patient Details  Name: Ralph Marsh MRN: [id number] Date of Birth: 06/22/1946 Referring Provider:   Flowsheet Row Cardiac Rehab from 07/12/2021 in [facility] Cardiac and Pulmonary Rehab  Referring Provider Brett Canales MD ([place])       Encounter Date: 09/28/2021  Check In:  Session Check In - 09/28/21 1040       Check-In   Supervising physician immediately available to respond to emergencies See telemetry face sheet for immediately available ER MD    Location [facility]-Cardiac & Pulmonary Rehab    Staff Present Birdie Sons, MPA, Elveria Rising, BA, ACSM CEP, Exercise Physiologist;Joseph Tessie Fass, [place]    Virtual Visit No    Medication changes reported     No    Fall or balance concerns reported    No    Tobacco Cessation No Change    Warm-up and Cool-down Performed on first and last piece of equipment    Resistance Training Performed Yes    VAD Patient? No    PAD/SET Patient? No      Pain Assessment   Currently in Pain? No/denies                Social History   Tobacco Use  Smoking Status Former   Packs/day: 1.00   Years: 15.00   Pack years: 15.00   Types: Cigarettes   Quit date: 1995   Years since quitting: 27.9  Smokeless Tobacco Never    Goals Met:  Independence with exercise equipment Exercise tolerated well No report of concerns or symptoms today Strength training completed today  Goals Unmet:  Not Applicable  Comments: Pt able to follow exercise prescription today without complaint.  Will continue to monitor for progression.    Dr. Emily Filbert is Medical Director for [facility].  Dr. Ottie Glazier is Medical Director for [facility] Pulmonary Rehabilitation.

## 2021-10-04 DIAGNOSIS — Z951 Presence of aortocoronary bypass graft: Secondary | ICD-10-CM

## 2021-10-04 NOTE — Progress Notes (Signed)
Art only attended rehab 2 days during 30-day review cycle due to DVT - unable to review goals this cycle.

## 2021-10-05 ENCOUNTER — Encounter: Payer: Self-pay | Admitting: *Deleted

## 2021-10-05 ENCOUNTER — Other Ambulatory Visit: Payer: Self-pay

## 2021-10-05 DIAGNOSIS — Z951 Presence of aortocoronary bypass graft: Secondary | ICD-10-CM

## 2021-10-05 NOTE — Progress Notes (Signed)
Cardiac Individual Treatment Plan  Patient Details  Name: TARA WICH MRN: [id number] Date of Birth: Nov 02, 1946 Referring Provider:   Flowsheet Row Cardiac Rehab from 07/12/2021 in [facility] Cardiac and Pulmonary Rehab  Referring Provider Brett Canales MD ([place])       Initial Encounter Date:  Flowsheet Row Cardiac Rehab from 07/12/2021 in [facility] Cardiac and Pulmonary Rehab  Date 07/12/21       Visit Diagnosis: S/P CABG x 2  Patient's Home Medications on Admission:  Current Outpatient Medications:    allopurinol (ZYLOPRIM) 300 MG tablet, Take 1 tablet by mouth daily., Disp: , Rfl:    apixaban (ELIQUIS) 5 MG TABS tablet, Two tabs (64m) po twice a day for one week then 1 tab (54m po twice a day, Disp: 74 tablet, Rfl: 0   aspirin EC 81 MG tablet, Take 81 mg by mouth daily., Disp: , Rfl:    Cholecalciferol 1000 UNITS tablet, Take 1,000 Units by mouth 2 (two) times daily., Disp: , Rfl:    esomeprazole (NEXIUM) 40 MG capsule, Take 1 capsule by mouth daily., Disp: , Rfl:    Eszopiclone 3 MG TABS, Take 3 mg by mouth at bedtime as needed (sleep)., Disp: , Rfl:    furosemide (LASIX) 20 MG tablet, Take 40 mg by mouth daily at 12 noon., Disp: , Rfl:    metoprolol tartrate (LOPRESSOR) 25 MG tablet, Take 0.5 tablets (12.5 mg total) by mouth 2 (two) times daily. (Patient taking differently: Take 50 mg by mouth 2 (two) times daily.), Disp: , Rfl:    potassium chloride SA (KLOR-CON) 20 MEQ tablet, Take 20 mEq by mouth in the morning., Disp: , Rfl:    PRALUENT 150 MG/ML SOAJ, Inject into the skin., Disp: , Rfl:   Past Medical History: Past Medical History:  Diagnosis Date   Arthritis    BPH (benign prostatic hyperplasia)    Diabetes mellitus, type 2 ([facility]   DVT (deep venous thrombosis) ([facility]07/05/2017   left upper leg   Emphysema of lung (HCC)    mild - on xray   Fatty liver    GERD (gastroesophageal reflux disease)    Hypertension    Kidney stone     Tobacco Use: Social History    Tobacco Use  Smoking Status Former   Packs/day: 1.00   Years: 15.00   Pack years: 15.00   Types: Cigarettes   Quit date: 1995   Years since quitting: 27.9  Smokeless Tobacco Never    Labs: Recent Review FlScientist, physiological   Labs for ITP Cardiac and Pulmonary Rehab Latest Ref Rng & Units 05/15/2021 09/13/2021   Cholestrol 0 - 200 mg/dL 141 -   LDLCALC 0 - 99 mg/dL 68 -   HDL >40 mg/dL 34(L) -   Trlycerides <150 mg/dL 196(H) -   Hemoglobin A1c 4.8 - 5.6 % 8.2(H) 6.7(H)        Exercise Target Goals: Exercise Program Goal: Individual exercise prescription set using results from initial 6 min walk test and THRR while considering  patient's activity barriers and safety.   Exercise Prescription Goal: Initial exercise prescription builds to 30-45 minutes a day of aerobic activity, 2-3 days per week.  Home exercise guidelines will be given to patient during program as part of exercise prescription that the participant will acknowledge.   Education: Aerobic Exercise: - Group verbal and visual presentation on the components of exercise prescription. Introduces F.I.T.T principle from ACSM for exercise prescriptions.  Reviews F.I.T.T. principles of  aerobic exercise including progression. Written material given at graduation. Flowsheet Row Cardiac Rehab from 09/28/2021 in [facility] Cardiac and Pulmonary Rehab  Education need identified 07/12/21  Date 08/31/21  Educator [facility]  Instruction Review Code 1- Verbalizes Understanding       Education: Resistance Exercise: - Group verbal and visual presentation on the components of exercise prescription. Introduces F.I.T.T principle from ACSM for exercise prescriptions  Reviews F.I.T.T. principles of resistance exercise including progression. Written material given at graduation. Flowsheet Row Cardiac Rehab from 09/28/2021 in [facility] Cardiac and Pulmonary Rehab  Education need identified 07/12/21        Education: Exercise & Equipment Safety: -  Individual verbal instruction and demonstration of equipment use and safety with use of the equipment. Flowsheet Row Cardiac Rehab from 09/28/2021 in [facility] Cardiac and Pulmonary Rehab  Date 07/12/21  Educator [facility]  Instruction Review Code 1- Verbalizes Understanding       Education: Exercise Physiology & General Exercise Guidelines: - Group verbal and written instruction with models to review the exercise physiology of the cardiovascular system and associated critical values. Provides general exercise guidelines with specific guidelines to those with heart or lung disease.  Flowsheet Row Cardiac Rehab from 09/28/2021 in [facility] Cardiac and Pulmonary Rehab  Education need identified 07/12/21  Date 08/24/21  Educator [facility]  Instruction Review Code 1- [employer] Understanding       Education: Flexibility, Balance, Mind/Body Relaxation: - Group verbal and visual presentation with interactive activity on the components of exercise prescription. Introduces F.I.T.T principle from ACSM for exercise prescriptions. Reviews F.I.T.T. principles of flexibility and balance exercise training including progression. Also discusses the mind body connection.  Reviews various relaxation techniques to help reduce and manage stress (i.e. Deep breathing, progressive muscle relaxation, and visualization). Balance handout provided to take home. Written material given at graduation.   Activity Barriers & Risk Stratification:  Activity Barriers & Cardiac Risk Stratification - 07/12/21 1014       Activity Barriers & Cardiac Risk Stratification   Activity Barriers Incisional Pain;Joint Problems;Deconditioning;Muscular Weakness;Shortness of Breath;Decreased Ventricular Function;Balance Concerns;Other (comment)    Comments Swelling/edema in R leg from vein harvest    Cardiac Risk Stratification High             6 Minute Walk:  6 Minute Walk     Row Name 07/12/21 1013         6 Minute Walk   Phase Initial      Distance 890 feet     Walk Time 6 minutes     # of Rest Breaks 0     MPH 1.67     METS 2.09     RPE 11     Perceived Dyspnea  1     VO2 Peak 7.32     Symptoms Yes (comment)     Comments leg pain from swelling (2/10), full inflation feels tight in chest     Resting HR 84 bpm     Resting BP 132/66     Resting Oxygen Saturation  94 %     Exercise Oxygen Saturation  during 6 min walk 96 %     Max Ex. HR 115 bpm     Max Ex. BP 136/74     2 Minute Post BP 112/64              Oxygen Initial Assessment:   Oxygen Re-Evaluation:   Oxygen Discharge (Final Oxygen Re-Evaluation):   Initial Exercise Prescription:  Initial Exercise Prescription - 07/12/21  1000       Date of Initial Exercise RX and Referring Provider   Date 07/12/21    Referring Provider Brett Canales MD ([place])      Treadmill   MPH 1.5    Grade 0.5    Minutes 15    METs 2.26      REL-XR   Level 1    Speed 50    Minutes 15    METs 1.5      T5 Nustep   Level 2    SPM 80    Minutes 15    METs 2      Track   Laps 23    Minutes 15    METs 2.25      Prescription Details   Frequency (times per week) 3    Duration Progress to 30 minutes of continuous aerobic without signs/symptoms of physical distress      Intensity   THRR 40-80% of Max Heartrate 108-131    Ratings of Perceived Exertion 11-13    Perceived Dyspnea 0-4      Progression   Progression Continue to progress workloads to maintain intensity without signs/symptoms of physical distress.      Resistance Training   Training Prescription Yes    Weight 3 lb    Reps 10-15             Perform Capillary Blood Glucose checks as needed.  Exercise Prescription Changes:   Exercise Prescription Changes     Row Name 07/12/21 1000 07/25/21 1300 08/08/21 0800 08/12/21 1100 08/22/21 1000     Response to Exercise   Blood Pressure (Admit) 132/66 120/62 108/60 -- 98/58   Blood Pressure (Exercise) 136/74 136/64 118/60 -- --   Blood Pressure  (Exit) 112/64 108/62 104/64 -- 122/62   Heart Rate (Admit) 84 bpm 81 bpm 103 bpm -- 78 bpm   Heart Rate (Exercise) 115 bpm 114 bpm 113 bpm -- 114 bpm   Heart Rate (Exit) 91 bpm 90 bpm 111 bpm -- 84 bpm   Oxygen Saturation (Admit) 94 % -- -- -- --   Oxygen Saturation (Exercise) 96 % -- -- -- --   Rating of Perceived Exertion (Exercise) '11 15 15 ' -- 12   Perceived Dyspnea (Exercise) 1 -- -- -- --   Symptoms leg pain 2/10, SOB (tight breathing) none none -- none   Comments walk test results -- -- -- --   Duration -- Continue with 30 min of aerobic exercise without signs/symptoms of physical distress. Continue with 30 min of aerobic exercise without signs/symptoms of physical distress. -- Continue with 30 min of aerobic exercise without signs/symptoms of physical distress.   Intensity -- THRR unchanged THRR unchanged -- THRR unchanged     Progression   Progression -- Continue to progress workloads to maintain intensity without signs/symptoms of physical distress. Continue to progress workloads to maintain intensity without signs/symptoms of physical distress. -- Continue to progress workloads to maintain intensity without signs/symptoms of physical distress.   Average METs -- 2.2 2.25 -- 2.43     Resistance Training   Training Prescription -- Yes Yes -- Yes   Weight -- 3 lb 3 lb -- 4 lb   Reps -- 10-15 10-15 -- 10-15     Interval Training   Interval Training -- No No -- No     REL-XR   Level -- 4 4 -- 7   Minutes -- 15 15 -- 15   METs -- 2.4 -- -- --  T5 Nustep   Level -- 4 -- -- 4   Minutes -- 15 -- -- 15   METs -- 2 -- -- 2.4     Track   Laps -- 22 21 -- 25   Minutes -- 15 15 -- 15   METs -- 2.2 2.14 -- 2.36     Home Exercise Plan   Plans to continue exercise at -- -- -- Home (comment)  walk Home (comment)  walk   Frequency -- -- -- Add 2 additional days to program exercise sessions. Add 2 additional days to program exercise sessions.   Initial Home Exercises Provided -- --  -- 08/12/21 08/12/21    Row Name 09/05/21 1600 10/03/21 1600           Response to Exercise   Blood Pressure (Admit) 122/62 102/64      Blood Pressure (Exit) 112/68 110/60      Heart Rate (Admit) 71 bpm 118 bpm      Heart Rate (Exercise) 116 bpm 117 bpm      Heart Rate (Exit) 80 bpm 100 bpm      Rating of Perceived Exertion (Exercise) 11 11      Symptoms none none      Duration Continue with 30 min of aerobic exercise without signs/symptoms of physical distress. Continue with 30 min of aerobic exercise without signs/symptoms of physical distress.      Intensity THRR unchanged THRR unchanged        Progression   Progression Continue to progress workloads to maintain intensity without signs/symptoms of physical distress. Continue to progress workloads to maintain intensity without signs/symptoms of physical distress.      Average METs 2.6 3        Resistance Training   Training Prescription Yes Yes      Weight 4 lb 4 lb      Reps 10-15 10-15        Interval Training   Interval Training No No        REL-XR   Level 5 5      Minutes 15 15      METs -- 4        Track   Laps 15 20      Minutes 15 15      METs 1.82 2.09        Home Exercise Plan   Plans to continue exercise at Home (comment)  walk Home (comment)  walk      Frequency Add 2 additional days to program exercise sessions. Add 2 additional days to program exercise sessions.      Initial Home Exercises Provided 08/12/21 08/12/21               Exercise Comments:   Exercise Goals and Review:   Exercise Goals     Row Name 07/12/21 1017             Exercise Goals   Increase Physical Activity Yes       Intervention Provide advice, education, support and counseling about physical activity/exercise needs.;Develop an individualized exercise prescription for aerobic and resistive training based on initial evaluation findings, risk stratification, comorbidities and participant's personal goals.        Expected Outcomes Short Term: Attend rehab on a regular basis to increase amount of physical activity.;Long Term: Add in home exercise to make exercise part of routine and to increase amount of physical activity.;Long Term: Exercising regularly at least 3-5 days a week.  Increase Strength and Stamina Yes       Intervention Provide advice, education, support and counseling about physical activity/exercise needs.;Develop an individualized exercise prescription for aerobic and resistive training based on initial evaluation findings, risk stratification, comorbidities and participant's personal goals.       Expected Outcomes Short Term: Perform resistance training exercises routinely during rehab and add in resistance training at home;Short Term: Increase workloads from initial exercise prescription for resistance, speed, and METs.;Long Term: Improve cardiorespiratory fitness, muscular endurance and strength as measured by increased METs and functional capacity (6MWT)       Able to understand and use rate of perceived exertion (RPE) scale Yes       Intervention Provide education and explanation on how to use RPE scale       Expected Outcomes Short Term: Able to use RPE daily in rehab to express subjective intensity level;Long Term:  Able to use RPE to guide intensity level when exercising independently       Able to understand and use Dyspnea scale Yes       Intervention Provide education and explanation on how to use Dyspnea scale       Expected Outcomes Short Term: Able to use Dyspnea scale daily in rehab to express subjective sense of shortness of breath during exertion;Long Term: Able to use Dyspnea scale to guide intensity level when exercising independently       Knowledge and understanding of Target Heart Rate Range (THRR) Yes       Intervention Provide education and explanation of THRR including how the numbers were predicted and where they are located for reference       Expected Outcomes Short  Term: Able to state/look up THRR;Long Term: Able to use THRR to govern intensity when exercising independently;Short Term: Able to use daily as guideline for intensity in rehab       Able to check pulse independently Yes       Intervention Provide education and demonstration on how to check pulse in carotid and radial arteries.;Review the importance of being able to check your own pulse for safety during independent exercise       Expected Outcomes Short Term: Able to explain why pulse checking is important during independent exercise;Long Term: Able to check pulse independently and accurately       Understanding of Exercise Prescription Yes       Intervention Provide education, explanation, and written materials on patient's individual exercise prescription       Expected Outcomes Short Term: Able to explain program exercise prescription;Long Term: Able to explain home exercise prescription to exercise independently                Exercise Goals Re-Evaluation :  Exercise Goals Re-Evaluation     Row Name 07/15/21 1109 07/25/21 1332 08/08/21 0854 08/12/21 1136 08/22/21 1055     Exercise Goal Re-Evaluation   Exercise Goals Review Increase Physical Activity;Able to understand and use rate of perceived exertion (RPE) scale;Knowledge and understanding of Target Heart Rate Range (THRR);Understanding of Exercise Prescription;Increase Strength and Stamina;Able to understand and use Dyspnea scale;Able to check pulse independently Increase Physical Activity;Increase Strength and Stamina;Understanding of Exercise Prescription -- Increase Physical Activity;Able to understand and use rate of perceived exertion (RPE) scale;Knowledge and understanding of Target Heart Rate Range (THRR);Understanding of Exercise Prescription;Increase Strength and Stamina;Able to check pulse independently Increase Physical Activity;Increase Strength and Stamina   Comments Reviewed RPE and dyspnea scales, THR and program  prescription with pt  today.  Pt voiced understanding and was given a copy of goals to take home. Art is doing well in rehab.  He is already up to 22 laps and level 4 on the T5 NuStep.  We will continue to monitor his progress. Art attends consistently and reaches THR range.  Staff will enourage increasing to 4 lb for strength work. Reviewed home exercise with pt today.  Pt plans to walk for exercise.  Reviewed THR, pulse, RPE, sign and symptoms, pulse oximetery and when to call 911 or MD.  Also discussed weather considerations and indoor options.  Pt voiced understanding. Rueben is doing well in rehab. He is now up to 4 lbs for resistance training. His laps on the track vary anywhere between 15-25 laps and should keep it consistent and progressively increase for better results. Will continue to monitor.   Expected Outcomes Short: Use RPE daily to regulate intensity. Long: Follow program prescription in THR. Short: Continue to attend regularly Long: Continue to follow program prescription Short - try 4 lb Long: continue to build stamina Short: add two extra days of exercise. Long: independently exercise after graduation Short: Keep laps on track consistent and increase laps as tolerated Long: Continue to increase overall MET level    Row Name 08/22/21 1127 09/05/21 1647 09/19/21 1444 10/03/21 1647       Exercise Goal Re-Evaluation   Exercise Goals Review -- Increase Physical Activity;Increase Strength and Stamina -- Increase Physical Activity;Increase Strength and Stamina    Comments Art walks around the house for now while his wife is out of town. He usually walks with her 20 minutes every other day on a trail near them. He has 5 lbs weights at home, but has not used them yet. Art does reach THR range during sessions.  Staff will encourage him to increase weigths to 5 lb. and keep working on increasing loads on machines. Out since last review This week was Arts first back since surgery.  Staff will monitor  progress.    Expected Outcomes Short: include weights at home to set up routine Long: Continue to increase overall MET level Short: increase weights to 5 lb Long: improve overall stamina -- Short: get back to consistent attedance Long:  increase MET level             Discharge Exercise Prescription (Final Exercise Prescription Changes):  Exercise Prescription Changes - 10/03/21 1600       Response to Exercise   Blood Pressure (Admit) 102/64    Blood Pressure (Exit) 110/60    Heart Rate (Admit) 118 bpm    Heart Rate (Exercise) 117 bpm    Heart Rate (Exit) 100 bpm    Rating of Perceived Exertion (Exercise) 11    Symptoms none    Duration Continue with 30 min of aerobic exercise without signs/symptoms of physical distress.    Intensity THRR unchanged      Progression   Progression Continue to progress workloads to maintain intensity without signs/symptoms of physical distress.    Average METs 3      Resistance Training   Training Prescription Yes    Weight 4 lb    Reps 10-15      Interval Training   Interval Training No      REL-XR   Level 5    Minutes 15    METs 4      Track   Laps 20    Minutes 15    METs 2.09  Home Exercise Plan   Plans to continue exercise at Home (comment)   walk   Frequency Add 2 additional days to program exercise sessions.    Initial Home Exercises Provided 08/12/21             Nutrition:  Target Goals: Understanding of nutrition guidelines, daily intake of sodium <1546m, cholesterol <2035m calories 30% from fat and 7% or less from saturated fats, daily to have 5 or more servings of fruits and vegetables.  Education: All About Nutrition: -Group instruction provided by verbal, written material, interactive activities, discussions, models, and posters to present general guidelines for heart healthy nutrition including fat, fiber, MyPlate, the role of sodium in heart healthy nutrition, utilization of the nutrition label, and  utilization of this knowledge for meal planning. Follow up email sent as well. Written material given at graduation. Flow[facility]  Date 07/27/21  Educator [place] Review Code 1- Verbalizes Understanding       Biometrics:  Pre Biometrics - 07/12/21 1017       Pre Biometrics   Height '5\' 11"'  (1.803 m)    Weight 199 lb 6.4 oz (90.4 kg)    BMI (Calculated) 27.82    Single Leg Stand 3.8 seconds              Nutrition Therapy Plan and Nutrition Goals:  Nutrition Therapy & Goals - 08/15/21 1634       Nutrition Therapy   Diet Heart healthy, low Na, diabetes friendly    Drug/Food Interactions Food/Disease   oxalate rich food and kidney stones   Protein (specify units) 70g    Fiber 30 grams    Whole Grain Foods 3 servings    Saturated Fats 12 max. grams    Fruits and Vegetables 8 servings/day    Sodium 1.5 grams      Personal Nutrition Goals   Nutrition Goal ST: include fruits and vegetables with low to moderate oxalates and limit those high in oxalates, eat another portion of food or at fat such as avocado to toast to add to satiety LT: lower risk of kidney stones while managing other health conditions, honor hunger.    Comments BG: He does not usually test it. Last A1C 7.5 August. He reports he is hungry all the time. B: oatmeal or just an egg or two with one piece of toast (whole wheat) with small amount of butter. 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 L: sandwich or sometimes soup - canned (sometimes will not eat lunch) S: fruit (apples or banana) D: it[place] normally - spinach pasta, fish or chicken and some beef, he is trying to eliminate high oxalate foods and is now realizing that spinach is high in oxalates. Discussed heart healthy eating, diabetes friendly eating and eating to lower risk of kidney stones.      Intervention Plan   Intervention Prescribe, educate and counsel regarding  individualized specific dietary modifications aiming towards targeted core components such as weight, hypertension, lipid management, diabetes, heart failure and other comorbidities.;Nutrition handout(s) given to patient.    Expected Outcomes Short Term Goal: Understand basic principles of dietary content, such as calories, fat, sodium, cholesterol and nutrients.;Short Term Goal: A plan has been developed with personal nutrition goals set during dietitian appointment.;Long Term Goal: Adherence to prescribed nutrition plan.             Nutrition Assessments:  MEDIFICTS Score Key: ?70 Need  to make dietary changes  40-70 Heart Healthy Diet ? 40 Therapeutic Level Cholesterol Diet  Flowsheet Row Cardiac Rehab from 07/12/2021 in [facility] Cardiac and Pulmonary Rehab  Picture Your Plate Total Score on Admission 78      Picture Your Plate Scores: <40 Unhealthy dietary pattern with much room for improvement. 41-50 Dietary pattern unlikely to meet recommendations for good health and room for improvement. 51-60 More healthful dietary pattern, with some room for improvement.  >60 Healthy dietary pattern, although there may be some specific behaviors that could be improved.    Nutrition Goals Re-Evaluation:  Nutrition Goals Re-Evaluation     [place] Name 07/22/21 1133             Goals   Current Weight 204 lb (92.5 kg)       Nutrition Goal Decrease red meat.       Comment He states that he eats red meat twice a week. Sometimes he eats to much and other times he doesnt eat enough. After his surgery his weight had went down to 195. He hope that his weight gain is from working out. Canned soups are something he feels like he can cut back on to help his weight because he knows they have alot of sodium. Meet with the dietitian.       Expected Outcome Short: meet with the dietitian and cut back on canned soups. Long: maintain a diet that pertains to his needs.                Nutrition Goals  Discharge (Final Nutrition Goals Re-Evaluation):  Nutrition Goals Re-Evaluation - 07/22/21 1133       Goals   Current Weight 204 lb (92.5 kg)    Nutrition Goal Decrease red meat.    Comment He states that he eats red meat twice a week. Sometimes he eats to much and other times he doesnt eat enough. After his surgery his weight had went down to 195. He hope that his weight gain is from working out. Canned soups are something he feels like he can cut back on to help his weight because he knows they have alot of sodium. Meet with the dietitian.    Expected Outcome Short: meet with the dietitian and cut back on canned soups. Long: maintain a diet that pertains to his needs.             Psychosocial: Target Goals: Acknowledge presence or absence of significant depression and/or stress, maximize coping skills, provide positive support system. Participant is able to verbalize types and ability to use techniques and skills needed for reducing stress and depression.   Education: Stress, Anxiety, and Depression - Group verbal and visual presentation to define topics covered.  Reviews how body is impacted by stress, anxiety, and depression.  Also discusses healthy ways to reduce stress and to treat/manage anxiety and depression.  Written material given at graduation. Flowsheet Row Cardiac Rehab from 09/28/2021 in [facility] Cardiac and Pulmonary Rehab  Date 08/17/21  Educator [facility]  Instruction Review Code 1- Verbalizes Understanding       Education: Sleep Hygiene -Provides group verbal and written instruction about how sleep can affect your health.  Define sleep hygiene, discuss sleep cycles and impact of sleep habits. Review good sleep hygiene tips.    Initial Review & Psychosocial Screening:  Initial Psych Review & Screening - 06/29/21 1411       Initial Review   Current issues with None Identified  Family Dynamics   Good Support System? Yes   wife   Concerns Inappropriate over/under  dependence on family/friends      Barriers   Psychosocial barriers to participate in program There are no identifiable barriers or psychosocial needs.;The patient should benefit from training in stress management and relaxation.      Screening Interventions   Interventions Encouraged to exercise;To provide support and resources with identified psychosocial needs;Provide feedback about the scores to participant    Expected Outcomes Short Term goal: Identification and review with participant of any Quality of Life or Depression concerns found by scoring the questionnaire.;Short Term goal: Utilizing psychosocial counselor, staff and physician to assist with identification of specific Stressors or current issues interfering with healing process. Setting desired goal for each stressor or current issue identified.;Long Term Goal: Stressors or current issues are controlled or eliminated.;Long Term goal: The participant improves quality of Life and PHQ9 Scores as seen by post scores and/or verbalization of changes             Quality of Life Scores:   Quality of Life - 07/12/21 1020       Quality of Life   Select Quality of Life      Quality of Life Scores   Health/Function Pre 24.8 %    Socioeconomic Pre 30 %    Psych/Spiritual Pre 30 %    Family Pre 30 %    GLOBAL Pre 27.56 %            Scores of 19 and below usually indicate a poorer quality of life in these areas.  A difference of  2-3 points is a clinically meaningful difference.  A difference of 2-3 points in the total score of the Quality of Life Index has been associated with significant improvement in overall quality of life, self-image, physical symptoms, and general health in studies assessing change in quality of life.  PHQ-9: Recent Review Flowsheet Data     Depression screen [facility] 2/9 07/12/2021   Decreased Interest 2   Down, Depressed, Hopeless 2   PHQ - 2 Score 4   Altered sleeping 1   Tired, decreased energy 3    Change in appetite 2   Feeling bad or failure about yourself  2   Trouble concentrating 1   Moving slowly or fidgety/restless 2   Suicidal thoughts 0   PHQ-9 Score 15   Difficult doing work/chores Not difficult at all      Interpretation of Total Score  Total Score Depression Severity:  1-4 = Minimal depression, 5-9 = Mild depression, 10-14 = Moderate depression, 15-19 = Moderately severe depression, 20-27 = Severe depression   Psychosocial Evaluation and Intervention:  Psychosocial Evaluation - 06/29/21 1417       Psychosocial Evaluation & Interventions   Interventions Encouraged to exercise with the program and follow exercise prescription    Comments 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 He states in the first few weeks there was some anxiety and maybe even some depression related to his surgery and recovery, but he said the last two weeks he has been feeling a lot better. His wife, who is a Marine scientist, has been very supportive and helped keep him on track. He has been walking three days a week and reports feeling well after. They are looking forward to starting cardiac rehab for education and exercise guidelines    Expected Outcomes Short: attend cardiac rehab for education and exercise. Long: develop and maintain positive  self care habits.    Continue Psychosocial Services  Follow up required by staff             Psychosocial Re-Evaluation:  Psychosocial Re-Evaluation     [place] Name 07/22/21 1139 08/22/21 1129           Psychosocial Re-Evaluation   Current issues with Current Sleep Concerns;Current Depression Current Sleep Concerns;Current Depression      Comments Art takes a sleeping pill at night to help him get to sleep. He wants to come off of it. He states that his heart procedure is giving him some depression. Informed patient that if he feels like his depression is getting worse to let us and his doctor know. He continues to take a sleeping pill, helps most of the  time - he would like to start weaning (it his habit forming) - encouraged him to work with his MD. He is more stressed now this his wife is away he reports he will better once she returns. He reports not taking any medication for depression, but feels he is doing well. His wife is a good support system for him. Art has a good sense of humor and feels that it helps with his mood. He likes to watch TV and goes on walks with his wife.      Expected Outcomes Short: Continue to exercise regularly to support mental health and notify staff of any changes. Long: maintain mental health and well being through teaching of rehab or prescribed medications independently. Short: Continue to exercise regularly to support mental health and notify staff of any changes. Long: maintain mental health and well being through teaching of rehab or prescribed medications independently.      Interventions Encouraged to attend Cardiac Rehabilitation for the exercise;Stress management education;Relaxation education Encouraged to attend Cardiac Rehabilitation for the exercise;Stress management education;Relaxation education      Continue Psychosocial Services  Follow up required by staff Follow up required by staff               Psychosocial Discharge (Final Psychosocial Re-Evaluation):  Psychosocial Re-Evaluation - 08/22/21 1129       Psychosocial Re-Evaluation   Current issues with Current Sleep Concerns;Current Depression    Comments He continues to take a sleeping pill, helps most of the time - he would like to start weaning (it his habit forming) - encouraged him to work with his MD. He is more stressed now this his wife is away he reports he will better once she returns. He reports not taking any medication for depression, but feels he is doing well. His wife is a good support system for him. Art has a good sense of humor and feels that it helps with his mood. He likes to watch TV and goes on walks with his wife.     Expected Outcomes Short: Continue to exercise regularly to support mental health and notify staff of any changes. Long: maintain mental health and well being through teaching of rehab or prescribed medications independently.    Interventions Encouraged to attend Cardiac Rehabilitation for the exercise;Stress management education;Relaxation education    Continue Psychosocial Services  Follow up required by staff             Vocational Rehabilitation: Provide vocational rehab assistance to qualifying candidates.   Vocational Rehab Evaluation & Intervention:  Vocational Rehab - 06/29/21 1407       Initial Vocational Rehab Evaluation & Intervention   Assessment shows need for Vocational Rehabilitation No  Education: Education Goals: Education classes will be provided on a variety of topics geared toward better understanding of heart health and risk factor modification. Participant will state understanding/return demonstration of topics presented as noted by education test scores.  Learning Barriers/Preferences:  Learning Barriers/Preferences - 06/29/21 1419       Learning Barriers/Preferences   Learning Barriers None    Learning Preferences None             General Cardiac Education Topics:  AED/CPR: - Group verbal and written instruction with the use of models to demonstrate the basic use of the AED with the basic ABC's of resuscitation.   Anatomy and Cardiac Procedures: - Group verbal and visual presentation and models provide information about basic cardiac anatomy and function. Reviews the testing methods done to diagnose heart disease and the outcomes of the test results. Describes the treatment choices: Medical Management, Angioplasty, or Coronary Bypass Surgery for treating various heart conditions including Myocardial Infarction, Angina, Valve Disease, and Cardiac Arrhythmias.  Written material given at graduation.   Medication Safety: - Group  verbal and visual instruction to review commonly prescribed medications for heart and lung disease. Reviews the medication, class of the drug, and side effects. Includes the steps to properly store meds and maintain the prescription regimen.  Written material given at graduation. Flowsheet Row Cardiac Rehab from 09/28/2021 in [facility] Cardiac and Pulmonary Rehab  Date 09/28/21  Educator [facility]  Instruction Review Code 1- Verbalizes Understanding       Intimacy: - Group verbal instruction through game format to discuss how heart and lung disease can affect sexual intimacy. Written material given at graduation.. Flowsheet Row Cardiac Rehab from 09/28/2021 in [facility] Cardiac and Pulmonary Rehab  Date 08/31/21  Educator [facility]  Instruction Review Code 1- Verbalizes Understanding       Know Your Numbers and Heart Failure: - Group verbal and visual instruction to discuss disease risk factors for cardiac and pulmonary disease and treatment options.  Reviews associated critical values for Overweight/Obesity, Hypertension, Cholesterol, and Diabetes.  Discusses basics of heart failure: signs/symptoms and treatments.  Introduces Heart Failure Zone chart for action plan for heart failure.  Written material given at graduation. Flowsheet Row Cardiac Rehab from 09/28/2021 in [facility] Cardiac and Pulmonary Rehab  Education need identified 07/12/21  Date 08/03/21  Educator [facility]  Instruction Review Code 1- Verbalizes Understanding       Infection Prevention: - Provides verbal and written material to individual with discussion of infection control including proper hand washing and proper equipment cleaning during exercise session. Flowsheet Row Cardiac Rehab from 09/28/2021 in [facility] Cardiac and Pulmonary Rehab  Date 07/12/21  Educator [facility]  Instruction Review Code 1- Verbalizes Understanding       Falls Prevention: - Provides verbal and written material to individual with discussion of falls prevention and  safety. Flowsheet Row Cardiac Rehab from 09/28/2021 in [facility] Cardiac and Pulmonary Rehab  Date 07/12/21  Educator [facility]  Instruction Review Code 1- Verbalizes Understanding       Other: -Provides group and verbal instruction on various topics (see comments)   Knowledge Questionnaire Score:  Knowledge Questionnaire Score - 07/12/21 1020       Knowledge Questionnaire Score   Pre Score 21/26             Core Components/Risk Factors/Patient Goals at Admission:  Personal Goals and Risk Factors at Admission - 07/12/21 1021       Core Components/Risk Factors/Patient Goals on Admission    Weight  Management Yes;Weight Loss    Intervention Weight Management: Develop a combined nutrition and exercise program designed to reach desired caloric intake, while maintaining appropriate intake of nutrient and fiber, sodium and fats, and appropriate energy expenditure required for the weight goal.;Weight Management: Provide education and appropriate resources to help participant work on and attain dietary goals.;Weight Management/Obesity: Establish reasonable short term and long term weight goals.    Admit Weight 199 lb 6.4 oz (90.4 kg)    Goal Weight: Short Term 194 lb (88 kg)    Goal Weight: Long Term 190 lb (86.2 kg)    Expected Outcomes Short Term: Continue to assess and modify interventions until short term weight is achieved;Long Term: Adherence to nutrition and physical activity/exercise program aimed toward attainment of established weight goal;Weight Loss: Understanding of general recommendations for a balanced deficit meal plan, which promotes 1-2 lb weight loss per week and includes a negative energy balance of [phone] kcal/d;Understanding recommendations for meals to include 15-35% energy as protein, 25-35% energy from fat, 35-60% energy from carbohydrates, less than 255m of dietary cholesterol, 20-35 gm of total fiber daily;Understanding of distribution of calorie intake throughout the day  with the consumption of 4-5 meals/snacks    Heart Failure Yes    Intervention Provide a combined exercise and nutrition program that is supplemented with education, support and counseling about heart failure. Directed toward relieving symptoms such as shortness of breath, decreased exercise tolerance, and extremity edema.    Expected Outcomes Improve functional capacity of life;Short term: Attendance in program 2-3 days a week with increased exercise capacity. Reported lower sodium intake. Reported increased fruit and vegetable intake. Reports medication compliance.;Short term: Daily weights obtained and reported for increase. Utilizing diuretic protocols set by physician.;Long term: Adoption of self-care skills and reduction of barriers for early signs and symptoms recognition and intervention leading to self-care maintenance.    Hypertension Yes    Intervention Provide education on lifestyle modifcations including regular physical activity/exercise, weight management, moderate sodium restriction and increased consumption of fresh fruit, vegetables, and low fat dairy, alcohol moderation, and smoking cessation.;Monitor prescription use compliance.    Expected Outcomes Short Term: Continued assessment and intervention until BP is < 140/948mHG in hypertensive participants. < 130/8044mG in hypertensive participants with diabetes, heart failure or chronic kidney disease.;Long Term: Maintenance of blood pressure at goal levels.    Lipids Yes    Intervention Provide education and support for participant on nutrition & aerobic/resistive exercise along with prescribed medications to achieve LDL <67m14mDL >40mg43m Expected Outcomes Short Term: Participant states understanding of desired cholesterol values and is compliant with medications prescribed. Participant is following exercise prescription and nutrition guidelines.;Long Term: Cholesterol controlled with medications as prescribed, with individualized  exercise RX and with personalized nutrition plan. Value goals: LDL < 67mg,23m > 40 mg.             Education:Diabetes - Individual verbal and written instruction to review signs/symptoms of diabetes, desired ranges of glucose level fasting, after meals and with exercise. Acknowledge that pre and post exercise glucose checks will be done for 3 sessions at entry of program. [facility]  Date 06/29/21  [facility]ator [facility]- Verbalizes Understanding       Core Components/Risk Factors/Patient Goals Review:   Goals and Risk Factor Review     Row Name 07/22/21 1126 08/22/21 1115  Core Components/Risk Factors/Patient Goals Review   Personal Goals Review Hypertension;Diabetes Diabetes;Hypertension      Review Art has been checking his blood pressure at home ecery other day and his hreading are doing well. He states he is 100-110s over 60s on average. Art has spoke to his doctor about his diabetes and is checking his A1C every three months. He is watching what he eats but he states he is hungy alot. His provider states that his A1C should be below 8. He takes metforamin in the morning and at night. He goes by how he feels and knows how his sugars are stable. Art and his wife both do not check their BG and they fo by A1C readings (last one 7.5, due for his next one). Art has been trying to eat in a way that prevents kidney stones as well as supports his diabetes and heart - working with RD. He is still checking his BP every other day at home - 110-20/60-70. He reports taking his medication as prescribed and is having no issues.      Expected Outcomes Short: maintain blood pressure reading in class and watch diet for his diabetes. Long: keep brining down A1C. Short: continue monitoring risk factors Long: keep brining down A1C.               Core Components/Risk Factors/Patient Goals at Discharge  (Final Review):   Goals and Risk Factor Review - 08/22/21 1115       Core Components/Risk Factors/Patient Goals Review   Personal Goals Review Diabetes;Hypertension    Review Art and his wife both do not check their BG and they fo by A1C readings (last one 7.5, due for his next one). Art has been trying to eat in a way that prevents kidney stones as well as supports his diabetes and heart - working with RD. He is still checking his BP every other day at home - 110-20/60-70. He reports taking his medication as prescribed and is having no issues.    Expected Outcomes Short: continue monitoring risk factors Long: keep brining down A1C.             ITP Comments:  ITP Comments     Row Name 06/29/21 1437 07/12/21 1013 07/13/21 0647 07/15/21 1108 08/10/21 0946   ITP Comments Initial telephone orientation completed. Diagnosis can be found in [facility] 7/9. EP orientation scheduled Tuesday 9/6 at 8am. Completed 6MWT and gym orientation. Initial ITP created and sent for review to Dr. Emily Filbert, Medical Director. 30 Day review completed. Medical Director ITP review done, changes made as directed, and signed approval by Medical Director. First full day of exercise!  Patient was oriented to gym and equipment including functions, settings, policies, and procedures.  Patient's individual exercise prescription and treatment plan were reviewed.  All starting workloads were established based on the results of the 6 minute walk test done at initial orientation visit.  The plan for exercise progression was also introduced and progression will be customized based on patient's performance and goals. 30 day review completed. ITP sent to Dr. Emily Filbert, Medical Director of Cardiac Rehab. Continue with ITP unless changes are made by physician.    [place] Name 08/15/21 1719 09/07/21 0747 09/19/21 1442 10/04/21 0745 10/05/21 0728   ITP Comments Completed initial RD evaluation 30 Day review completed. Medical Director ITP review  done, changes made as directed, and signed approval by Medical Director. Art is currently out on hold with a DVT.  He will  need clearance to return. Art only attended rehab 2 days during 30-day review cycle due to DVT - unable to review goals this cycle. 30 Day review completed. Medical Director ITP review done, changes made as directed, and signed approval by Medical Director.            Comments:

## 2021-10-05 NOTE — Progress Notes (Signed)
Daily Session Note  Patient Details  Name: Ralph Marsh MRN: [id number] Date of Birth: Oct 24, 1946 Referring Provider:   Flowsheet Row Cardiac Rehab from 07/12/2021 in [facility] Cardiac and Pulmonary Rehab  Referring Provider Brett Canales MD ([place])       Encounter Date: 10/05/2021  Check In:  Session Check In - 10/05/21 1024       Check-In   Supervising physician immediately available to respond to emergencies See telemetry face sheet for immediately available ER MD    Location [facility]-Cardiac & Pulmonary Rehab    Staff Present Birdie Sons, MPA, Elveria Rising, BA, ACSM CEP, Exercise Physiologist;Joseph Tessie Fass, [place]    Virtual Visit No    Medication changes reported     No    Fall or balance concerns reported    No    Tobacco Cessation No Change    Warm-up and Cool-down Performed on first and last piece of equipment    Resistance Training Performed Yes    VAD Patient? No    PAD/SET Patient? No      Pain Assessment   Currently in Pain? No/denies                Social History   Tobacco Use  Smoking Status Former   Packs/day: 1.00   Years: 15.00   Pack years: 15.00   Types: Cigarettes   Quit date: 1995   Years since quitting: 27.9  Smokeless Tobacco Never    Goals Met:  Independence with exercise equipment Exercise tolerated well No report of concerns or symptoms today Strength training completed today  Goals Unmet:  Not Applicable  Comments: Pt able to follow exercise prescription today without complaint.  Will continue to monitor for progression.    Dr. Emily Filbert is Medical Director for [place].  Dr. Ottie Glazier is Medical Director for [facility] Pulmonary Rehabilitation.

## 2021-10-07 ENCOUNTER — Other Ambulatory Visit: Payer: Self-pay

## 2021-10-07 ENCOUNTER — Encounter: Payer: No Typology Code available for payment source | Attending: Family Medicine | Admitting: *Deleted

## 2021-10-07 ENCOUNTER — Other Ambulatory Visit (INDEPENDENT_AMBULATORY_CARE_PROVIDER_SITE_OTHER): Payer: Self-pay | Admitting: Nurse Practitioner

## 2021-10-07 VITALS — Ht 71.9 in | Wt 200.0 lb

## 2021-10-07 DIAGNOSIS — I2510 Atherosclerotic heart disease of native coronary artery without angina pectoris: Secondary | ICD-10-CM | POA: Insufficient documentation

## 2021-10-07 DIAGNOSIS — Z48812 Encounter for surgical aftercare following surgery on the circulatory system: Secondary | ICD-10-CM | POA: Diagnosis not present

## 2021-10-07 DIAGNOSIS — Z951 Presence of aortocoronary bypass graft: Secondary | ICD-10-CM | POA: Insufficient documentation

## 2021-10-07 NOTE — Telephone Encounter (Signed)
Please advise 

## 2021-10-07 NOTE — Telephone Encounter (Signed)
This patient has eliquis, is that what they are referring to?

## 2021-10-07 NOTE — Progress Notes (Signed)
Daily Session Note  Patient Details  Name: Ralph Marsh MRN: [id number] Date of Birth: 1946-06-03 Referring Provider:   Flowsheet Row Cardiac Rehab from 07/12/2021 in [facility] Cardiac and Pulmonary Rehab  Referring Provider Brett Canales MD ([place])       Encounter Date: 10/07/2021  Check In:  Session Check In - 10/07/21 1117       Check-In   Supervising physician immediately available to respond to emergencies See telemetry face sheet for immediately available ER MD    Location [facility]-Cardiac & Pulmonary Rehab    Staff Present Heath Lark, RN, BSN, CCRP;Joseph [place], RCP,RRT,BSRT;Jessica Maynard, [place], [place], CCRP, CCET    Virtual Visit No    Medication changes reported     No    Fall or balance concerns reported    No    Warm-up and Cool-down Performed on first and last piece of equipment    Resistance Training Performed Yes    VAD Patient? No    PAD/SET Patient? No      Pain Assessment   Currently in Pain? No/denies                Social History   Tobacco Use  Smoking Status Former   Packs/day: 1.00   Years: 15.00   Pack years: 15.00   Types: Cigarettes   Quit date: 1995   Years since quitting: 27.9  Smokeless Tobacco Never    Goals Met:  Independence with exercise equipment Exercise tolerated well No report of concerns or symptoms today  Goals Unmet:  Not Applicable  Comments: Pt able to follow exercise prescription today without complaint.  Will continue to monitor for progression.    Dr. Emily Filbert is Medical Director for [place].  Dr. Ottie Glazier is Medical Director for [facility] Pulmonary Rehabilitation.

## 2021-10-07 NOTE — Patient Instructions (Signed)
Discharge Patient Instructions  Patient Details  Name: Ralph Marsh MRN: [id number] Date of Birth: 11-Jun-1946 Referring Provider:  Georgiana Shore., P*   Number of Visits: 36  Reason for Discharge:  Patient reached a stable level of exercise. Patient independent in their exercise. Patient has met program and personal goals.  Smoking History:  Social History   Tobacco Use  Smoking Status Former   Packs/day: 1.00   Years: 15.00   Pack years: 15.00   Types: Cigarettes   Quit date: 1995   Years since quitting: 27.9  Smokeless Tobacco Never    Diagnosis:  S/P CABG x 2  Initial Exercise Prescription:  Initial Exercise Prescription - 07/12/21 1000       Date of Initial Exercise RX and Referring Provider   Date 07/12/21    Referring Provider Brett Canales [facility] ([facility])      Treadmill   MPH 1.5    Grade 0.5    Minutes 15    METs 2.26      REL-XR   Level 1    Speed 50    Minutes 15    METs 1.5      T5 Nustep   Level 2    SPM 80    Minutes 15    METs 2      Track   Laps 23    Minutes 15    METs 2.25      Prescription Details   Frequency (times per week) 3    Duration Progress to 30 minutes of continuous aerobic without signs/symptoms of physical distress      Intensity   THRR 40-80% of Max Heartrate 108-131    Ratings of Perceived Exertion 11-13    Perceived Dyspnea 0-4      Progression   Progression Continue to progress workloads to maintain intensity without signs/symptoms of physical distress.      Resistance Training   Training Prescription Yes    Weight 3 lb    Reps 10-15             Discharge Exercise Prescription (Final Exercise Prescription Changes):  Exercise Prescription Changes - 10/03/21 1600       Response to Exercise   Blood Pressure (Admit) 102/64    Blood Pressure (Exit) 110/60    Heart Rate (Admit) 118 bpm    Heart Rate (Exercise) 117 bpm    Heart Rate (Exit) 100 bpm    Rating of Perceived Exertion (Exercise) 11     Symptoms none    Duration Continue with 30 min of aerobic exercise without signs/symptoms of physical distress.    Intensity THRR unchanged      Progression   Progression Continue to progress workloads to maintain intensity without signs/symptoms of physical distress.    Average METs 3      Resistance Training   Training Prescription Yes    Weight 4 lb    Reps 10-15      Interval Training   Interval Training No      REL-XR   Level 5    Minutes 15    METs 4      Track   Laps 20    Minutes 15    METs 2.09      Home Exercise Plan   Plans to continue exercise at Home (comment)   walk   Frequency Add 2 additional days to program exercise sessions.    Initial Home Exercises Provided 08/12/21  Functional Capacity:  6 Minute Walk     Row Name 07/12/21 1013 10/07/21 1112       6 Minute Walk   Phase Initial Discharge    Distance 890 feet 1060 feet    Distance % Change -- 19.1 %    Distance Feet Change -- 170 ft    Walk Time 6 minutes 6 minutes    # of Rest Breaks 0 0    MPH 1.67 2    METS 2.09 2.5    RPE 11 11    Perceived Dyspnea  1 --    VO2 Peak 7.32 8.76    Symptoms Yes (comment) No    Comments leg pain from swelling (2/10), full inflation feels tight in chest leg/knee pain 2/10    Resting HR 84 bpm 86 bpm    Resting BP 132/66 118/64    Resting Oxygen Saturation  94 % 93 %    Exercise Oxygen Saturation  during 6 min walk 96 % 94 %    Max Ex. HR 115 bpm 138 bpm    Max Ex. BP 136/74 126/64    2 Minute Post BP 112/64 --               Nutrition & Weight - Outcomes:  Pre Biometrics - 07/12/21 1017       Pre Biometrics   Height '5\' 11"'  (1.803 m)    Weight 199 lb 6.4 oz (90.4 kg)    BMI (Calculated) 27.82    Single Leg Stand 3.8 seconds             Post Biometrics - 10/07/21 1113        Post  Biometrics   Height 5' 11.9" (1.826 m)    Weight 200 lb (90.7 kg)    BMI (Calculated) 27.21    Single Leg Stand 4.4 seconds              Nutrition:  Nutrition Therapy & Goals - 08/15/21 1634       Nutrition Therapy   Diet Heart healthy, low Na, diabetes friendly    Drug/Food Interactions Food/Disease   oxalate rich food and kidney stones   Protein (specify units) 70g    Fiber 30 grams    Whole Grain Foods 3 servings    Saturated Fats 12 max. grams    Fruits and Vegetables 8 servings/day    Sodium 1.5 grams      Personal Nutrition Goals   Nutrition Goal ST: include fruits and vegetables with low to moderate oxalates and limit those high in oxalates, eat another portion of food or at fat such as avocado to toast to add to satiety LT: lower risk of kidney stones while managing other health conditions, honor hunger.    Comments BG: He does not usually test it. Last A1C 7.5 August. He reports he is hungry all the time. B: oatmeal or just an egg or two with one piece of toast (whole wheat) with small amount of butter. 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 L: sandwich or sometimes soup - canned (sometimes will not eat lunch) S: fruit (apples or banana) D: [place] food normally - spinach pasta, fish or chicken and some beef, he is trying to eliminate high oxalate foods and is now realizing that spinach is high in oxalates. Discussed heart healthy eating, diabetes friendly eating and eating to lower risk of kidney stones.      Intervention Plan   Intervention Prescribe, educate  and counsel regarding individualized specific dietary modifications aiming towards targeted core components such as weight, hypertension, lipid management, diabetes, heart failure and other comorbidities.;Nutrition handout(s) given to patient.    Expected Outcomes Short Term Goal: Understand basic principles of dietary content, such as calories, fat, sodium, cholesterol and nutrients.;Short Term Goal: A plan has been developed with personal nutrition goals set during dietitian appointment.;Long Term Goal: Adherence to prescribed nutrition plan.             Goals reviewed with patient; copy given to patient.

## 2021-10-10 ENCOUNTER — Encounter: Payer: Self-pay | Admitting: *Deleted

## 2021-10-10 ENCOUNTER — Telehealth: Payer: Self-pay | Admitting: *Deleted

## 2021-10-10 ENCOUNTER — Other Ambulatory Visit (INDEPENDENT_AMBULATORY_CARE_PROVIDER_SITE_OTHER): Payer: Self-pay | Admitting: Nurse Practitioner

## 2021-10-10 DIAGNOSIS — Z951 Presence of aortocoronary bypass graft: Secondary | ICD-10-CM

## 2021-10-10 MED ORDER — APIXABAN 5 MG PO TABS
ORAL_TABLET | ORAL | 0 refills | Status: AC
Start: 2021-10-10 — End: ?

## 2021-10-10 NOTE — Telephone Encounter (Signed)
sent 

## 2021-10-10 NOTE — Progress Notes (Signed)
Cardiac Individual Treatment Plan  Patient Details  Name: Ralph Marsh MRN: [id number] Date of Birth: Aug 02, 1946 Referring Provider:   Flowsheet Row Cardiac Rehab from 07/12/2021 in [facility] Cardiac and Pulmonary Rehab  Referring Provider Brett Canales MD ([place])       Initial Encounter Date:  Flowsheet Row Cardiac Rehab from 07/12/2021 in [facility] Cardiac and Pulmonary Rehab  Date 07/12/21       Visit Diagnosis: S/P CABG x 2  Patient's Home Medications on Admission:  Current Outpatient Medications:    allopurinol (ZYLOPRIM) 300 MG tablet, Take 1 tablet by mouth daily., Disp: , Rfl:    apixaban (ELIQUIS) 5 MG TABS tablet, Two tabs (79m) po twice a day for one week then 1 tab (525m po twice a day, Disp: 74 tablet, Rfl: 0   aspirin EC 81 MG tablet, Take 81 mg by mouth daily., Disp: , Rfl:    Cholecalciferol 1000 UNITS tablet, Take 1,000 Units by mouth 2 (two) times daily., Disp: , Rfl:    esomeprazole (NEXIUM) 40 MG capsule, Take 1 capsule by mouth daily., Disp: , Rfl:    Eszopiclone 3 MG TABS, Take 3 mg by mouth at bedtime as needed (sleep)., Disp: , Rfl:    furosemide (LASIX) 20 MG tablet, Take 40 mg by mouth daily at 12 noon., Disp: , Rfl:    metoprolol tartrate (LOPRESSOR) 25 MG tablet, Take 0.5 tablets (12.5 mg total) by mouth 2 (two) times daily. 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[place]   [place]   left upper leg   Emphysema of lung (HCC)    mild - on xray   Fatty liver    GERD (gastroesophageal reflux disease)    Hypertension    Kidney stone     Tobacco Use: Social History    Tobacco Use  Smoking Status Former   Packs/day: 1.00   Years: 15.00   Pack years: 15.00   Types: Cigarettes   Quit date: 1995   Years since quitting: 27.9  Smokeless Tobacco Never    Labs: Recent Review [employer], physiological   Labs for ITP Cardiac and Pulmonary Rehab Latest Ref Rng & Units 05/15/2021 09/13/2021   Cholestrol 0 - 200 mg/dL 141 -   LDLCALC 0 - 99 mg/dL 68 -   HDL >40 mg/dL 34(L) -   Trlycerides <150 mg/dL 196(H) -   Hemoglobin A1c 4.8 - 5.6 % 8.2(H) 6.7(H)        Exercise Target Goals: Exercise Program Goal: Individual exercise prescription set using results from initial 6 min walk test and THRR while considering  patient's activity barriers and safety.   Exercise Prescription Goal: Initial exercise prescription builds to 30-45 minutes a day of aerobic activity, 2-3 days per week.  Home exercise guidelines will be given to patient during program as part of exercise prescription that the participant will acknowledge.   Education: Aerobic Exercise: - Group verbal and visual presentation on the components of exercise prescription. Introduces F.I.T.T principle from ACSM for exercise prescriptions.  Reviews F.I.T.T. principles of  aerobic exercise including progression. Written material given at graduation. Flowsheet Row Cardiac Rehab from 10/05/2021 in [facility] Cardiac and Pulmonary Rehab  Education need identified 07/12/21  Date 08/31/21  Educator [facility]  Instruction Review Code 1- Verbalizes Understanding       Education: Resistance Exercise: - Group verbal and visual presentation on the components of exercise prescription. Introduces F.I.T.T principle from ACSM for exercise prescriptions  Reviews F.I.T.T. principles of resistance exercise including progression. Written material given at graduation. Flowsheet Row Cardiac Rehab from 10/05/2021 in [facility] Cardiac and Pulmonary Rehab  Education need identified 07/12/21        Education: Exercise & Equipment Safety: -  Individual verbal instruction and demonstration of equipment use and safety with use of the equipment. Flowsheet Row Cardiac Rehab from 10/05/2021 in [facility] Cardiac and Pulmonary Rehab  Date 07/12/21  Educator [facility]  Instruction Review Code 1- Verbalizes Understanding       Education: Exercise Physiology & General Exercise Guidelines: - Group verbal and written instruction with models to review the exercise physiology of the cardiovascular system and associated critical values. Provides general exercise guidelines with specific guidelines to those with heart or lung disease.  Flowsheet Row Cardiac Rehab from 10/05/2021 in [facility] Cardiac and Pulmonary Rehab  Education need identified 07/12/21  Date 08/24/21  Educator [facility]  Instruction Review Code 1- [employer] Understanding       Education: Flexibility, Balance, Mind/Body Relaxation: - Group verbal and visual presentation with interactive activity on the components of exercise prescription. Introduces F.I.T.T principle from ACSM for exercise prescriptions. Reviews F.I.T.T. principles of flexibility and balance exercise training including progression. Also discusses the mind body connection.  Reviews various relaxation techniques to help reduce and manage stress (i.e. Deep breathing, progressive muscle relaxation, and visualization). Balance handout provided to take home. Written material given at graduation.   Activity Barriers & Risk Stratification:  Activity Barriers & Cardiac Risk Stratification - 07/12/21 1014       Activity Barriers & Cardiac Risk Stratification   Activity Barriers Incisional Pain;Joint Problems;Deconditioning;Muscular Weakness;Shortness of Breath;Decreased Ventricular Function;Balance Concerns;Other (comment)    Comments Swelling/edema in R leg from vein harvest    Cardiac Risk Stratification High             6 Minute Walk:  [place] Name 07/12/21 1013 10/07/21 1112       6 Minute Walk   Phase  Initial Discharge    Distance 890 feet 1060 feet    Distance % Change -- 19.1 %    Distance Feet Change -- 170 ft    Walk Time 6 minutes 6 minutes    # of Rest Breaks 0 0    MPH 1.67 2    METS 2.09 2.5    RPE 11 11    Perceived Dyspnea  1 --    VO2 Peak 7.32 8.76    Symptoms Yes (comment) No    Comments leg pain from swelling (2/10), full inflation feels tight in chest leg/knee pain 2/10    Resting HR 84 bpm 86 bpm    Resting BP 132/66 118/64    Resting Oxygen Saturation  94 % 93 %    Exercise Oxygen Saturation  during 6 min walk 96 % 94 %    Max Ex. HR 115 bpm 138 bpm    Max Ex. BP 136/74 126/64    2 Minute Post BP 112/64 --  Oxygen Initial Assessment:   Oxygen Re-Evaluation:   Oxygen Discharge (Final Oxygen Re-Evaluation):   Initial Exercise Prescription:  Initial Exercise Prescription - 07/12/21 1000       Date of Initial Exercise RX and Referring Provider   Date 07/12/21    Referring Provider Brett Canales [place] ([place])      Treadmill   MPH 1.5    Grade 0.5    Minutes 15    METs 2.26      REL-XR   Level 1    Speed 50    Minutes 15    METs 1.5      T5 Nustep   Level 2    SPM 80    Minutes 15    METs 2      Track   Laps 23    Minutes 15    METs 2.25      Prescription Details   Frequency (times per week) 3    Duration Progress to 30 minutes of continuous aerobic without signs/symptoms of physical distress      Intensity   THRR 40-80% of Max Heartrate 108-131    Ratings of Perceived Exertion 11-13    Perceived Dyspnea 0-4      Progression   Progression Continue to progress workloads to maintain intensity without signs/symptoms of physical distress.      Resistance Training   Training Prescription Yes    Weight 3 lb    Reps 10-15             Perform Capillary Blood Glucose checks as needed.  Exercise Prescription Changes:   Exercise Prescription Changes     Row Name 07/12/21 1000 07/25/21 1300 08/08/21 0800 08/12/21  1100 08/22/21 1000     Response to Exercise   Blood Pressure (Admit) 132/66 120/62 108/60 -- 98/58   Blood Pressure (Exercise) 136/74 136/64 118/60 -- --   Blood Pressure (Exit) 112/64 108/62 104/64 -- 122/62   Heart Rate (Admit) 84 bpm 81 bpm 103 bpm -- 78 bpm   Heart Rate (Exercise) 115 bpm 114 bpm 113 bpm -- 114 bpm   Heart Rate (Exit) 91 bpm 90 bpm 111 bpm -- 84 bpm   Oxygen Saturation (Admit) 94 % -- -- -- --   Oxygen Saturation (Exercise) 96 % -- -- -- --   Rating of Perceived Exertion (Exercise) '11 15 15 ' -- 12   Perceived Dyspnea (Exercise) 1 -- -- -- --   Symptoms leg pain 2/10, SOB (tight breathing) none none -- none   Comments walk test results -- -- -- --   Duration -- Continue with 30 min of aerobic exercise without signs/symptoms of physical distress. Continue with 30 min of aerobic exercise without signs/symptoms of physical distress. -- Continue with 30 min of aerobic exercise without signs/symptoms of physical distress.   Intensity -- THRR unchanged THRR unchanged -- THRR unchanged     Progression   Progression -- Continue to progress workloads to maintain intensity without signs/symptoms of physical distress. Continue to progress workloads to maintain intensity without signs/symptoms of physical distress. -- Continue to progress workloads to maintain intensity without signs/symptoms of physical distress.   Average METs -- 2.2 2.25 -- 2.43     Resistance Training   Training Prescription -- Yes Yes -- Yes   Weight -- 3 lb 3 lb -- 4 lb   Reps -- 10-15 10-15 -- 10-15     Interval Training   Interval Training -- No No -- No  REL-XR   Level -- 4 4 -- 7   Minutes -- 15 15 -- 15   METs -- 2.4 -- -- --     T5 Nustep   Level -- 4 -- -- 4   Minutes -- 15 -- -- 15   METs -- 2 -- -- 2.4     Track   Laps -- 22 21 -- 25   Minutes -- 15 15 -- 15   METs -- 2.2 2.14 -- 2.36     Home Exercise Plan   Plans to continue exercise at -- -- -- Home (comment)  walk Home  (comment)  walk   Frequency -- -- -- Add 2 additional days to program exercise sessions. Add 2 additional days to program exercise sessions.   Initial Home Exercises Provided -- -- -- 08/12/21 08/12/21    Row Name 09/05/21 1600 10/03/21 1600           Response to Exercise   Blood Pressure (Admit) 122/62 102/64      Blood Pressure (Exit) 112/68 110/60      Heart Rate (Admit) 71 bpm 118 bpm      Heart Rate (Exercise) 116 bpm 117 bpm      Heart Rate (Exit) 80 bpm 100 bpm      Rating of Perceived Exertion (Exercise) 11 11      Symptoms none none      Duration Continue with 30 min of aerobic exercise without signs/symptoms of physical distress. Continue with 30 min of aerobic exercise without signs/symptoms of physical distress.      Intensity THRR unchanged THRR unchanged        Progression   Progression Continue to progress workloads to maintain intensity without signs/symptoms of physical distress. Continue to progress workloads to maintain intensity without signs/symptoms of physical distress.      Average METs 2.6 3        Resistance Training   Training Prescription Yes Yes      Weight 4 lb 4 lb      Reps 10-15 10-15        Interval Training   Interval Training No No        REL-XR   Level 5 5      Minutes 15 15      METs -- 4        Track   Laps 15 20      Minutes 15 15      METs 1.82 2.09        Home Exercise Plan   Plans to continue exercise at Home (comment)  walk Home (comment)  walk      Frequency Add 2 additional days to program exercise sessions. Add 2 additional days to program exercise sessions.      Initial Home Exercises Provided 08/12/21 08/12/21               Exercise Comments:   Exercise Goals and Review:   Exercise Goals     Row Name 07/12/21 1017             Exercise Goals   Increase Physical Activity Yes       Intervention Provide advice, education, support and counseling about physical activity/exercise needs.;Develop an  individualized exercise prescription for aerobic and resistive training based on initial evaluation findings, risk stratification, comorbidities and participant's personal goals.       Expected Outcomes Short Term: Attend rehab on a regular basis to increase amount of physical  activity.;Long Term: Add in home exercise to make exercise part of routine and to increase amount of physical activity.;Long Term: Exercising regularly at least 3-5 days a week.       Increase Strength and Stamina Yes       Intervention Provide advice, education, support and counseling about physical activity/exercise needs.;Develop an individualized exercise prescription for aerobic and resistive training based on initial evaluation findings, risk stratification, comorbidities and participant's personal goals.       Expected Outcomes Short Term: Perform resistance training exercises routinely during rehab and add in resistance training at home;Short Term: Increase workloads from initial exercise prescription for resistance, speed, and METs.;Long Term: Improve cardiorespiratory fitness, muscular endurance and strength as measured by increased METs and functional capacity (6MWT)       Able to understand and use rate of perceived exertion (RPE) scale Yes       Intervention Provide education and explanation on how to use RPE scale       Expected Outcomes Short Term: Able to use RPE daily in rehab to express subjective intensity level;Long Term:  Able to use RPE to guide intensity level when exercising independently       Able to understand and use Dyspnea scale Yes       Intervention Provide education and explanation on how to use Dyspnea scale       Expected Outcomes Short Term: Able to use Dyspnea scale daily in rehab to express subjective sense of shortness of breath during exertion;Long Term: Able to use Dyspnea scale to guide intensity level when exercising independently       Knowledge and understanding of Target Heart Rate Range  (THRR) Yes       Intervention Provide education and explanation of THRR including how the numbers were predicted and where they are located for reference       Expected Outcomes Short Term: Able to state/look up THRR;Long Term: Able to use THRR to govern intensity when exercising independently;Short Term: Able to use daily as guideline for intensity in rehab       Able to check pulse independently Yes       Intervention Provide education and demonstration on how to check pulse in carotid and radial arteries.;Review the importance of being able to check your own pulse for safety during independent exercise       Expected Outcomes Short Term: Able to explain why pulse checking is important during independent exercise;Long Term: Able to check pulse independently and accurately       Understanding of Exercise Prescription Yes       Intervention Provide education, explanation, and written materials on patient's individual exercise prescription       Expected Outcomes Short Term: Able to explain program exercise prescription;Long Term: Able to explain home exercise prescription to exercise independently                Exercise Goals Re-Evaluation :  Exercise Goals Re-Evaluation     Row Name 07/15/21 1109 07/25/21 1332 08/08/21 0854 08/12/21 1136 08/22/21 1055     Exercise Goal Re-Evaluation   Exercise Goals Review Increase Physical Activity;Able to understand and use rate of perceived exertion (RPE) scale;Knowledge and understanding of Target Heart Rate Range (THRR);Understanding of Exercise Prescription;Increase Strength and Stamina;Able to understand and use Dyspnea scale;Able to check pulse independently Increase Physical Activity;Increase Strength and Stamina;Understanding of Exercise Prescription -- Increase Physical Activity;Able to understand and use rate of perceived exertion (RPE) scale;Knowledge and understanding of Target Heart  Rate Range (THRR);Understanding of Exercise  Prescription;Increase Strength and Stamina;Able to check pulse independently Increase Physical Activity;Increase Strength and Stamina   Comments Reviewed RPE and dyspnea scales, THR and program prescription with pt today.  Pt voiced understanding and was given a copy of goals to take home. Art is doing well in rehab.  He is already up to 22 laps and level 4 on the T5 NuStep.  We will continue to monitor his progress. Art attends consistently and reaches THR range.  Staff will enourage increasing to 4 lb for strength work. Reviewed home exercise with pt today.  Pt plans to walk for exercise.  Reviewed THR, pulse, RPE, sign and symptoms, pulse oximetery and when to call 911 or MD.  Also discussed weather considerations and indoor options.  Pt voiced understanding. Vidyuth is doing well in rehab. He is now up to 4 lbs for resistance training. His laps on the track vary anywhere between 15-25 laps and should keep it consistent and progressively increase for better results. Will continue to monitor.   Expected Outcomes Short: Use RPE daily to regulate intensity. Long: Follow program prescription in THR. Short: Continue to attend regularly Long: Continue to follow program prescription Short - try 4 lb Long: continue to build stamina Short: add two extra days of exercise. Long: independently exercise after graduation Short: Keep laps on track consistent and increase laps as tolerated Long: Continue to increase overall MET level    Row Name 08/22/21 1127 09/05/21 1647 09/19/21 1444 10/03/21 1647       Exercise Goal Re-Evaluation   Exercise Goals Review -- Increase Physical Activity;Increase Strength and Stamina -- Increase Physical Activity;Increase Strength and Stamina    Comments Art walks around the house for now while his wife is out of town. He usually walks with her 20 minutes every other day on a trail near them. He has 5 lbs weights at home, but has not used them yet. Art does reach THR range during  sessions.  Staff will encourage him to increase weigths to 5 lb. and keep working on increasing loads on machines. Out since last review This week was Arts first back since surgery.  Staff will monitor progress.    Expected Outcomes Short: include weights at home to set up routine Long: Continue to increase overall MET level Short: increase weights to 5 lb Long: improve overall stamina -- Short: get back to consistent attedance Long:  increase MET level             Discharge Exercise Prescription (Final Exercise Prescription Changes):  Exercise Prescription Changes - 10/03/21 1600       Response to Exercise   Blood Pressure (Admit) 102/64    Blood Pressure (Exit) 110/60    Heart Rate (Admit) 118 bpm    Heart Rate (Exercise) 117 bpm    Heart Rate (Exit) 100 bpm    Rating of Perceived Exertion (Exercise) 11    Symptoms none    Duration Continue with 30 min of aerobic exercise without signs/symptoms of physical distress.    Intensity THRR unchanged      Progression   Progression Continue to progress workloads to maintain intensity without signs/symptoms of physical distress.    Average METs 3      Resistance Training   Training Prescription Yes    Weight 4 lb    Reps 10-15      Interval Training   Interval Training No      REL-XR   Level  5    Minutes 15    METs 4      Track   Laps 20    Minutes 15    METs 2.09      Home Exercise Plan   Plans to continue exercise at Home (comment)   walk   Frequency Add 2 additional days to program exercise sessions.    Initial Home Exercises Provided 08/12/21             Nutrition:  Target Goals: Understanding of nutrition guidelines, daily intake of sodium <15100m, cholesterol <2033m calories 30% from fat and 7% or less from saturated fats, daily to have 5 or more servings of fruits and vegetables.  Education: All About Nutrition: -Group instruction provided by verbal, written material, interactive activities, discussions,  models, and posters to present general guidelines for heart healthy nutrition including fat, fiber, MyPlate, the role of sodium in heart healthy nutrition, utilization of the nutrition label, and utilization of this knowledge for meal planning. Follow up email sent as well. Written material given at graduation. [facility]  Date 07/27/21  [facility]ator [facility] Review Code 1- Verbalizes Understanding       Biometrics:  Pre Biometrics - 07/12/21 1017       Pre Biometrics   Height '5\' 11"'  (1.803 m)    Weight 199 lb 6.4 oz (90.4 kg)    BMI (Calculated) 27.82    Single Leg Stand 3.8 seconds             Post Biometrics - 10/07/21 1113        Post  Biometrics   Height 5' 11.9" (1.826 m)    Weight 200 lb (90.7 kg)    BMI (Calculated) 27.21    Single Leg Stand 4.4 seconds             Nutrition Therapy Plan and Nutrition Goals:  Nutrition Therapy & Goals - 08/15/21 1634       Nutrition Therapy   Diet Heart healthy, low Na, diabetes friendly    Drug/Food Interactions Food/Disease   oxalate rich food and kidney stones   Protein (specify units) 70g    Fiber 30 grams    Whole Grain Foods 3 servings    Saturated Fats 12 max. grams    Fruits and Vegetables 8 servings/day    Sodium 1.5 grams      Personal Nutrition Goals   Nutrition Goal ST: include fruits and vegetables with low to moderate oxalates and limit those high in oxalates, eat another portion of food or at fat such as avocado to toast to add to satiety LT: lower risk of kidney stones while managing other health conditions, honor hunger.    Comments BG: He does not usually test it. Last A1C 7.5 August. He reports he is hungry all the time. B: oatmeal or just an egg or two with one piece of toast (whole wheat) with small amount of butter. 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 L: sandwich or sometimes soup - canned (sometimes will not eat lunch)  S: fruit (apples or banana) D: it[place] normally - spinach pasta, fish or chicken and some beef, he is trying to eliminate high oxalate foods and is now realizing that spinach is high in oxalates. Discussed heart healthy eating, diabetes friendly eating and eating to lower risk of kidney stones.      Intervention Plan   Intervention Prescribe, educate and  counsel regarding individualized specific dietary modifications aiming towards targeted core components such as weight, hypertension, lipid management, diabetes, heart failure and other comorbidities.;Nutrition handout(s) given to patient.    Expected Outcomes Short Term Goal: Understand basic principles of dietary content, such as calories, fat, sodium, cholesterol and nutrients.;Short Term Goal: A plan has been developed with personal nutrition goals set during dietitian appointment.;Long Term Goal: Adherence to prescribed nutrition plan.             Nutrition Assessments:  MEDIFICTS Score Key: ?70 Need to make dietary changes  40-70 Heart Healthy Diet ? 40 Therapeutic Level Cholesterol Diet  Flowsheet Row Cardiac Rehab from 10/07/2021 in [facility] Cardiac and Pulmonary Rehab  Picture Your Plate Total Score on Admission 78  Picture Your Plate Total Score on Discharge 73      Picture Your Plate Scores: <75 Unhealthy dietary pattern with much room for improvement. 41-50 Dietary pattern unlikely to meet recommendations for good health and room for improvement. 51-60 More healthful dietary pattern, with some room for improvement.  >60 Healthy dietary pattern, although there may be some specific behaviors that could be improved.    Nutrition Goals Re-Evaluation:  Nutrition Goals Re-Evaluation     [place] Name 07/22/21 1133             Goals   Current Weight 204 lb (92.5 kg)       Nutrition Goal Decrease red meat.       Comment He states that he eats red meat twice a week. Sometimes he eats to much and other times he doesnt eat  enough. After his surgery his weight had went down to 195. He hope that his weight gain is from working out. Canned soups are something he feels like he can cut back on to help his weight because he knows they have alot of sodium. Meet with the dietitian.       Expected Outcome Short: meet with the dietitian and cut back on canned soups. Long: maintain a diet that pertains to his needs.                Nutrition Goals Discharge (Final Nutrition Goals Re-Evaluation):  Nutrition Goals Re-Evaluation - 07/22/21 1133       Goals   Current Weight 204 lb (92.5 kg)    Nutrition Goal Decrease red meat.    Comment He states that he eats red meat twice a week. Sometimes he eats to much and other times he doesnt eat enough. After his surgery his weight had went down to 195. He hope that his weight gain is from working out. Canned soups are something he feels like he can cut back on to help his weight because he knows they have alot of sodium. Meet with the dietitian.    Expected Outcome Short: meet with the dietitian and cut back on canned soups. Long: maintain a diet that pertains to his needs.             Psychosocial: Target Goals: Acknowledge presence or absence of significant depression and/or stress, maximize coping skills, provide positive support system. Participant is able to verbalize types and ability to use techniques and skills needed for reducing stress and depression.   Education: Stress, Anxiety, and Depression - Group verbal and visual presentation to define topics covered.  Reviews how body is impacted by stress, anxiety, and depression.  Also discusses healthy ways to reduce stress and to treat/manage anxiety and depression.  Written material given at graduation.  Flowsheet Row Cardiac Rehab from 10/05/2021 in [facility] Cardiac and Pulmonary Rehab  Date 08/17/21  Educator [facility]  Instruction Review Code 1- Verbalizes Understanding       Education: Sleep Hygiene -Provides group  verbal and written instruction about how sleep can affect your health.  Define sleep hygiene, discuss sleep cycles and impact of sleep habits. Review good sleep hygiene tips.    Initial Review & Psychosocial Screening:  Initial Psych Review & Screening - 06/29/21 1411       Initial Review   Current issues with None Identified      Family Dynamics   Good Support System? Yes   wife   Concerns Inappropriate over/under dependence on family/friends      Barriers   Psychosocial barriers to participate in program There are no identifiable barriers or psychosocial needs.;The patient should benefit from training in stress management and relaxation.      Screening Interventions   Interventions Encouraged to exercise;To provide support and resources with identified psychosocial needs;Provide feedback about the scores to participant    Expected Outcomes Short Term goal: Identification and review with participant of any Quality of Life or Depression concerns found by scoring the questionnaire.;Short Term goal: Utilizing psychosocial counselor, staff and physician to assist with identification of specific Stressors or current issues interfering with healing process. Setting desired goal for each stressor or current issue identified.;Long Term Goal: Stressors or current issues are controlled or eliminated.;Long Term goal: The participant improves quality of Life and PHQ9 Scores as seen by post scores and/or verbalization of changes             Quality of Life Scores:   Quality of Life - 10/07/21 1124       Quality of Life Scores   Health/Function Pre 24.8 %    Health/Function Post 25.5 %    Health/Function % Change 2.82 %    Socioeconomic Pre 30 %    Socioeconomic Post 28 %    Socioeconomic % Change  -6.67 %    Psych/Spiritual Pre 30 %    Psych/Spiritual Post 28.29 %    Psych/Spiritual % Change -5.7 %    Family Pre 30 %    Family Post 30 %    Family % Change 0 %    GLOBAL Pre 27.56 %     GLOBAL Post 27.23 %    GLOBAL % Change -1.2 %            Scores of 19 and below usually indicate a poorer quality of life in these areas.  A difference of  2-3 points is a clinically meaningful difference.  A difference of 2-3 points in the total score of the Quality of Life Index has been associated with significant improvement in overall quality of life, self-image, physical symptoms, and general health in studies assessing change in quality of life.  PHQ-9: Recent Review Flowsheet Data     Depression screen [facility] 2/9 10/07/2021 07/12/2021   Decreased Interest 2 2   Down, Depressed, Hopeless 1 2   PHQ - 2 Score 3 4   Altered sleeping 2 1   Tired, decreased energy 1 3   Change in appetite 1 2   Feeling bad or failure about yourself  1 2   Trouble concentrating 2 1   Moving slowly or fidgety/restless 0 2   Suicidal thoughts 0 0   PHQ-9 Score 10 15   Difficult doing work/chores Somewhat difficult Not difficult at all  Interpretation of Total Score  Total Score Depression Severity:  1-4 = Minimal depression, 5-9 = Mild depression, 10-14 = Moderate depression, 15-19 = Moderately severe depression, 20-27 = Severe depression   Psychosocial Evaluation and Intervention:  Psychosocial Evaluation - 06/29/21 1417       Psychosocial Evaluation & Interventions   Interventions Encouraged to exercise with the program and follow exercise prescription    Comments 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 He states in the first few weeks there was some anxiety and maybe even some depression related to his surgery and recovery, but he said the last two weeks he has been feeling a lot better. His wife, who is a Marine scientist, has been very supportive and helped keep him on track. He has been walking three days a week and reports feeling well after. They are looking forward to starting cardiac rehab for education and exercise guidelines    Expected Outcomes Short: attend cardiac rehab for education and  exercise. Long: develop and maintain positive self care habits.    Continue Psychosocial Services  Follow up required by staff             Psychosocial Re-Evaluation:  Psychosocial Re-Evaluation     [place] Name 07/22/21 1139 08/22/21 1129           Psychosocial Re-Evaluation   Current issues with Current Sleep Concerns;Current Depression Current Sleep Concerns;Current Depression      Comments Art takes a sleeping pill at night to help him get to sleep. He wants to come off of it. He states that his heart procedure is giving him some depression. Informed patient that if he feels like his depression is getting worse to let us and his doctor know. He continues to take a sleeping pill, helps most of the time - he would like to start weaning (it his habit forming) - encouraged him to work with his MD. He is more stressed now this his wife is away he reports he will better once she returns. He reports not taking any medication for depression, but feels he is doing well. His wife is a good support system for him. Art has a good sense of humor and feels that it helps with his mood. He likes to watch TV and goes on walks with his wife.      Expected Outcomes Short: Continue to exercise regularly to support mental health and notify staff of any changes. Long: maintain mental health and well being through teaching of rehab or prescribed medications independently. Short: Continue to exercise regularly to support mental health and notify staff of any changes. Long: maintain mental health and well being through teaching of rehab or prescribed medications independently.      Interventions Encouraged to attend Cardiac Rehabilitation for the exercise;Stress management education;Relaxation education Encouraged to attend Cardiac Rehabilitation for the exercise;Stress management education;Relaxation education      Continue Psychosocial Services  Follow up required by staff Follow up required by staff                Psychosocial Discharge (Final Psychosocial Re-Evaluation):  Psychosocial Re-Evaluation - 08/22/21 1129       Psychosocial Re-Evaluation   Current issues with Current Sleep Concerns;Current Depression    Comments He continues to take a sleeping pill, helps most of the time - he would like to start weaning (it his habit forming) - encouraged him to work with his MD. He is more stressed now this his wife is away  he reports he will better once she returns. He reports not taking any medication for depression, but feels he is doing well. His wife is a good support system for him. Art has a good sense of humor and feels that it helps with his mood. He likes to watch TV and goes on walks with his wife.    Expected Outcomes Short: Continue to exercise regularly to support mental health and notify staff of any changes. Long: maintain mental health and well being through teaching of rehab or prescribed medications independently.    Interventions Encouraged to attend Cardiac Rehabilitation for the exercise;Stress management education;Relaxation education    Continue Psychosocial Services  Follow up required by staff             Vocational Rehabilitation: Provide vocational rehab assistance to qualifying candidates.   Vocational Rehab Evaluation & Intervention:  Vocational Rehab - 10/07/21 1123       Discharge Vocational Rehab   Discharge Vocational Rehabilitation retired             Education: Education Goals: Education classes will be provided on a variety of topics geared toward better understanding of heart health and risk factor modification. Participant will state understanding/return demonstration of topics presented as noted by education test scores.  Learning Barriers/Preferences:  Learning Barriers/Preferences - 06/29/21 1419       Learning Barriers/Preferences   Learning Barriers None    Learning Preferences None             General Cardiac Education  Topics:  AED/CPR: - Group verbal and written instruction with the use of models to demonstrate the basic use of the AED with the basic ABC's of resuscitation.   Anatomy and Cardiac Procedures: - Group verbal and visual presentation and models provide information about basic cardiac anatomy and function. Reviews the testing methods done to diagnose heart disease and the outcomes of the test results. Describes the treatment choices: Medical Management, Angioplasty, or Coronary Bypass Surgery for treating various heart conditions including Myocardial Infarction, Angina, Valve Disease, and Cardiac Arrhythmias.  Written material given at graduation.   Medication Safety: - Group verbal and visual instruction to review commonly prescribed medications for heart and lung disease. Reviews the medication, class of the drug, and side effects. Includes the steps to properly store meds and maintain the prescription regimen.  Written material given at graduation. Flowsheet Row Cardiac Rehab from 10/05/2021 in [facility] Cardiac and Pulmonary Rehab  Date 09/28/21  Educator SB  Instruction Review Code 1- Verbalizes Understanding       Intimacy: - Group verbal instruction through game format to discuss how heart and lung disease can affect sexual intimacy. Written material given at graduation.. Flowsheet Row Cardiac Rehab from 10/05/2021 in [facility] Cardiac and Pulmonary Rehab  Date 08/31/21  Educator [facility]  Instruction Review Code 1- Verbalizes Understanding       Know Your Numbers and Heart Failure: - Group verbal and visual instruction to discuss disease risk factors for cardiac and pulmonary disease and treatment options.  Reviews associated critical values for Overweight/Obesity, Hypertension, Cholesterol, and Diabetes.  Discusses basics of heart failure: signs/symptoms and treatments.  Introduces Heart Failure Zone chart for action plan for heart failure.  Written material given at graduation. Flowsheet Row  Cardiac Rehab from 10/05/2021 in [facility] Cardiac and Pulmonary Rehab  Education need identified 07/12/21  Date 10/05/21  Educator SB  Instruction Review Code 1- Verbalizes Understanding       Infection Prevention: - Provides verbal and  written material to individual with discussion of infection control including proper hand washing and proper equipment cleaning during exercise session. Flowsheet Row Cardiac Rehab from 10/05/2021 in [facility] Cardiac and Pulmonary Rehab  Date 07/12/21  Educator [facility]  Instruction Review Code 1- Verbalizes Understanding       Falls Prevention: - Provides verbal and written material to individual with discussion of falls prevention and safety. Flowsheet Row Cardiac Rehab from 10/05/2021 in [facility] Cardiac and Pulmonary Rehab  Date 07/12/21  Educator [facility]  Instruction Review Code 1- Verbalizes Understanding       Other: -Provides group and verbal instruction on various topics (see comments)   Knowledge Questionnaire Score:  Knowledge Questionnaire Score - 10/07/21 1123       Knowledge Questionnaire Score   Pre Score 21/26    Post Score 26/26             Core Components/Risk Factors/Patient Goals at Admission:  Personal Goals and Risk Factors at Admission - 07/12/21 1021       Core Components/Risk Factors/Patient Goals on Admission    Weight Management Yes;Weight Loss    Intervention Weight Management: Develop a combined nutrition and exercise program designed to reach desired caloric intake, while maintaining appropriate intake of nutrient and fiber, sodium and fats, and appropriate energy expenditure required for the weight goal.;Weight Management: Provide education and appropriate resources to help participant work on and attain dietary goals.;Weight Management/Obesity: Establish reasonable short term and long term weight goals.    Admit Weight 199 lb 6.4 oz (90.4 kg)    Goal Weight: Short Term 194 lb (88 kg)    Goal Weight: Long Term 190 lb  (86.2 kg)    Expected Outcomes Short Term: Continue to assess and modify interventions until short term weight is achieved;Long Term: Adherence to nutrition and physical activity/exercise program aimed toward attainment of established weight goal;Weight Loss: Understanding of general recommendations for a balanced deficit meal plan, which promotes 1-2 lb weight loss per week and includes a negative energy balance of [phone] kcal/d;Understanding recommendations for meals to include 15-35% energy as protein, 25-35% energy from fat, 35-60% energy from carbohydrates, less than 275m of dietary cholesterol, 20-35 gm of total fiber daily;Understanding of distribution of calorie intake throughout the day with the consumption of 4-5 meals/snacks    Heart Failure Yes    Intervention Provide a combined exercise and nutrition program that is supplemented with education, support and counseling about heart failure. Directed toward relieving symptoms such as shortness of breath, decreased exercise tolerance, and extremity edema.    Expected Outcomes Improve functional capacity of life;Short term: Attendance in program 2-3 days a week with increased exercise capacity. Reported lower sodium intake. Reported increased fruit and vegetable intake. Reports medication compliance.;Short term: Daily weights obtained and reported for increase. Utilizing diuretic protocols set by physician.;Long term: Adoption of self-care skills and reduction of barriers for early signs and symptoms recognition and intervention leading to self-care maintenance.    Hypertension Yes    Intervention Provide education on lifestyle modifcations including regular physical activity/exercise, weight management, moderate sodium restriction and increased consumption of fresh fruit, vegetables, and low fat dairy, alcohol moderation, and smoking cessation.;Monitor prescription use compliance.    Expected Outcomes Short Term: Continued assessment and  intervention until BP is < 140/955mHG in hypertensive participants. < 130/8078mG in hypertensive participants with diabetes, heart failure or chronic kidney disease.;Long Term: Maintenance of blood pressure at goal levels.    Lipids Yes    Intervention  Provide education and support for participant on nutrition & aerobic/resistive exercise along with prescribed medications to achieve LDL <72m, HDL >469m    Expected Outcomes Short Term: Participant states understanding of desired cholesterol values and is compliant with medications prescribed. Participant is following exercise prescription and nutrition guidelines.;Long Term: Cholesterol controlled with medications as prescribed, with individualized exercise RX and with personalized nutrition plan. Value goals: LDL < 7055mHDL > 40 mg.             Education:Diabetes - Individual verbal and written instruction to review signs/symptoms of diabetes, desired ranges of glucose level fasting, after meals and with exercise. Acknowledge that pre and post exercise glucose checks will be done for 3 sessions at entry of program. [facility]  Date 06/29/21  [facility] Code 1- Verbalizes Understanding       Core Components/Risk Factors/Patient Goals Review:   Goals and Risk Factor Review     Row Name 07/22/21 1126 08/22/21 1115           Core Components/Risk Factors/Patient Goals Review   Personal Goals Review Hypertension;Diabetes Diabetes;Hypertension      Review Art has been checking his blood pressure at home ecery other day and his hreading are doing well. He states he is 100-110s over 60s on average. Art has spoke to his doctor about his diabetes and is checking his A1C every three months. He is watching what he eats but he states he is hungy alot. His provider states that his A1C should be below 8. He takes metforamin in the morning and at night. He  goes by how he feels and knows how his sugars are stable. Art and his wife both do not check their BG and they fo by A1C readings (last one 7.5, due for his next one). Art has been trying to eat in a way that prevents kidney stones as well as supports his diabetes and heart - working with RD. He is still checking his BP every other day at home - 110-20/60-70. He reports taking his medication as prescribed and is having no issues.      Expected Outcomes Short: maintain blood pressure reading in class and watch diet for his diabetes. Long: keep brining down A1C. Short: continue monitoring risk factors Long: keep brining down A1C.               Core Components/Risk Factors/Patient Goals at Discharge (Final Review):   Goals and Risk Factor Review - 08/22/21 1115       Core Components/Risk Factors/Patient Goals Review   Personal Goals Review Diabetes;Hypertension    Review Art and his wife both do not check their BG and they fo by A1C readings (last one 7.5, due for his next one). Art has been trying to eat in a way that prevents kidney stones as well as supports his diabetes and heart - working with RD. He is still checking his BP every other day at home - 110-20/60-70. He reports taking his medication as prescribed and is having no issues.    Expected Outcomes Short: continue monitoring risk factors Long: keep brining down A1C.             ITP Comments:  ITP Comments     Row Name 06/29/21 1437 07/12/21 1013 07/13/21 0647 07/15/21 1108 08/10/21 0946   ITP Comments Initial telephone orientation completed. [facility] EP orientation scheduled  Tuesday 9/6 at 8am. Completed 6MWT and gym orientation. Initial ITP created and sent for review to Dr. Emily Filbert, Medical Director. 30 Day review completed. Medical Director ITP review done, changes made as directed, and signed approval by Medical Director. First full day of exercise!  Patient was oriented to gym and equipment  including functions, settings, policies, and procedures.  Patient's individual exercise prescription and treatment plan were reviewed.  All starting workloads were established based on the results of the 6 minute walk test done at initial orientation visit.  The plan for exercise progression was also introduced and progression will be customized based on patient's performance and goals. 30 day review completed. ITP sent to Dr. Emily Filbert, Medical Director of Cardiac Rehab. Continue with ITP unless changes are made by physician.    [place] Name 08/15/21 1719 09/07/21 0747 09/19/21 1442 10/04/21 0745 10/05/21 0728   ITP Comments Completed initial RD evaluation 30 Day review completed. Medical Director ITP review done, changes made as directed, and signed approval by Medical Director. Art is currently out on hold with a DVT.  He will need clearance to return. Art only attended rehab 2 days during 30-day review cycle due to DVT - unable to review goals this cycle. 30 Day review completed. Medical Director ITP review done, changes made as directed, and signed approval by Medical Director.    [place] Name 10/10/21 [phone]           ITP Comments Art called to ask if he could make last Friday his last day.  He completed his walk test and paperwork that day so we will go ahead and graduate him out.  His knee and neck are really starting to bother him as he is not currently able to get his regular injections.                Comments: Discharge ITP

## 2021-10-10 NOTE — Telephone Encounter (Signed)
Ralph Marsh called to ask if he could make last Friday his last day.  He completed his walk test and paperwork that day so we will go ahead and graduate him out.  His knee and neck are really starting to bother him as he is not currently able to get his regular injections.

## 2021-10-10 NOTE — Telephone Encounter (Signed)
I called and made the pts wife aware of the medication refill.

## 2021-10-10 NOTE — Telephone Encounter (Signed)
yes

## 2021-10-10 NOTE — Progress Notes (Signed)
Discharge Progress Report  Patient Details  Name: Ralph Marsh MRN: [id number] Date of Birth: May 29, 1946 Referring Provider:   Flowsheet Row Cardiac Rehab from 07/12/2021 in [facility]  Referring Provider Brett Canales MD ([place])        Number of Visits: 32  Reason for Discharge:  Patient reached a stable level of exercise. Patient independent in their exercise. Patient has met program and personal goals.  Smoking History:  Social History   Tobacco Use  Smoking Status Former   Packs/day: 1.00   Years: 15.00   Pack years: 15.00   Types: Cigarettes   Quit date: 1995   Years since quitting: 27.9  Smokeless Tobacco Never    Diagnosis:  S/P CABG x 2  ADL UCSD:   Initial Exercise Prescription:  Initial Exercise Prescription - 07/12/21 1000       Date of Initial Exercise RX and Referring Provider   Date 07/12/21    Referring Provider [facility] Canales MD ([facility])      Treadmill   MPH 1.5    Grade 0.5    Minutes 15    METs 2.26      REL-XR   Level 1    Speed 50    Minutes 15    METs 1.5      T5 Nustep   Level 2    SPM 80    Minutes 15    METs 2      Track   Laps 23    Minutes 15    METs 2.25      Prescription Details   Frequency (times per week) 3    Duration Progress to 30 minutes of continuous aerobic without signs/symptoms of physical distress      Intensity   THRR 40-80% of Max Heartrate 108-131    Ratings of Perceived Exertion 11-13    Perceived Dyspnea 0-4      Progression   Progression Continue to progress workloads to maintain intensity without signs/symptoms of physical distress.      Resistance Training   Training Prescription Yes    Weight 3 lb    Reps 10-15             Discharge Exercise Prescription (Final Exercise Prescription Changes):  Exercise Prescription Changes - 10/03/21 1600       Response to Exercise   Blood Pressure (Admit) 102/64    Blood Pressure (Exit) 110/60    Heart Rate (Admit) 118  bpm    Heart Rate (Exercise) 117 bpm    Heart Rate (Exit) 100 bpm    Rating of Perceived Exertion (Exercise) 11    Symptoms none    Duration Continue with 30 min of aerobic exercise without signs/symptoms of physical distress.    Intensity THRR unchanged      Progression   Progression Continue to progress workloads to maintain intensity without signs/symptoms of physical distress.    Average METs 3      Resistance Training   Training Prescription Yes    Weight 4 lb    Reps 10-15      Interval Training   Interval Training No      REL-XR   Level 5    Minutes 15    METs 4      Track   Laps 20    Minutes 15    METs 2.09      Home Exercise Plan   Plans to continue exercise at Home (comment)  walk   Frequency Add 2 additional days to program exercise sessions.    Initial Home Exercises Provided 08/12/21             Functional Capacity:  6 Minute Walk     Row Name 07/12/21 1013 10/07/21 1112       6 Minute Walk   Phase Initial Discharge    Distance 890 feet 1060 feet    Distance % Change -- 19.1 %    Distance Feet Change -- 170 ft    Walk Time 6 minutes 6 minutes    # of Rest Breaks 0 0    MPH 1.67 2    METS 2.09 2.5    RPE 11 11    Perceived Dyspnea  1 --    VO2 Peak 7.32 8.76    Symptoms Yes (comment) No    Comments leg pain from swelling (2/10), full inflation feels tight in chest leg/knee pain 2/10    Resting HR 84 bpm 86 bpm    Resting BP 132/66 118/64    Resting Oxygen Saturation  94 % 93 %    Exercise Oxygen Saturation  during 6 min walk 96 % 94 %    Max Ex. HR 115 bpm 138 bpm    Max Ex. BP 136/74 126/64    2 Minute Post BP 112/64 --             Psychological, QOL, Others - Outcomes: PHQ 2/9: Depression screen [facility] 2/9 10/07/2021 07/12/2021  Decreased Interest 2 2  Down, Depressed, Hopeless 1 2  PHQ - 2 Score 3 4  Altered sleeping 2 1  Tired, decreased energy 1 3  Change in appetite 1 2  Feeling bad or failure about yourself  1 2   Trouble concentrating 2 1  Moving slowly or fidgety/restless 0 2  Suicidal thoughts 0 0  PHQ-9 Score 10 15  Difficult doing work/chores Somewhat difficult Not difficult at all    Quality of Life:  Quality of Life - 10/07/21 1124       Quality of Life Scores   Health/Function Pre 24.8 %    Health/Function Post 25.5 %    Health/Function % Change 2.82 %    Socioeconomic Pre 30 %    Socioeconomic Post 28 %    Socioeconomic % Change  -6.67 %    Psych/Spiritual Pre 30 %    Psych/Spiritual Post 28.29 %    Psych/Spiritual % Change -5.7 %    Family Pre 30 %    Family Post 30 %    Family % Change 0 %    GLOBAL Pre 27.56 %    GLOBAL Post 27.23 %    GLOBAL % Change -1.2 %                     Nutrition & Weight - Outcomes:  Pre Biometrics - 07/12/21 1017       Pre Biometrics   Height _0  (1.803 m)    Weight 199 lb 6.4 oz (90.4 kg)    BMI (Calculated) 27.82    Single Leg Stand 3.8 seconds             Post Biometrics - 10/07/21 1113        Post  Biometrics   Height 5' 11.9" (1.826 m)    Weight 200 lb (90.7 kg)    BMI (Calculated) 27.21    Single Leg Stand 4.4 seconds  Nutrition:  Nutrition Therapy & Goals - 08/15/21 1634       Nutrition Therapy   Diet Heart healthy, low Na, diabetes friendly    Drug/Food Interactions Food/Disease   oxalate rich food and kidney stones   Protein (specify units) 70g    Fiber 30 grams    Whole Grain Foods 3 servings    Saturated Fats 12 max. grams    Fruits and Vegetables 8 servings/day    Sodium 1.5 grams      Personal Nutrition Goals   Nutrition Goal ST: include fruits and vegetables with low to moderate oxalates and limit those high in oxalates, eat another portion of food or at fat such as avocado to toast to add to satiety LT: lower risk of kidney stones while managing other health conditions, honor hunger.    Comments BG: He does not usually test it. Last A1C 7.5 August. He reports he is hungry  all the time. B: oatmeal or just an egg or two with one piece of toast (whole wheat) with small amount of butter. 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 L: sandwich or sometimes soup - canned (sometimes will not eat lunch) S: fruit (apples or banana) D: [place] food normally - spinach pasta, fish or chicken and some beef, he is trying to eliminate high oxalate foods and is now realizing that spinach is high in oxalates. Discussed heart healthy eating, diabetes friendly eating and eating to lower risk of kidney stones.      Intervention Plan   Intervention Prescribe, educate and counsel regarding individualized specific dietary modifications aiming towards targeted core components such as weight, hypertension, lipid management, diabetes, heart failure and other comorbidities.;Nutrition handout(s) given to patient.    Expected Outcomes Short Term Goal: Understand basic principles of dietary content, such as calories, fat, sodium, cholesterol and nutrients.;Short Term Goal: A plan has been developed with personal nutrition goals set during dietitian appointment.;Long Term Goal: Adherence to prescribed nutrition plan.             Nutrition Discharge:   Education Questionnaire Score:  Knowledge Questionnaire Score - 10/07/21 1123       Knowledge Questionnaire Score   Pre Score 21/26    Post Score 26/26             Goals reviewed with patient; copy given to patient.

## 2022-03-21 ENCOUNTER — Other Ambulatory Visit (INDEPENDENT_AMBULATORY_CARE_PROVIDER_SITE_OTHER): Payer: Self-pay | Admitting: Vascular Surgery

## 2022-03-21 DIAGNOSIS — I82412 Acute embolism and thrombosis of left femoral vein: Secondary | ICD-10-CM

## 2022-03-23 ENCOUNTER — Other Ambulatory Visit (INDEPENDENT_AMBULATORY_CARE_PROVIDER_SITE_OTHER): Payer: No Typology Code available for payment source

## 2022-03-23 ENCOUNTER — Ambulatory Visit (INDEPENDENT_AMBULATORY_CARE_PROVIDER_SITE_OTHER): Payer: Medicare HMO

## 2022-03-23 ENCOUNTER — Encounter (INDEPENDENT_AMBULATORY_CARE_PROVIDER_SITE_OTHER): Payer: Self-pay | Admitting: Vascular Surgery

## 2022-03-23 ENCOUNTER — Ambulatory Visit (INDEPENDENT_AMBULATORY_CARE_PROVIDER_SITE_OTHER): Payer: Medicare HMO | Admitting: Vascular Surgery

## 2022-03-23 VITALS — BP 125/84 | HR 93 | Resp 16 | Wt 207.0 lb

## 2022-03-23 DIAGNOSIS — I82412 Acute embolism and thrombosis of left femoral vein: Secondary | ICD-10-CM | POA: Diagnosis not present

## 2022-03-23 DIAGNOSIS — E1169 Type 2 diabetes mellitus with other specified complication: Secondary | ICD-10-CM

## 2022-03-23 DIAGNOSIS — I82513 Chronic embolism and thrombosis of femoral vein, bilateral: Secondary | ICD-10-CM | POA: Diagnosis not present

## 2022-03-23 DIAGNOSIS — E7849 Other hyperlipidemia: Secondary | ICD-10-CM | POA: Diagnosis not present

## 2022-03-23 DIAGNOSIS — I10 Essential (primary) hypertension: Secondary | ICD-10-CM

## 2022-03-23 DIAGNOSIS — E785 Hyperlipidemia, unspecified: Secondary | ICD-10-CM

## 2022-03-26 ENCOUNTER — Encounter (INDEPENDENT_AMBULATORY_CARE_PROVIDER_SITE_OTHER): Payer: Self-pay | Admitting: Vascular Surgery

## 2022-03-26 NOTE — Progress Notes (Signed)
MRN : [id number]  Ralph Marsh is a 76 y.o. (April 21, 1946) male who presents with chief complaint of follow up DVT.  History of Present Illness:   The patient presents to the office for evaluation of DVT.  DVT was identified at [facility] by Duplex ultrasound.  The initial symptoms were pain and swelling in the lower extremity.  The patient notes the affected leg continues to be painful with dependency and swells with dependency.  Symptoms are much better with elevation.  The patient notes minimal edema in the morning which steadily worsens throughout the day.    The patient has not been using compression therapy at this point.  The patient is on anticoagulation.  No SOB or pleuritic chest pains.  No cough or hemoptysis.  No blood per rectum or blood in any sputum.  No excessive bruising per the patient.   No recent shortening of the patient's walking distance or new symptoms consistent with claudication.  No history of rest pain symptoms. No new ulcers or wounds of the lower extremities have occurred.  The patient denies amaurosis fugax or recent TIA symptoms. There are no recent neurological changes noted. No recent episodes of angina or shortness of breath documented.   Duplex ultrasound of the right lower extremity shows resolution of his previous venous changes when compared to the study 09/22/2021.    Current Meds  Medication Sig   allopurinol (ZYLOPRIM) 300 MG tablet Take 1 tablet by mouth daily.   apixaban (ELIQUIS) 5 MG TABS tablet 1 tab ('5mg'$ ) po twice a day   aspirin EC 81 MG tablet Take 81 mg by mouth daily.   Cholecalciferol 1000 UNITS tablet Take 1,000 Units by mouth 2 (two) times daily.   esomeprazole (NEXIUM) 40 MG capsule Take 1 capsule by mouth daily.   Eszopiclone 3 MG TABS Take 3 mg by mouth at bedtime as needed (sleep).   furosemide (LASIX) 20 MG tablet Take 40 mg by mouth daily at 12 noon.   metoprolol tartrate (LOPRESSOR) 25 MG tablet Take 0.5 tablets (12.5  mg total) by mouth 2 (two) times daily. (Patient taking differently: Take 50 mg by mouth 2 (two) times daily.)   potassium chloride SA (KLOR-CON) 20 MEQ tablet Take 20 mEq by mouth in the morning.   PRALUENT 150 MG/ML SOAJ Inject into the skin.   ZETIA 10 MG tablet daily.    Past Medical History:  Diagnosis Date   Arthritis    BPH (benign prostatic hyperplasia)    Diabetes mellitus, type 2 (HCC)    DVT (deep venous thrombosis) ([place]) 05/12/2017   left upper leg   Emphysema of lung ([place])    mild - on xray   Fatty liver    GERD (gastroesophageal reflux disease)    Hypertension    Kidney stone     Past Surgical History:  Procedure Laterality Date   CATARACT EXTRACTION W/PHACO Right 09/11/2018   Procedure: CATARACT EXTRACTION PHACO AND INTRAOCULAR LENS PLACEMENT ([place])  RIGHT DIABETIC;  Surgeon: Leandrew Koyanagi, MD;  Location: [place];  Service: Ophthalmology;  Laterality: Right;  Diabetic - oral meds Latex sensitiviy   CATARACT EXTRACTION W/PHACO Left 10/09/2018   Procedure: CATARACT EXTRACTION PHACO AND INTRAOCULAR LENS PLACEMENT ([place])  LEFT DIABETIC;  Surgeon: Leandrew Koyanagi, MD;  Location: [place];  Service: Ophthalmology;  Laterality: Left;  Diabetic - oral meds Latex sensitivity   COLONOSCOPY WITH PROPOFOL N/A 07/28/2019   Procedure: COLONOSCOPY WITH PROPOFOL;  Surgeon: [place],  Benay Pike, MD;  Location: [facility];  Service: Gastroenterology;  Laterality: N/A;   EXTRACORPOREAL SHOCK WAVE LITHOTRIPSY Left 01/25/2017   Procedure: EXTRACORPOREAL SHOCK WAVE LITHOTRIPSY (ESWL);  Surgeon: Nickie Retort, MD;  Location: [facility] ORS;  Service: Urology;  Laterality: Left;   LASER OF PROSTATE W/ GREEN LIGHT PVP  2008   Dr. Quillian Quince   LEFT HEART CATH AND CORONARY ANGIOGRAPHY N/A 05/16/2021   Procedure: LEFT HEART CATH AND CORONARY ANGIOGRAPHY;  Surgeon: Corey Skains, MD;  Location: [place] [facility];  Service: Cardiovascular;  Laterality: N/A;    LUMBAR LAMINECTOMY     PERIPHERAL VASCULAR THROMBECTOMY Right 09/13/2021   Procedure: PERIPHERAL VASCULAR THROMBECTOMY;  Surgeon: Katha Cabal, MD;  Location: [place] [facility];  Service: Cardiovascular;  Laterality: Right;   TONSILLECTOMY AND ADENOIDECTOMY     URETEROSCOPY WITH HOLMIUM LASER LITHOTRIPSY  2004    Social History Social History   Tobacco Use   Smoking status: Former    Packs/day: 1.00    Years: 15.00    Pack years: 15.00    Types: Cigarettes    Quit date: 1995    Years since quitting: 28.4   Smokeless tobacco: Never  Vaping Use   Vaping Use: Never used  Substance Use Topics   Alcohol use: Yes    Alcohol/week: 0.0 standard drinks    Comment: Holidays only   Drug use: No    Family History Family History  Problem Relation Age of Onset   Kidney disease Neg Hx    Kidney cancer Neg Hx    Prostate cancer Neg Hx     Allergies  Allergen Reactions   Augmentin [Amoxicillin-Pot Clavulanate] Anaphylaxis   Latex Other (See Comments)    Nasal inflammation   Other Anaphylaxis   Solifenacin Other (See Comments)   Sulfa Antibiotics Anaphylaxis   Rosuvastatin     Other reaction(s): Muscle pain Other reaction(s): Muscle pain   Atorvastatin     Other reaction(s): Muscle Pain, Pain in lower limb   Omeprazole     Other reaction(s): Heartburn   Pravastatin     Other reaction(s): Muscle pain   Simvastatin Other (See Comments)    Other reaction(s): Fatigue, Muscle pain Other reaction(s): Fatigue, Muscle pain     REVIEW OF SYSTEMS (Negative unless checked)  Constitutional: '[]'$ Weight loss  '[]'$ Fever  '[]'$ Chills Cardiac: '[]'$ Chest pain   '[]'$ Chest pressure   '[]'$ Palpitations   '[]'$ Shortness of breath when laying flat   '[]'$ Shortness of breath with exertion. Vascular:  '[]'$ Pain in legs with walking   '[x]'$ Pain in legs at rest  '[x]'$ History of DVT   '[]'$ Phlebitis   '[x]'$ Swelling in legs   '[]'$ Varicose veins   '[]'$ Non-healing ulcers Pulmonary:   '[]'$ Uses home oxygen   '[]'$ Productive cough    '[]'$ Hemoptysis   '[]'$ Wheeze  '[]'$ COPD   '[]'$ Asthma Neurologic:  '[]'$ Dizziness   '[]'$ Seizures   '[]'$ History of stroke   '[]'$ History of TIA  '[]'$ Aphasia   '[]'$ Vissual changes   '[]'$ Weakness or numbness in arm   '[]'$ Weakness or numbness in leg Musculoskeletal:   '[]'$ Joint swelling   '[]'$ Joint pain   '[]'$ Low back pain Hematologic:  '[]'$ Easy bruising  '[]'$ Easy bleeding   '[]'$ Hypercoagulable state   '[]'$ Anemic Gastrointestinal:  '[]'$ Diarrhea   '[]'$ Vomiting  '[]'$ Gastroesophageal reflux/heartburn   '[]'$ Difficulty swallowing. Genitourinary:  '[]'$ Chronic kidney disease   '[]'$ Difficult urination  '[]'$ Frequent urination   '[]'$ Blood in urine Skin:  '[]'$ Rashes   '[]'$ Ulcers  Psychological:  '[]'$ History of anxiety   '[]'$  History of major depression.  Physical Examination  Vitals:   03/23/22 1103  BP: 125/84  Pulse: 93  Resp: 16  Weight: 207 lb (93.9 kg)   Body mass index is 28.15 kg/m. Gen: WD/WN, NAD Head: [place]/AT, No temporalis wasting.  Ear/Nose/Throat: Hearing grossly intact, nares w/o erythema or drainage, pinna without lesions Eyes: PER, EOMI, sclera nonicteric.  Neck: Supple, no gross masses.  No JVD.  Pulmonary:  Good air movement, no audible wheezing, no use of accessory muscles.  Cardiac: RRR, precordium not hyperdynamic. Vascular:  scattered varicosities present bilaterally.  Mild venous stasis changes to the legs bilaterally.  2+ soft pitting edema  Vessel Right Left  Radial Palpable Palpable  Gastrointestinal: soft, non-distended. No guarding/no peritoneal signs.  Musculoskeletal: M/S 5/5 throughout.  No deformity.  Neurologic: CN 2-12 intact. Pain and light touch intact in extremities.  Symmetrical.  Speech is fluent. Motor exam as listed above. Psychiatric: Judgment intact, Mood & affect appropriate for pt's clinical situation. Dermatologic: Venous rashes no ulcers noted.  No changes consistent with cellulitis. Lymph : No lichenification or skin changes of chronic lymphedema.  CBC Lab Results  Component Value Date   WBC 6.7 09/14/2021    HGB 12.0 (L) 09/14/2021   HCT 34.6 (L) 09/14/2021   MCV 90.6 09/14/2021   PLT 180 09/14/2021    BMET    Component Value Date/Time   NA 138 09/14/2021 0150   K 3.3 (L) 09/14/2021 0150   CL 106 09/14/2021 0150   CO2 25 09/14/2021 0150   GLUCOSE 212 (H) 09/14/2021 0150   BUN 14 09/14/2021 0150   CREATININE 0.91 09/14/2021 0150   CALCIUM 7.9 (L) 09/14/2021 0150   GFRNONAA >60 09/14/2021 0150   GFRAA 56 (L) 11/12/2017 1816   CrCl cannot be calculated (Patient's most recent lab result is older than the maximum 21 days allowed.).  COAG Lab Results  Component Value Date   INR 1.2 09/13/2021   INR 1.0 05/14/2021   INR 0.97 05/13/2017    Radiology No results found.   Assessment/Plan 1. 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  IVC filter is not indicated at present.  Patient's duplex ultrasound of the venous system shows resolution of the previous right leg DVT.  The patient is [facility] which [facility] is [facility]  Elevation was stressed, such as the use of a recliner.  I have discussed  DVT and post phlebitic changes such as swelling and why it  causes symptoms such as pain.  The patient should wear graduated compression stockings beginning after three full days of anticoagulation.  The compression should be worn on a daily basis. The patient should wearing the stockings first thing in the morning and removing them in the evening. The patient should not to sleep in the stockings.  In addition, behavioral modification including elevation during the day and avoidance of prolonged dependency will be initiated.    The patient will continue anticoagulation for now as there have not been any problems or complications at this point.   He will follow up with me PRN   2. Essential hypertension Continue antihypertensive medications as already ordered, these medications  have been reviewed and there are no changes at this time.   3. Type 2 diabetes mellitus with hyperlipidemia ([place]) Continue hypoglycemic medications as already ordered, these medications have been reviewed and there are no changes at this time.  Hgb A1C to be monitored as  already arranged by primary service   4. Other hyperlipidemia Continue statin as ordered and reviewed, no changes at this time     Hortencia Pilar, MD  03/26/2022 3:50 PM

## 2022-06-01 ENCOUNTER — Inpatient Hospital Stay
Admission: EM | Admit: 2022-06-01 | Discharge: 2022-06-05 | DRG: 638 | Disposition: A | Discharge status: Home / Self Care | Payer: No Typology Code available for payment source | Attending: Internal Medicine | Admitting: Internal Medicine

## 2022-06-01 ENCOUNTER — Emergency Department: Payer: No Typology Code available for payment source

## 2022-06-01 ENCOUNTER — Encounter: Payer: Self-pay | Admitting: Emergency Medicine

## 2022-06-01 ENCOUNTER — Other Ambulatory Visit: Payer: Self-pay

## 2022-06-01 DIAGNOSIS — Z888 Allergy status to other drugs, medicaments and biological substances status: Secondary | ICD-10-CM | POA: Diagnosis not present

## 2022-06-01 DIAGNOSIS — M109 Gout, unspecified: Secondary | ICD-10-CM | POA: Diagnosis present

## 2022-06-01 DIAGNOSIS — E1140 Type 2 diabetes mellitus with diabetic neuropathy, unspecified: Secondary | ICD-10-CM | POA: Diagnosis present

## 2022-06-01 DIAGNOSIS — R9089 Other abnormal findings on diagnostic imaging of central nervous system: Secondary | ICD-10-CM | POA: Diagnosis present

## 2022-06-01 DIAGNOSIS — M1990 Unspecified osteoarthritis, unspecified site: Secondary | ICD-10-CM | POA: Diagnosis present

## 2022-06-01 DIAGNOSIS — Z882 Allergy status to sulfonamides status: Secondary | ICD-10-CM

## 2022-06-01 DIAGNOSIS — N401 Enlarged prostate with lower urinary tract symptoms: Secondary | ICD-10-CM | POA: Diagnosis present

## 2022-06-01 DIAGNOSIS — R2681 Unsteadiness on feet: Secondary | ICD-10-CM | POA: Diagnosis present

## 2022-06-01 DIAGNOSIS — Z79899 Other long term (current) drug therapy: Secondary | ICD-10-CM

## 2022-06-01 DIAGNOSIS — I10 Essential (primary) hypertension: Secondary | ICD-10-CM | POA: Diagnosis present

## 2022-06-01 DIAGNOSIS — R739 Hyperglycemia, unspecified: Secondary | ICD-10-CM | POA: Diagnosis not present

## 2022-06-01 DIAGNOSIS — I82513 Chronic embolism and thrombosis of femoral vein, bilateral: Secondary | ICD-10-CM | POA: Diagnosis present

## 2022-06-01 DIAGNOSIS — K219 Gastro-esophageal reflux disease without esophagitis: Secondary | ICD-10-CM | POA: Diagnosis present

## 2022-06-01 DIAGNOSIS — I2510 Atherosclerotic heart disease of native coronary artery without angina pectoris: Secondary | ICD-10-CM | POA: Diagnosis present

## 2022-06-01 DIAGNOSIS — H538 Other visual disturbances: Secondary | ICD-10-CM | POA: Diagnosis present

## 2022-06-01 DIAGNOSIS — Z7982 Long term (current) use of aspirin: Secondary | ICD-10-CM

## 2022-06-01 DIAGNOSIS — Z87891 Personal history of nicotine dependence: Secondary | ICD-10-CM | POA: Diagnosis not present

## 2022-06-01 DIAGNOSIS — Z7901 Long term (current) use of anticoagulants: Secondary | ICD-10-CM | POA: Diagnosis not present

## 2022-06-01 DIAGNOSIS — R42 Dizziness and giddiness: Secondary | ICD-10-CM | POA: Diagnosis not present

## 2022-06-01 DIAGNOSIS — E785 Hyperlipidemia, unspecified: Secondary | ICD-10-CM | POA: Diagnosis present

## 2022-06-01 DIAGNOSIS — E1165 Type 2 diabetes mellitus with hyperglycemia: Secondary | ICD-10-CM | POA: Diagnosis not present

## 2022-06-01 DIAGNOSIS — E1169 Type 2 diabetes mellitus with other specified complication: Secondary | ICD-10-CM | POA: Diagnosis present

## 2022-06-01 DIAGNOSIS — Z91040 Latex allergy status: Secondary | ICD-10-CM

## 2022-06-01 DIAGNOSIS — I639 Cerebral infarction, unspecified: Secondary | ICD-10-CM | POA: Diagnosis not present

## 2022-06-01 DIAGNOSIS — Z7984 Long term (current) use of oral hypoglycemic drugs: Secondary | ICD-10-CM

## 2022-06-01 LAB — URINALYSIS, ROUTINE W REFLEX MICROSCOPIC
Bacteria, UA: NONE SEEN
Bilirubin Urine: NEGATIVE
Glucose, UA: >=500 mg/dL — AB
Hgb urine dipstick: NEGATIVE
Ketones, ur: 20 mg/dL — AB
Leukocytes,Ua: NEGATIVE
Nitrite: NEGATIVE
Protein, ur: NEGATIVE mg/dL
Specific Gravity, Urine: 1.03 (ref 1.005–1.030)
Squamous Epithelial / HPF: NONE SEEN (ref 0–5)
pH: 5 (ref 5.0–8.0)

## 2022-06-01 LAB — BASIC METABOLIC PANEL
Anion gap: 13 (ref 5–15)
BUN: 32 mg/dL — ABNORMAL HIGH (ref 8–23)
CO2: 23 mmol/L (ref 22–32)
Calcium: 9.4 mg/dL (ref 8.9–10.3)
Chloride: 96 mmol/L — ABNORMAL LOW (ref 98–111)
Creatinine, Ser: 1.21 mg/dL (ref 0.61–1.24)
GFR, Estimated: >60 mL/min (ref >60)
Glucose, Bld: 490 mg/dL — ABNORMAL HIGH (ref 70–99)
Potassium: 3.9 mmol/L (ref 3.5–5.1)
Sodium: 132 mmol/L — ABNORMAL LOW (ref 135–145)

## 2022-06-01 LAB — CBC
HCT: 47.7 % (ref 39.0–52.0)
Hemoglobin: 16.9 g/dL (ref 13.0–17.0)
MCH: 31.8 pg (ref 26.0–34.0)
MCHC: 35.4 g/dL (ref 30.0–36.0)
MCV: 89.7 fL (ref 80.0–100.0)
Platelets: 185 10*3/uL (ref 150–400)
RBC: 5.32 MIL/uL (ref 4.22–5.81)
RDW: 12 % (ref 11.5–15.5)
WBC: 8.6 10*3/uL (ref 4.0–10.5)
nRBC: 0 % (ref 0.0–0.2)

## 2022-06-01 LAB — HEPATIC FUNCTION PANEL
ALT: 37 U/L (ref 0–44)
AST: 18 U/L (ref 15–41)
Albumin: 3.8 g/dL (ref 3.5–5.0)
Alkaline Phosphatase: 261 U/L — ABNORMAL HIGH (ref 38–126)
Bilirubin, Direct: 0.1 mg/dL (ref 0.0–0.2)
Indirect Bilirubin: 1.1 mg/dL — ABNORMAL HIGH (ref 0.3–0.9)
Total Bilirubin: 1.2 mg/dL (ref 0.3–1.2)
Total Protein: 7 g/dL (ref 6.5–8.1)

## 2022-06-01 LAB — TROPONIN I (HIGH SENSITIVITY)
Troponin I (High Sensitivity): 11 ng/L (ref <18)
Troponin I (High Sensitivity): 11 ng/L (ref <18)

## 2022-06-01 LAB — CBG MONITORING, ED
Glucose-Capillary: 502 mg/dL (ref 70–99)
Glucose-Capillary: 378 mg/dL — ABNORMAL HIGH (ref 70–99)
Glucose-Capillary: 253 mg/dL — ABNORMAL HIGH (ref 70–99)

## 2022-06-01 MED ORDER — INSULIN ASPART 100 UNIT/ML IJ SOLN
10.0000 [IU] | Freq: Once | INTRAMUSCULAR | Status: AC
  Administered 2022-06-01: 10 [IU] via SUBCUTANEOUS
  Filled 2022-06-01: qty 1

## 2022-06-01 MED ORDER — SODIUM CHLORIDE 0.9 % IV BOLUS
1000.0000 mL | Freq: Once | INTRAVENOUS | Status: AC
  Administered 2022-06-01: 1000 mL via INTRAVENOUS

## 2022-06-01 MED ORDER — LACTATED RINGERS IV BOLUS
1000.0000 mL | Freq: Once | INTRAVENOUS | Status: AC
  Administered 2022-06-01: 1000 mL via INTRAVENOUS

## 2022-06-01 MED ORDER — LORAZEPAM 2 MG/ML IJ SOLN
0.5000 mg | Freq: Once | INTRAMUSCULAR | Status: AC
  Administered 2022-06-01: 0.5 mg via INTRAVENOUS
  Filled 2022-06-01: qty 1

## 2022-06-01 NOTE — ED Triage Notes (Signed)
C/o hyperglycemia with fatigue and dizziness x1 month.

## 2022-06-01 NOTE — ED Provider Notes (Signed)
[facility] Provider Note    Event Date/Time   First MD Initiated Contact with Patient 06/01/22 1746     (approximate)   History   Hyperglycemia (C/o hyperglycemia with fatigue and dizziness with fainting and unsteady gait x1 month.)   HPI  Ralph Marsh is a 76 y.o. male with history of CABG x2, diabetes non-insulin-dependent, hyperlipidemia, CHF presents emergency department with dizziness, weakness, increased glucose, increased urinary frequency for 2 days.  States the fatigue and dizziness has been ongoing for about a month.  Worsened over the last 2 days.  States he thinks his glucose is really high.      Physical Exam   Triage Vital Signs: ED Triage Vitals  Enc Vitals Group     BP 06/01/22 1729 119/83     Pulse Rate 06/01/22 1729 (!) 108     Resp 06/01/22 1729 20     Temp 06/01/22 1729 98.2 F (36.8 C)     Temp Source 06/01/22 1729 Oral     SpO2 06/01/22 1729 93 %     Weight 06/01/22 1730 188 lb (85.3 kg)     Height 06/01/22 1730 6' (1.829 m)     Head Circumference --      Peak Flow --      Pain Score 06/01/22 1730 2     Pain Loc --      Pain Edu? --      Excl. in [place]? --     Most recent vital signs: Vitals:   06/01/22 1729  BP: 119/83  Pulse: (!) 108  Resp: 20  Temp: 98.2 F (36.8 C)  SpO2: 93%     General: Awake, no distress.   CV:  Good peripheral perfusion. regular rate and  rhythm Resp:  Normal effort. Lungs CTA Abd:  No distention.   Other:      ED Results / Procedures / Treatments   Labs (all labs ordered are listed, but only abnormal results are displayed) Labs Reviewed  BASIC METABOLIC PANEL - Abnormal; Notable for the following components:      Result Value   Sodium 132 (*)    Chloride 96 (*)    Glucose, Bld 490 (*)    BUN 32 (*)    All other components within normal limits  URINALYSIS, ROUTINE W REFLEX MICROSCOPIC - Abnormal; Notable for the following components:   Color, Urine YELLOW (*)     APPearance HAZY (*)    Glucose, UA >=500 (*)    Ketones, ur 20 (*)    All other components within normal limits  HEPATIC FUNCTION PANEL - Abnormal; Notable for the following components:   Alkaline Phosphatase 261 (*)    Indirect Bilirubin 1.1 (*)    All other components within normal limits  CBG MONITORING, ED - Abnormal; Notable for the following components:   Glucose-Capillary 502 (*)    All other components within normal limits  CBG MONITORING, ED - Abnormal; Notable for the following components:   Glucose-Capillary 378 (*)    All other components within normal limits  CBG MONITORING, ED - Abnormal; Notable for the following components:   Glucose-Capillary 253 (*)    All other components within normal limits  CBC  TROPONIN I (HIGH SENSITIVITY)  TROPONIN I (HIGH SENSITIVITY)     EKG  EKG   RADIOLOGY Chest x-ray, CT of the head    PROCEDURES:   .Critical Care E&M  Performed by: Versie Starks, PA-C Critical care  provider statement:    Critical care time (minutes):  60   Critical care time was exclusive of:  Separately billable procedures and treating other patients   Critical care was necessary to treat or prevent imminent or life-threatening deterioration of the following conditions:  CNS failure or compromise, dehydration and endocrine crisis   Critical care was time spent personally by me on the following activities:  Blood draw for specimens, development of treatment plan with patient or surrogate, evaluation of patient's response to treatment, examination of patient, obtaining history from patient or surrogate, ordering and performing treatments and interventions, ordering and review of laboratory studies, ordering and review of radiographic studies, re-evaluation of patient's condition and review of old charts   Care discussed with: admitting provider   After initial E/M assessment, critical care services were subsequently performed that were exclusive of  separately billable procedures or treatment.      MEDICATIONS ORDERED IN ED: Medications  sodium chloride 0.9 % bolus 1,000 mL (0 mLs Intravenous Stopped 06/01/22 1855)  insulin aspart (novoLOG) injection 10 Units (10 Units Subcutaneous Given 06/01/22 1829)  lactated ringers bolus 1,000 mL (0 mLs Intravenous Stopped 06/01/22 2018)  LORazepam (ATIVAN) injection 0.5 mg (0.5 mg Intravenous Given 06/01/22 2107)  sodium chloride 0.9 % bolus 1,000 mL (1,000 mLs Intravenous New Bag/Given 06/01/22 2039)     IMPRESSION / MDM / ASSESSMENT AND PLAN / ED COURSE  I reviewed the triage vital signs and the nursing notes.                              Differential diagnosis includes, but is not limited to, CVA, subdural, MI, hyperglycemia, DKA  Patient's presentation is most consistent with acute presentation with potential threat to life or bodily function.   Labs and imaging ordered  EKG shows normal sinus rhythm but is altered from EKG 2 years ago.  See physician read   Poc glucose is 500 Labs are reassuring other than the elevated glucose of 490, anion gap is normal, urinalysis shows glucose but no bacteria, hepatic function panel does have an elevated alk phosphatase at 261 with indirect bili is at 1.1.  First troponin is negative.  Due to the patient's dizziness did a CT to evaluate for CVA. CT of the head without contrast was independently reviewed and interpreted by me as being negative  Patient was given 1 L normal saline, 1 L lactated Ringer's, 10 units insulin subcu, after that the glucose did drop to 378 but the patient is not feeling better still having dizziness and blurred vision.  Will order MRI of the brain  MRI of the brain independently reviewed and interpreted by me as having a possible stroke of the left pons.  Radiologist comments that it is a diffuse subtle area that he is concerned about ischemia.  I did discuss findings with the patient.  He is agreeable to admission.   Consult hospitalist  Hospitalist will be admitting the patient.  He is in stable condition at this time  FINAL CLINICAL IMPRESSION(S) / ED DIAGNOSES   Final diagnoses:  Hyperglycemia  Acute CVA (cerebrovascular accident) ([place])  Dizziness     Rx / DC Orders   ED Discharge Orders     None        Note:  This document was prepared using Dragon voice recognition software and may include unintentional dictation errors.    Versie Starks, PA-C 06/01/22 2324  Harvest Dark, MD 06/05/22 1440

## 2022-06-01 NOTE — ED Triage Notes (Signed)
C/o hyperglycemia with fatigue and dizziness with fainting and unsteady gait x1 month.

## 2022-06-01 NOTE — H&P (Signed)
History and Physical   [facility] [place] @ [facility] Admission History and Physical [employer], D.O.    Patient Name: Ralph Marsh MR#: [id number] Date of Birth: [birth year] Date of Admission: 06/01/2022  Referring MD/NP/PA: PA Ashok Cordia Primary Care Physician: Marguerita Merles, MD  Chief Complaint:  Chief Complaint  Patient presents with   Hyperglycemia    C/o hyperglycemia with fatigue and dizziness with fainting and unsteady gait x1 month.    HPI: Ralph Marsh is a 76 y.o. male with a known history of hypertension, diabetes, COPD, GERD, CHF presents to the emergency department for evaluation of hyperglycemia.  Patient was in a usual state of health until 2 days ago when he reports increased urinary frequency associated with dizziness weakness and elevated glucose.  States that he had a transient episode of incoordination of his left hand when holding a fork several weeks ago.  Also reports that he feels unsteady when walking.   Patient denies fevers/chills, chest pain, shortness of breath, N/V/C/D, abdominal pain, dysuria/frequency, changes in mental status.    Otherwise there has been no change in status. Patient has been taking medication as prescribed and there has been no recent change in medication or diet.  No recent antibiotics.  There has been no recent illness, hospitalizations, travel or sick contacts.    EMS/ED Course: Patient received Ativan, insulin, lactated Ringer's and normal saline. Medical admission has been requested for further management of CVA, hyperglycemia.  Review of Systems:  CONSTITUTIONAL: Positive weakness, dizziness no fever/chills, weight gain/loss, headache. EYES: No blurry or double vision. ENT: No tinnitus, postnasal drip, redness or soreness of the oropharynx. RESPIRATORY: No cough, dyspnea, wheeze.  No hemoptysis.  CARDIOVASCULAR: No chest pain, palpitations, syncope, orthopnea. No lower extremity edema.  GASTROINTESTINAL: No  nausea, vomiting, abdominal pain, diarrhea, constipation.  No hematemesis, melena or hematochezia. GENITOURINARY: No dysuria, frequency, hematuria. ENDOCRINE: Positive urinary frequency and hyperglycemia.No heat or cold intolerance. HEMATOLOGY: No anemia, bruising, bleeding. INTEGUMENTARY: No rashes, ulcers, lesions. MUSCULOSKELETAL: No arthritis, gout, dyspnea. NEUROLOGIC: Positive gait ataxia, dizziness. No numbness, tingling, seizure-type activity PSYCHIATRIC: No anxiety, depression, insomnia.   Past Medical History:  Diagnosis Date   Arthritis    BPH (benign prostatic hyperplasia)    Diabetes mellitus, type 2 (HCC)    DVT (deep venous thrombosis) ([place]) 05/12/2017   left upper leg   Emphysema of lung ([place])    mild - on xray   Fatty liver    GERD (gastroesophageal reflux disease)    Hypertension    Kidney stone     Past Surgical History:  Procedure Laterality Date   CATARACT EXTRACTION W/PHACO Right 09/11/2018   Procedure: CATARACT EXTRACTION PHACO AND INTRAOCULAR LENS PLACEMENT ([place])  RIGHT DIABETIC;  Surgeon: Leandrew Koyanagi, MD;  Location: [place];  Service: Ophthalmology;  Laterality: Right;  Diabetic - oral meds Latex sensitiviy   CATARACT EXTRACTION W/PHACO Left 10/09/2018   Procedure: CATARACT EXTRACTION PHACO AND INTRAOCULAR LENS PLACEMENT ([place])  LEFT DIABETIC;  Surgeon: Leandrew Koyanagi, MD;  Location: [facility];  Service: Ophthalmology;  Laterality: Left;  Diabetic - oral meds Latex sensitivity   COLONOSCOPY WITH PROPOFOL N/A 07/28/2019   Procedure: COLONOSCOPY WITH PROPOFOL;  Surgeon: Toledo, Benay Pike, MD;  Location: [facility];  Service: Gastroenterology;  Laterality: N/A;   EXTRACORPOREAL SHOCK WAVE LITHOTRIPSY Left 01/25/2017   Procedure: EXTRACORPOREAL SHOCK WAVE LITHOTRIPSY (ESWL);  Surgeon: Nickie Retort, MD;  Location: [facility];  Service: Urology;  Laterality: Left;   LASER  OF PROSTATE W/ GREEN LIGHT PVP  2008   Dr. Quillian Quince    LEFT HEART CATH AND CORONARY ANGIOGRAPHY N/A 05/16/2021   Procedure: LEFT HEART CATH AND CORONARY ANGIOGRAPHY;  Surgeon: Corey Skains, MD;  Location: [place] [facility];  Service: Cardiovascular;  Laterality: N/A;   LUMBAR LAMINECTOMY     PERIPHERAL VASCULAR THROMBECTOMY Right 09/13/2021   Procedure: PERIPHERAL VASCULAR THROMBECTOMY;  Surgeon: Katha Cabal, MD;  Location: [place] [facility];  Service: Cardiovascular;  Laterality: Right;   TONSILLECTOMY AND ADENOIDECTOMY     URETEROSCOPY WITH HOLMIUM LASER LITHOTRIPSY  2004     reports that he quit smoking about 28 years ago. His smoking use included cigarettes. He has a 15.00 pack-year smoking history. He has never used smokeless tobacco. He reports current alcohol use. He reports that he does not use drugs.  Allergies  Allergen Reactions   Augmentin [Amoxicillin-Pot Clavulanate] Anaphylaxis   Latex Other (See Comments)    Nasal inflammation   Other Anaphylaxis   Solifenacin Other (See Comments)   Sulfa Antibiotics Anaphylaxis   Rosuvastatin     Other reaction(s): Muscle pain Other reaction(s): Muscle pain   Atorvastatin     Other reaction(s): Muscle Pain, Pain in lower limb   Omeprazole     Other reaction(s): Heartburn   Pravastatin     Other reaction(s): Muscle pain   Simvastatin Other (See Comments)    Other reaction(s): Fatigue, Muscle pain Other reaction(s): Fatigue, Muscle pain    Family History  Problem Relation Age of Onset   Kidney disease Neg Hx    Kidney cancer Neg Hx    Prostate cancer Neg Hx     Prior to Admission medications   Medication Sig Start Date End Date Taking? Authorizing Provider  allopurinol (ZYLOPRIM) 300 MG tablet Take 1 tablet by mouth daily. 11/20/18  Yes [provider]  apixaban (ELIQUIS) 5 MG TABS tablet 1 tab ('5mg'$ ) po twice a day 10/10/21  Yes Kris Hartmann, NP  aspirin EC 81 MG tablet Take 81 mg by mouth daily.   Yes [provider]  Cholecalciferol 1000  UNITS tablet Take 1,000 Units by mouth 2 (two) times daily.   Yes [provider]  esomeprazole (NEXIUM) 40 MG capsule Take 1 capsule by mouth daily. 10/24/19  Yes [provider]  Eszopiclone 3 MG TABS Take 3 mg by mouth at bedtime as needed (sleep). 06/07/21  Yes [provider]  furosemide (LASIX) 40 MG tablet Take 40 mg by mouth daily. 08/16/21  Yes [provider]  metFORMIN (GLUCOPHAGE) 1000 MG tablet Take 500 mg by mouth 2 (two) times daily with a meal.   Yes [provider]  metoprolol tartrate (LOPRESSOR) 25 MG tablet Take 0.5 tablets (12.5 mg total) by mouth 2 (two) times daily. Patient taking differently: Take 50 mg by mouth 2 (two) times daily. 05/18/21  Yes Sharen Hones, MD  potassium chloride SA (KLOR-CON) 20 MEQ tablet Take 20 mEq by mouth in the morning. 06/20/21  Yes [provider]  ZETIA 10 MG tablet Take 10 mg by mouth daily. 02/20/22  Yes [provider]  PRALUENT 150 MG/ML SOAJ Inject into the skin. 09/05/21   [provider]    Physical Exam: Vitals:   06/01/22 1729 06/01/22 1730  BP: 119/83   Pulse: (!) 108   Resp: 20   Temp: 98.2 F (36.8 C)   TempSrc: Oral   SpO2: 93%   Weight:  85.3 kg  Height:  6' (1.829 m)    GENERAL: 76 y.o.-year-old white male patient, well-developed, well-nourished lying in the bed in no acute distress.  Pleasant and cooperative.   HEENT: Head atraumatic, normocephalic. Pupils equal. Mucus membranes moist. NECK: Supple. No JVD. No bruits heard.  CHEST: Normal breath sounds bilaterally. No wheezing, rales, rhonchi or crackles. No use of accessory muscles of respiration.  No reproducible chest wall tenderness.  CARDIOVASCULAR: S1, S2 normal. No murmurs, rubs, or gallops. Cap refill <2 seconds. Pulses intact distally.  ABDOMEN: Soft, nondistended, nontender. No rebound, guarding, rigidity. Normoactive bowel sounds present in all four quadrants.  EXTREMITIES: No pedal edema,  cyanosis, or clubbing. No calf tenderness or Homan's sign.  NEUROLOGIC: The patient is alert and oriented x 3. Cranial nerves II through XII are grossly intact with no focal sensorimotor deficit. PSYCHIATRIC:  Normal affect, mood, thought content. SKIN: Warm, dry, and intact without obvious rash, lesion, or ulcer.    Labs on Admission:  CBC: Recent Labs  Lab 06/01/22 1754  WBC 8.6  HGB 16.9  HCT 47.7  MCV 89.7  PLT 466   Basic Metabolic Panel: Recent Labs  Lab 06/01/22 1754  NA 132*  K 3.9  CL 96*  CO2 23  GLUCOSE 490*  BUN 32*  CREATININE 1.21  CALCIUM 9.4   GFR: Estimated Creatinine Clearance: 55.2 mL/min (by C-G formula based on SCr of 1.21 mg/dL). Liver Function Tests: Recent Labs  Lab 06/01/22 1754  AST 18  ALT 37  ALKPHOS 261*  BILITOT 1.2  PROT 7.0  ALBUMIN 3.8   No results for input(s): "LIPASE", "AMYLASE" in the last 168 hours. No results for input(s): "AMMONIA" in the last 168 hours. Coagulation Profile: No results for input(s): "INR", "PROTIME" in the last 168 hours. Cardiac Enzymes: No results for input(s): "CKTOTAL", "CKMB", "CKMBINDEX", "TROPONINI" in the last 168 hours. BNP (last 3 results) No results for input(s): "PROBNP" in the last 8760 hours. HbA1C: No results for input(s): "HGBA1C" in the last 72 hours. CBG: Recent Labs  Lab 06/01/22 1724 06/01/22 2017 06/01/22 2225  GLUCAP 502* 378* 253*   Lipid Profile: No results for input(s): "CHOL", "HDL", "LDLCALC", "TRIG", "CHOLHDL", "LDLDIRECT" in the last 72 hours. Thyroid Function Tests: No results for input(s): "TSH", "T4TOTAL", "FREET4", "T3FREE", "THYROIDAB" in the last 72 hours. Anemia Panel: No results for input(s): "VITAMINB12", "FOLATE", "FERRITIN", "TIBC", "IRON", "RETICCTPCT" in the last 72 hours. Urine analysis:    Component Value Date/Time   COLORURINE YELLOW (A) 06/01/2022 1754   APPEARANCEUR HAZY (A) 06/01/2022 1754   LABSPEC 1.030 06/01/2022 1754   PHURINE 5.0  06/01/2022 1754   GLUCOSEU >=500 (A) 06/01/2022 1754   HGBUR NEGATIVE 06/01/2022 1754   BILIRUBINUR NEGATIVE 06/01/2022 1754   KETONESUR 20 (A) 06/01/2022 1754   PROTEINUR NEGATIVE 06/01/2022 1754   NITRITE NEGATIVE 06/01/2022 1754   LEUKOCYTESUR NEGATIVE 06/01/2022 1754   Sepsis Labs: '@LABRCNTIP'$ (procalcitonin:4,lacticidven:4) )No results found for this or any previous visit (from the past 240 hour(s)).   Radiological Exams on Admission: MR BRAIN WO CONTRAST  Result Date: 06/01/2022 CLINICAL DATA:  Initial evaluation for dizziness. EXAM: MRI HEAD WITHOUT CONTRAST TECHNIQUE: Multiplanar, multiecho pulse sequences of the brain and surrounding structures were obtained without intravenous contrast. COMPARISON:  Prior CT from earlier the same day. FINDINGS: Brain: Generalized age-related cerebral atrophy. Mild for age chronic microvascular ischemic disease seen involving the periventricular white matter. Subtle punctate focus of diffusion signal abnormality seen involving the left pons (series 5, image 16). Given the  history of dizziness, findings suspicious for a tiny acute to early subacute ischemic infarct. No associated hemorrhage. No other evidence for acute or subacute ischemia. Gray-white matter differentiation otherwise maintained. No acute intracranial hemorrhage. Few small chronic micro hemorrhages noted at the superior right cerebellum and right frontal lobe. No mass lesion, midline shift or mass effect. No hydrocephalus or extra-axial fluid collection. Pituitary gland and suprasellar region within normal limits. Vascular: Major intracranial vascular flow voids are maintained. Skull and upper cervical spine: Craniocervical junction normal. Bone marrow signal intensity normal. No scalp soft tissue abnormality. Sinuses/Orbits: Prior bilateral ocular lens replacement. Paranasal sinuses are largely clear. Trace left mastoid effusion noted, of doubtful significance. Other: None. IMPRESSION: 1.  Subtle punctate focus of diffusion signal abnormality involving the left pons. Given the history of dizziness, finding is suspicious for a tiny acute to early subacute ischemic infarct. No associated hemorrhage. 2. No other acute intracranial abnormality. 3. Age-related cerebral atrophy with mild chronic small vessel ischemic disease. Electronically Signed   By: Jeannine Boga M.D.   On: 06/01/2022 23:02   DG Chest 2 View  Result Date: 06/01/2022 CLINICAL DATA:  Hyperglycemia, fatigue, dizziness EXAM: CHEST - 2 VIEW COMPARISON:  05/14/2021 FINDINGS: Lungs are clear.  No pleural effusion or pneumothorax. Heart is normal in size. Postsurgical changes related to prior CABG. Median sternotomy. Visualized osseous structures are within normal limits. IMPRESSION: Normal chest radiographs. Electronically Signed   By: Julian Hy M.D.   On: 06/01/2022 19:01   CT HEAD WO CONTRAST (5MM)  Result Date: 06/01/2022 CLINICAL DATA:  Persistent or recurrent dizziness. Dizziness for 1 month. EXAM: CT HEAD WITHOUT CONTRAST TECHNIQUE: Contiguous axial images were obtained from the base of the skull through the vertex without intravenous contrast. RADIATION DOSE REDUCTION: This exam was performed according to the departmental dose-optimization program which includes automated exposure control, adjustment of the mA and/or kV according to patient size and/or use of iterative reconstruction technique. COMPARISON:  None Available. FINDINGS: Brain: Diffuse cerebral atrophy. Ventricular dilatation consistent with central atrophy. Low-attenuation changes in the deep white matter consistent with small vessel ischemia. No abnormal extra-axial fluid collections. No mass effect or midline shift. Gray-white matter junctions are distinct. Basal cisterns are not effaced. No acute intracranial hemorrhage. Vascular: No hyperdense vessel or unexpected calcification. Skull: Normal. Negative for fracture or focal lesion. Sinuses/Orbits:  No acute finding. Other: None. IMPRESSION: No acute intracranial abnormalities. Chronic atrophy and small vessel ischemic changes. Electronically Signed   By: Lucienne Capers M.D.   On: 06/01/2022 18:48    EKG: Normal sinus rhythm at 100 bpm with normal axis and nonspecific ST-T wave changes.   Assessment/Plan  This is a 76 y.o. male with a history of hypertension, diabetes, COPD, GERD, CHF  now being admitted with:  #. 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 - MRI brain, echo and carotids - Aspirin 325 daily -Fall precautions - Neurochecks - Neuro consult for a.m.  #.  Hyperglycemia without DKA - Accu-Cheks every 4 hours with regular insulin sliding scale coverage  #. H/o Diabetes - Accuchecks q4h with RISS coverage  #. History of gout - Continue Eliquis  #. History of GERD - Continue Protonix  #. History of hypertension - Continue Lasix, metoprolol  #. History of hyperlipidemia - Continue Zetia  Admission status: Inpatient, med telemetry IV Fluids: Hep-Lock Diet/Nutrition: N.p.o. Consults called: Neurology DVT Px: Eliquis SCDs and early ambulation. Code Status: Full Code  Disposition Plan: To home in  1-2 days  All the records are reviewed and case discussed with ED provider. Management plans discussed with the patient and/or family who express understanding and agree with plan of care.  Diarra Kos D.O. on 06/01/2022 at 11:21 PM CC: Primary care physician; Marguerita Merles, MD   06/01/2022, 11:21 PM

## 2022-06-01 NOTE — ED Notes (Signed)
Patient transported to CT 

## 2022-06-02 ENCOUNTER — Inpatient Hospital Stay: Payer: No Typology Code available for payment source

## 2022-06-02 ENCOUNTER — Encounter: Payer: Self-pay | Admitting: Family Medicine

## 2022-06-02 DIAGNOSIS — R42 Dizziness and giddiness: Secondary | ICD-10-CM

## 2022-06-02 DIAGNOSIS — R9089 Other abnormal findings on diagnostic imaging of central nervous system: Secondary | ICD-10-CM | POA: Diagnosis not present

## 2022-06-02 DIAGNOSIS — I639 Cerebral infarction, unspecified: Secondary | ICD-10-CM

## 2022-06-02 DIAGNOSIS — H538 Other visual disturbances: Secondary | ICD-10-CM | POA: Diagnosis present

## 2022-06-02 LAB — GLUCOSE, CAPILLARY
Glucose-Capillary: 279 mg/dL — ABNORMAL HIGH (ref 70–99)
Glucose-Capillary: 311 mg/dL — ABNORMAL HIGH (ref 70–99)
Glucose-Capillary: 270 mg/dL — ABNORMAL HIGH (ref 70–99)
Glucose-Capillary: 304 mg/dL — ABNORMAL HIGH (ref 70–99)
Glucose-Capillary: 324 mg/dL — ABNORMAL HIGH (ref 70–99)
Glucose-Capillary: 328 mg/dL — ABNORMAL HIGH (ref 70–99)

## 2022-06-02 LAB — LIPID PANEL
Cholesterol: 97 mg/dL (ref 0–200)
HDL: 26 mg/dL — ABNORMAL LOW (ref >40)
LDL Cholesterol: UNDETERMINED mg/dL (ref 0–99)
Total CHOL/HDL Ratio: 3.7 RATIO
Triglycerides: 509 mg/dL — ABNORMAL HIGH (ref <150)
VLDL: UNDETERMINED mg/dL (ref 0–40)

## 2022-06-02 LAB — LDL CHOLESTEROL, DIRECT: Direct LDL: 18 mg/dL (ref 0–99)

## 2022-06-02 LAB — HEMOGLOBIN A1C
Hgb A1c MFr Bld: 12.6 % — ABNORMAL HIGH (ref 4.8–5.6)
Mean Plasma Glucose: 314.92 mg/dL

## 2022-06-02 MED ORDER — INSULIN ASPART 100 UNIT/ML IJ SOLN: 0.0000 [IU] | Freq: Three times a day (TID) | INTRAMUSCULAR | Status: DC

## 2022-06-02 MED ORDER — MELATONIN 5 MG PO TABS
5.0000 mg | ORAL_TABLET | Freq: Every day | ORAL | Status: DC
  Filled 2022-06-02 (×2): qty 1

## 2022-06-02 MED ORDER — METOPROLOL TARTRATE 50 MG PO TABS
50.0000 mg | ORAL_TABLET | Freq: Two times a day (BID) | ORAL | Status: DC
  Administered 2022-06-02 – 2022-06-03 (×4): 50 mg via ORAL
  Filled 2022-06-02 (×5): qty 1

## 2022-06-02 MED ORDER — INSULIN GLARGINE-YFGN 100 UNIT/ML ~~LOC~~ SOLN: 8.0000 [IU] | Freq: Every day | SUBCUTANEOUS | Status: DC

## 2022-06-02 MED ORDER — INSULIN GLARGINE 100 UNIT/ML SOLOSTAR PEN: 15.0000 [IU] | PEN_INJECTOR | Freq: Every day | SUBCUTANEOUS | 11 refills | Status: DC

## 2022-06-02 MED ORDER — ASPIRIN 325 MG PO TBEC
325.0000 mg | DELAYED_RELEASE_TABLET | Freq: Every day | ORAL | Status: DC
  Administered 2022-06-02 – 2022-06-05 (×4): 325 mg via ORAL
  Filled 2022-06-02 (×4): qty 1

## 2022-06-02 MED ORDER — ACETAMINOPHEN 160 MG/5ML PO SOLN: 650.0000 mg | ORAL | Status: DC | PRN

## 2022-06-02 MED ORDER — STROKE: EARLY STAGES OF RECOVERY BOOK: Freq: Once | Status: AC

## 2022-06-02 MED ORDER — PEN NEEDLES 32G X 6 MM MISC: 1.0000 | Freq: Three times a day (TID) | 1 refills | Status: DC

## 2022-06-02 MED ORDER — INSULIN ASPART 100 UNIT/ML IJ SOLN
0.0000 [IU] | Freq: Three times a day (TID) | INTRAMUSCULAR | Status: DC
  Administered 2022-06-02: 11 [IU] via SUBCUTANEOUS
  Administered 2022-06-03: 3 [IU] via SUBCUTANEOUS
  Administered 2022-06-03 (×2): 15 [IU] via SUBCUTANEOUS
  Administered 2022-06-03: 11 [IU] via SUBCUTANEOUS
  Administered 2022-06-04: 15 [IU] via SUBCUTANEOUS
  Administered 2022-06-04: 8 [IU] via SUBCUTANEOUS
  Administered 2022-06-04: 5 [IU] via SUBCUTANEOUS
  Administered 2022-06-05 (×2): 3 [IU] via SUBCUTANEOUS
  Filled 2022-06-02 (×10): qty 1

## 2022-06-02 MED ORDER — SODIUM CHLORIDE 0.9 % IV SOLN: INTRAVENOUS | Status: DC

## 2022-06-02 MED ORDER — BLOOD GLUCOSE MONITOR KIT: PACK | 0 refills | Status: DC

## 2022-06-02 MED ORDER — ACETAMINOPHEN 650 MG RE SUPP: 650.0000 mg | RECTAL | Status: DC | PRN

## 2022-06-02 MED ORDER — POTASSIUM CHLORIDE CRYS ER 20 MEQ PO TBCR
20.0000 meq | EXTENDED_RELEASE_TABLET | Freq: Every day | ORAL | Status: DC
  Administered 2022-06-02 – 2022-06-04 (×3): 20 meq via ORAL
  Filled 2022-06-02 (×3): qty 1

## 2022-06-02 MED ORDER — INSULIN GLARGINE-YFGN 100 UNIT/ML ~~LOC~~ SOLN
13.0000 [IU] | Freq: Every day | SUBCUTANEOUS | Status: DC
Start: 2022-06-02 — End: 2022-06-02
  Administered 2022-06-02: 13 [IU] via SUBCUTANEOUS
  Filled 2022-06-02: qty 0.13

## 2022-06-02 MED ORDER — INSULIN GLARGINE-YFGN 100 UNIT/ML ~~LOC~~ SOLN
18.0000 [IU] | Freq: Every day | SUBCUTANEOUS | Status: DC
  Filled 2022-06-02: qty 0.18

## 2022-06-02 MED ORDER — GLUCERNA SHAKE PO LIQD
237.0000 mL | Freq: Three times a day (TID) | ORAL | Status: DC
  Administered 2022-06-02 – 2022-06-05 (×6): 237 mL via ORAL

## 2022-06-02 MED ORDER — VITAMIN D 25 MCG (1000 UNIT) PO TABS
1000.0000 [IU] | ORAL_TABLET | Freq: Two times a day (BID) | ORAL | Status: DC
  Administered 2022-06-02 – 2022-06-05 (×7): 1000 [IU] via ORAL
  Filled 2022-06-02 (×7): qty 1

## 2022-06-02 MED ORDER — ALLOPURINOL 100 MG PO TABS
300.0000 mg | ORAL_TABLET | Freq: Every day | ORAL | Status: DC
  Administered 2022-06-02 – 2022-06-05 (×4): 300 mg via ORAL
  Filled 2022-06-02 (×4): qty 3

## 2022-06-02 MED ORDER — APIXABAN 5 MG PO TABS
5.0000 mg | ORAL_TABLET | Freq: Two times a day (BID) | ORAL | Status: DC
  Administered 2022-06-02 – 2022-06-05 (×7): 5 mg via ORAL
  Filled 2022-06-02 (×7): qty 1

## 2022-06-02 MED ORDER — EZETIMIBE 10 MG PO TABS
10.0000 mg | ORAL_TABLET | Freq: Every day | ORAL | Status: DC
  Administered 2022-06-02 – 2022-06-05 (×4): 10 mg via ORAL
  Filled 2022-06-02 (×5): qty 1

## 2022-06-02 MED ORDER — INSULIN ASPART 100 UNIT/ML IJ SOLN
0.0000 [IU] | INTRAMUSCULAR | Status: DC
  Administered 2022-06-02: 8 [IU] via SUBCUTANEOUS
  Administered 2022-06-02: 11 [IU] via SUBCUTANEOUS
  Administered 2022-06-02: 8 [IU] via SUBCUTANEOUS
  Administered 2022-06-02 (×2): 11 [IU] via SUBCUTANEOUS
  Filled 2022-06-02 (×4): qty 1

## 2022-06-02 MED ORDER — INSULIN GLARGINE-YFGN 100 UNIT/ML ~~LOC~~ SOLN
13.0000 [IU] | Freq: Two times a day (BID) | SUBCUTANEOUS | Status: DC
Start: 2022-06-02 — End: 2022-06-02
  Filled 2022-06-02: qty 0.13

## 2022-06-02 MED ORDER — ACETAMINOPHEN 325 MG PO TABS: 650.0000 mg | ORAL_TABLET | ORAL | Status: DC | PRN

## 2022-06-02 MED ORDER — FUROSEMIDE 40 MG PO TABS
40.0000 mg | ORAL_TABLET | Freq: Every day | ORAL | Status: DC
  Administered 2022-06-02 – 2022-06-05 (×4): 40 mg via ORAL
  Filled 2022-06-02 (×4): qty 1

## 2022-06-02 MED ORDER — SENNOSIDES-DOCUSATE SODIUM 8.6-50 MG PO TABS: 1.0000 | ORAL_TABLET | Freq: Every evening | ORAL | Status: DC | PRN

## 2022-06-02 MED ORDER — IOHEXOL 350 MG/ML SOLN
75.0000 mL | Freq: Once | INTRAVENOUS | Status: AC | PRN
  Administered 2022-06-02: 75 mL via INTRAVENOUS

## 2022-06-02 NOTE — Assessment & Plan Note (Signed)
Followed up with vascular surgery 03/23/2022. U/S then showed resolution of RLE DVT. VSS recommended pt wear graduated compression stockings daily, remove in evening. Continue Eliquis. VSS follow up PRN.

## 2022-06-02 NOTE — Hospital Course (Addendum)
HPI on admission: "Ralph Marsh is a 76 y.o. male with a known history of hypertension, diabetes, COPD, GERD, CHF presents to the emergency department for evaluation of hyperglycemia.  Patient was in a usual state of health until 2 days ago when he reports increased urinary frequency associated with dizziness weakness and elevated glucose.  States that he had a transient episode of incoordination of his left hand when holding a fork several weeks ago.  Also reports that he feels unsteady when walking...."  Patient denied other recent illnesses, medication changes, and reported compliance with medication regimen at home.  Admitted for further evaluation of possible stroke. Neurology was consulted.   See below for evaluation and further hospital course.  Patient noted to have significant hyperglycemia with glucose above 500 in the ED.  Hbg A1c very elevated at 12.6% (average blood glucose 490), up from 6.7% just 8 months ago.  Diabetes coordinator consulted for pt education and to discuss options for therapies with patient and wife.  He hopes to improve without needing to use insulin.

## 2022-06-02 NOTE — Progress Notes (Signed)
   06/02/22 2000  Mobility  HOB Elevated/Bed Position Self regulated  Activity Ambulated independently in hallway  Range of Motion/Exercises Active;All extremities  Level of Assistance Independent  Assistive Device None  Distance Ambulated (ft) 100 ft  Activity Response Tolerated well

## 2022-06-02 NOTE — Assessment & Plan Note (Signed)
Continue home Lasix and metoprolol

## 2022-06-02 NOTE — Assessment & Plan Note (Addendum)
Very poor control recently, A1c is 12.6%, up from 6.7 just 8  Months ago.  Appears outpatient regimen is only metformin.  Initial glucose in the ED was above 500.   Started on sliding scale NovoLog coverage.  Basal insulin added and being up-titrated. Insulin was up-titrated, noted to have significant insulin needs for adequate control.   -- Diabetes coordinator consulted -- Basal insulin glargine 35 units BID -- Also covered with sliding scale NovoLog -- Continue metformin  -- Close outpatient follow-up - has PCP appointment on Monday 7/31 -- Patient agreeable to basal insulin at d/c

## 2022-06-02 NOTE — Evaluation (Signed)
Physical Therapy Evaluation Patient Details Name: Ralph Marsh MRN: [id number] DOB: 1946-05-23 Today's Date: 06/02/2022  History of Present Illness  Pt is a 76 yo male that presented to the ED for increased urinary frequency, dizziness, weakness, elevated glucose. Also noted to have transient episode of L hand weakness, as well as unsteadiness. Imaging stated "Subtle punctate focus of diffusion signal abnormality involving the left pons. Given the history of dizziness, finding is suspicious for a tiny acute to early subacute ischemic infarct." PMH of HTN, DM, COPD, GERD, CHF, CABG.   Clinical Impression  Pt A&Ox4, reported some R hip discomfort (chronic) but did not quantify. The patient stated at baseline he is independent, no falls in the last 6 months.  The patient was able to perform bed mobility independent. Coordination, sensation, and MMT of both UE and LE did not reveal any acute deficits, strength WFLs. He was able to transfer and ambulate independently. DGI performed and did not indicate increased falls risk at this time. The patient demonstrated and reported return to baseline level of functioning, no further acute PT needs indicated. PT to sign off. Please reconsult PT if pt status changes or acute needs are identified.         Recommendations for follow up therapy are one component of a multi-disciplinary discharge planning process, led by the attending physician.  Recommendations may be updated based on patient status, additional functional criteria and insurance authorization.  Follow Up Recommendations No PT follow up      Assistance Recommended at Discharge None  Patient can return home with the following  Other (comment) (NA)    Equipment Recommendations None recommended by PT  Recommendations for Other Services       Functional Status Assessment Patient has not had a recent decline in their functional status     Precautions / Restrictions Precautions Precautions:  Fall Restrictions Weight Bearing Restrictions: No      Mobility  Bed Mobility Overal bed mobility: Independent                  Transfers Overall transfer level: Independent                      Ambulation/Gait Ambulation/Gait assistance: Independent, Supervision Gait Distance (Feet): 170 Feet Assistive device: None         General Gait Details: supervision for balance activities/assessment only  Stairs            Wheelchair Mobility    Modified Rankin (Stroke Patients Only)       Balance Overall balance assessment: Independent                               Standardized Balance Assessment Standardized Balance Assessment : Dynamic Gait Index   Dynamic Gait Index Level Surface: Normal Change in Gait Speed: Normal Gait with Horizontal Head Turns: Mild Impairment Gait with Vertical Head Turns: Mild Impairment Gait and Pivot Turn: Normal Step Over Obstacle: Mild Impairment Step Around Obstacles: Normal Steps: Normal (clinical judgement) Total Score: 21       Pertinent Vitals/Pain Pain Assessment Pain Assessment: No/denies pain (some chronic R hip discomfort, pt did not quantify)    Home Living Family/patient expects to be discharged to:: Private residence Living Arrangements: Spouse/significant other Available Help at Discharge: Family Type of Home: House Home Access: Stairs to enter   Technical brewer of Steps: 1   Home Layout: Two  level;Able to live on main level with bedroom/bathroom   Additional Comments: no using an AD, but reports the [place] sends him stuff that he does not need, did not go into detail    Prior Function Prior Level of Function : Independent/Modified Independent                     Hand Dominance   Dominant Hand: Left    Extremity/Trunk Assessment   Upper Extremity Assessment Upper Extremity Assessment: Overall WFL for tasks assessed;RUE deficits/detail;LUE  deficits/detail RUE Deficits / Details: 5/5 RUE Sensation: WNL RUE Coordination: WNL LUE Deficits / Details: 5/5    Lower Extremity Assessment Lower Extremity Assessment: RLE deficits/detail;LLE deficits/detail;Overall WFL for tasks assessed RLE Deficits / Details: 4+/5 hip flexion and ankle DF, remainder 5/5 RLE Sensation: WNL RLE Coordination: WNL LLE Deficits / Details: 4+/5 hip flexion and ankle DF, remainder 5/5 LLE Sensation: WNL LLE Coordination: WNL       Communication   Communication: No difficulties  Cognition Arousal/Alertness: Awake/alert Behavior During Therapy: WFL for tasks assessed/performed Overall Cognitive Status: Within Functional Limits for tasks assessed                                          General Comments      Exercises     Assessment/Plan    PT Assessment Patient does not need any further PT services  PT Problem List         PT Treatment Interventions Stair training;Functional mobility training;Therapeutic activities;Patient/family education;Neuromuscular re-education;Gait training;Therapeutic exercise;Balance training;DME instruction    PT Goals (Current goals can be found in the Care Plan section)       Frequency       Co-evaluation               AM-PAC PT "6 Clicks" Mobility  Outcome Measure Help needed turning from your back to your side while in a flat bed without using bedrails?: None Help needed moving from lying on your back to sitting on the side of a flat bed without using bedrails?: None Help needed moving to and from a bed to a chair (including a wheelchair)?: None Help needed standing up from a chair using your arms (e.g., wheelchair or bedside chair)?: None Help needed to walk in hospital room?: None Help needed climbing 3-5 steps with a railing? : None 6 Click Score: 24    End of Session Equipment Utilized During Treatment: Gait belt Activity Tolerance: Patient tolerated treatment  well Patient left: in chair;with call bell/phone within reach;with family/visitor present Nurse Communication: Mobility status PT Visit Diagnosis: Other abnormalities of gait and mobility (R26.89)    Time: 8828-0034 PT Time Calculation (min) (ACUTE ONLY): 19 min   Charges:   PT Evaluation $PT Eval Low Complexity: 1 Low PT Treatments $Therapeutic Activity: 8-22 mins        Lieutenant Diego PT, DPT 10:54 AM,06/02/22

## 2022-06-02 NOTE — Progress Notes (Signed)
Progress Note   Patient: LINDA BIEHN DVV:[id number] DOB: 04-05-1946 DOA: 06/01/2022     1 DOS: the patient was seen and examined on 06/02/2022   Brief hospital course: HPI on admission: "Lucious Zou is a 76 y.o. male with a known history of hypertension, diabetes, COPD, GERD, CHF presents to the emergency department for evaluation of hyperglycemia.  Patient was in a usual state of health until 2 days ago when he reports increased urinary frequency associated with dizziness weakness and elevated glucose.  States that he had a transient episode of incoordination of his left hand when holding a fork several weeks ago.  Also reports that he feels unsteady when walking...."  Patient denied other recent illnesses, medication changes, and reported compliance with medication regimen at home.  Admitted for further evaluation of possible stroke. Neurology was consulted.   See below for evaluation and further hospital course.  Patient noted to have significant hyperglycemia with glucose above 500 in the ED.  Hbg A1c very elevated at 12.6% (average blood glucose 490), up from 6.7% just 8 months ago.  Diabetes coordinator consulted for pt education and to discuss options for therapies with patient and wife.  He hopes to improve without needing to use insulin.  Assessment and Plan: * Abnormal brain MRI Presented with dizziness, coordination deficits, blurred vision.  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 His profound hyperglycemia and high A1c are likely etiology of his symptoms. --Neurology consulted --Follow up pending Echo, carotid U/S --Seen by PT/OT/SLP, no rehab needs --Diet resumed --Neuro-checks --Continue ASA and Eliquis, statin --Telemetry --Glycemic control (A1c 12.6%) --Repeat fasting lipid profile in AM, NPO at midnight --No need for permissive HTN  Blurred  vision, bilateral Pt notes progressive worsening of vision recently.  Suspect due to poorly controlled diabetes given glucose > 500 on arrival and A1c of 12.6%. --glycemic control as outlined --ophthalmology follow up outpatient  Dizziness POA, reportedly ongoing for a few week prior to presenting this admission.  Admitted for stroke evaluation, MRI with punctate left pontine defect (acute to subacute), felt unlikely to explain symptoms which are more chronic. Suspect severe hyperglycemic contributing. --stroke evaluation as outlined --glycemic control --check orthostatic vitals   Type 2 diabetes mellitus with hyperlipidemia ([place]) Very poor control recently, A1c is 12.6%, up from 6.7 just 8  Months ago.  Appears outpatient regimen is only metformin.  Initial glucose in the ED was above 500.  Started on sliding scale NovoLog coverage. -- Diabetes coordinator consulted -- Start basal insulin with Semglee 13 units daily -- Continue with moderate sliding scale NovoLog -- Adjust insulin for inpatient goal 140-180 -- Close outpatient follow-up -- Patient advised insulin is the ideal means to improve glycemic control quickly, he would prefer not to use insulin if possible   CAD (coronary artery disease) Stable, no active CP.  Status post CABG x2. Continue ASA, Eliquis, metoprolol, Zetia, Praluent. Stat EKG and troponin if chest pain. Cardiology follow up as scheduled.   Gout Stable, no acute flare symptoms at this time. Continue allopurinol.  Hyperlipidemia Intolerant to statins. Continue Zetia and Praluent. Repeat fasting lipid profile in AM to confirm results (TG's above 500 despite above therapies, LDL could not be calculated). Outpatient follow up.  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 U/S  then showed resolution of RLE DVT. VSS recommended pt wear graduated compression stockings daily, remove in evening. Continue Eliquis. VSS follow up PRN.        Subjective: Patient awake resting in bed, seen with wife at bedside on rounds morning.  He had already seen by PT OT and speech therapy and cleared for any rehab needs.  Diet was resumed.  He reports worsening blurry vision lately over weeks to months.  The dizziness is also been ongoing for at least a few weeks or longer.  We discussed his elevated blood sugars and likely need for insulin.  He would prefer to use different medications if at all possible, not insulin.  Agreeable to diabetes educator to discuss further.  He otherwise has no acute complaints at this time.  Physical Exam: Vitals:   06/02/22 0035 06/02/22 0622 06/02/22 0747 06/02/22 1116  BP: 125/86 103/72 122/83 108/71  Pulse: 90 87 87 79  Resp: '16 16 16 17  '$ Temp: 97.8 F (36.6 C) 97.8 F (36.6 C) 97.8 F (36.6 C) 98 F (36.7 C)  TempSrc:      SpO2: 99% 96% 96% 94%  Weight: 88.6 kg     Height: 6' (1.829 m)      General exam: awake, alert, no acute distress HEENT: moist mucus membranes, hearing grossly normal  Respiratory system: CTAB, no wheezes, rales or rhonchi, normal respiratory effort. Cardiovascular system: normal S1/S2,  RRR, no pedal edema.   Central nervous system: A&O x3. no gross focal neurologic deficits, normal speech Extremities: moves all, no cyanosis, normal tone Skin: dry, intact, normal temperature Psychiatry: normal mood, congruent affect, judgement and insight appear normal   Data Reviewed:  Notable labs -hemoglobin A1c 12.6% . Lipid panel appears erroneous with total cholesterol 97, HDL 26, LDL unable to be calculated, triglycerides 509 despite aggressive therapy for this.  CBGs remain elevated 311, 270, 304. Carotid duplex ultrasounds with no significant stenosis (less than 50% bilaterally)   Family Communication: Wife at  bedside on rounds  Disposition: Status is: Inpatient Remains inpatient appropriate because: Ongoing evaluation not appropriate for the outpatient setting including remainder of stroke evaluation pending, requires further improvement in glycemic control to prevent complications such as DKA or HHS and readmission related to hyperglycemia.   Planned Discharge Destination: Home    Time spent: 45 minutes  Author: Ezekiel Slocumb, DO 06/02/2022 3:12 PM  For on call review [email or web address].

## 2022-06-02 NOTE — Assessment & Plan Note (Addendum)
Pt notes progressive worsening of vision recently.  Suspect due to poorly controlled diabetes given glucose > 500 on arrival and A1c of 12.6%. Vision improved with glycemic control during admission --glycemic control as outlined --ophthalmology follow up outpatient

## 2022-06-02 NOTE — Progress Notes (Signed)
OT Cancellation Note  Patient Details Name: Ralph Marsh MRN: [id number] DOB: 23-Jul-1946   Cancelled Treatment:    Reason Eval/Treat Not Completed: OT screened, no needs identified, will sign off. Order received, chart reviewed. Per pt and PT, pt back to baseline functional independence. No skilled OT needs identified. Will sign off. Please re-consult if additional needs arise.   Dessie Coma, M.S. OTR/L  06/02/22, 10:50 AM  ascom [phone]

## 2022-06-02 NOTE — Assessment & Plan Note (Addendum)
Stable, no active CP.  Status post CABG x2. Continue ASA, Eliquis, metoprolol, Zetia, Praluent. Cardiology follow up as scheduled.

## 2022-06-02 NOTE — Assessment & Plan Note (Signed)
Continue PPI ?

## 2022-06-02 NOTE — Progress Notes (Signed)
Cross Cover CBG remain elevated no 328 - sliding scale insulin changed to [employer]

## 2022-06-02 NOTE — Inpatient Diabetes Management (Addendum)
Inpatient Diabetes Program Recommendations  AACE/ADA: New Consensus Statement on Inpatient Glycemic Control (2015)  Target Ranges:  Prepandial:   less than 140 mg/dL      Peak postprandial:   less than 180 mg/dL (1-2 hours)      Critically ill patients:  140 - 180 mg/dL   Lab Results  Component Value Date   GLUCAP 311 (H) 06/02/2022   HGBA1C 12.6 (H) 06/01/2022    Review of Glycemic Control  Latest Reference Range & Units 06/01/22 17:24 06/01/22 20:17 06/01/22 22:25 06/02/22 02:02 06/02/22 06:25  Glucose-Capillary 70 - 99 mg/dL 502 (HH) 378 (H) 253 (H) 279 (H) 311 (H)  (HH): Data is critically high (H): Data is abnormally high  Diabetes history: DM2 Outpatient Diabetes medications: Metformin 1000 mg BID Current orders for Inpatient glycemic control: Novolog 0-15 units Q4H  Hyperglycemia without DKA  Inpatient Diabetes Program Recommendations:    Please consider:  Semglee 13 units (88.6 kg x 0.15 units) QD  Once eating well please change correction to TID and HS  Will speak with patient today regarding DM management and likely need for insulin at discharge.  Ordered LWWD booklet and insulin starter kit.  Patient has a diet order after passing swallow study.    Addendum @ 1555:  Received referral for DM management and would rather not use insulin.  Spoke with him spouse at bedside.  Reviewed patient's current A1c of 12.6% (average BG of 303 mg/dL).  Explained what a A1c is and what it measures. Also reviewed goal A1c with patient, importance of good glucose control @ home, and blood sugar goals.  He takes Metformin at home.  He only drinks water.  Does not follow a health diet with modified CHOs.    After speaking with they he is agreeable to basal insulin at home.  His wife had a brother who had type 1 DM and is familiar with the insulin pen.    Educated patient on insulin pen use at home. Reviewed contents of insulin flexpen starter kit. Reviewed all steps of insulin pen  including attachment of needle, 2-unit air shot, dialing up dose, giving injection, removing needle, disposal of sharps, storage of unused insulin, disposal of insulin etc. Wife walked through the steps of administering insulin using the pen.   Also reviewed troubleshooting with insulin pen. MD to give patient Rxs for insulin pens and insulin pen needles.  Educated on hypoglycemia, signs, symptoms and treatments.    He gets his medications through the [place].  If DC'd over the weekend can we have Outpatient pharmacy fill basal insulin?  Will [facility] consult.    Educated on The Plate Method, CHO's, portion control, CBGs at home fasting and mid afternoon, F/U with PCP every 3 months, bring meter to PCP office, long and short term complications of uncontrolled BG, and importance of exercise.  Addendum_0 :  Spouse called and has [facility] on the phone.  They will be able to pick up Lantus from them until [place] is able to send to home.    Addendum @ 1733:  Lantus has been delivered to the home by [facility].  Will follow for dose adjustments.    Will continue to follow while inpatient.  Thank you, Reche Dixon, MSN, [place] Diabetes Coordinator Inpatient Diabetes Program [phone] (team pager from 8a-5p)

## 2022-06-02 NOTE — Assessment & Plan Note (Addendum)
Presented with dizziness, coordination deficits, blurred vision.  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 His profound hyperglycemia and high A1c are likely etiology of his symptoms. Unremarkable Echo and Carotid doppler U/S. --Neurology consulted --Seen by PT/OT/SLP, no rehab needs --Diet resumed --Neuro-checks were unremarkable --Continue ASA and Eliquis, statin --Monitored on Telemetry with no significant events --Glycemic control (A1c 12.6%) --No need for permissive HTN

## 2022-06-02 NOTE — Progress Notes (Signed)
Pt passed bedside swallow evaluation. Hospitalist notified.

## 2022-06-02 NOTE — Assessment & Plan Note (Signed)
Intolerant to statins. Continue Zetia and Praluent. Repeat fasting lipid profile in AM to confirm results (TG's above 500 despite above therapies, LDL could not be calculated). Outpatient follow up.

## 2022-06-02 NOTE — Assessment & Plan Note (Addendum)
POA, reportedly ongoing for a few week prior to presenting this admission.  Admitted for stroke evaluation, MRI with punctate left pontine defect (acute to subacute), felt unlikely to explain symptoms which are more chronic. Suspect severe hyperglycemic contributing. --stroke evaluation as outlined --glycemic control --orthostatic vitals normal Dizziness improved with glycemic control

## 2022-06-02 NOTE — Assessment & Plan Note (Signed)
Stable, no acute flare symptoms at this time. Continue allopurinol.

## 2022-06-02 NOTE — Progress Notes (Signed)
SLP Cancellation Note  Patient Details Name: Ralph Marsh MRN: [id number] DOB: [birth year]   Cancelled treatment:       Reason Eval/Treat Not Completed: SLP screened, no needs identified, will sign off (chart reviewed; consulted NSG then met w/ pt and Wife in room) Pt denied any difficulty swallowing and is currently on a regular diet; tolerates swallowing pills w/ water per NSG. Pt had completed eating his lunch meal denying any difficulty w/ swallowing during the meal. Pt conversed in conversation w/out overt expressive/receptive deficits noted; pt denied any speech-language deficits. Speech clear, intelligible.  Pt communicated appropriately during discussion re: his DM; use of Glucerna at home to maintain blood sugars. The Dietician was contacted, and pt spoke w/ her to address Glucerna -- pt/Wife appreciative.  No further skilled ST services indicated as pt appears at his baseline. Pt agreed. NSG to reconsult if any change in status while admitted.        Orinda Kenner, MS, CCC-SLP Speech Language Pathologist Rehab Services; [place] [phone] (ascom) Alphonso Gregson 06/02/2022, 12:18 PM

## 2022-06-02 NOTE — Consult Note (Signed)
Neurology Consultation  Reason for Consult: stroke on MRI - dizziness for weeks Referring Physician: Dr Almyra Free  CC: dizziness   History is obtained from:chart, patient, family at bedside.  HPI: Ralph Marsh is a 76 y.o. male with hypertension, hyperlipidemia-on Praluent anxiety-unable to tolerate statins per outpatient cardiology notes, diabetes, CHF, coronary artery disease, DVT on Eliquis-presented to the ER for evaluation of dizziness, unsteady gait and high blood sugars for the last few days but with no neurological complaints now being present for the past few weeks up to 6 weeks.  Also complained of blurred vision that is been going on for 6 weeks.  Evaluated in the ED.  Found to be hyperglycemic with blood sugar in the 400s. MRI done as a part of work-up for dizziness revealed a punctate infarct in the brainstem-on my review appears incidental but admitted for stroke work-up along with work-up for his hyperglycemia. Had a detailed conversation about any sudden change/sudden onset of focal neurological deficits to which both the patient and family responded in the negative. He says he is feeling better now with sugars well controlled. 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    LKW: Unclear IV thrombolysis given?: no, unclear last known well Premorbid modified Rankin scale (mRS): 2   ROS: Full ROS was performed and is negative except as noted in the HPI  Past Medical History:  Diagnosis Date   Arthritis    BPH (benign prostatic hyperplasia)    Diabetes mellitus, type 2 ([place])    DVT (deep venous thrombosis) ([place]) 05/12/2017   left upper leg   Emphysema of lung ([facility])    mild - on xray   Fatty liver    GERD (gastroesophageal reflux disease)    Hypertension    Kidney stone      Family History  Problem Relation Age of Onset   Kidney disease Neg Hx    Kidney cancer Neg Hx    Prostate cancer Neg Hx    Social History:    reports that he quit smoking about 28 years ago. His smoking use included cigarettes. He has a 15.00 pack-year smoking history. He has never used smokeless tobacco. He reports current alcohol use. He reports that he does not use drugs.  Medications  Current Facility-Administered Medications:    0.9 %  sodium chloride infusion, , Intravenous, Continuous, Hugelmeyer, Alexis, DO, Last Rate: 75 mL/hr at 06/02/22 0102, New Bag at 06/02/22 0102   acetaminophen (TYLENOL) tablet 650 mg, 650 mg, Oral, Q4H PRN **OR** acetaminophen (TYLENOL) 160 MG/5ML solution 650 mg, 650 mg, Per Tube, Q4H PRN **OR** acetaminophen (TYLENOL) suppository 650 mg, 650 mg, Rectal, Q4H PRN, Hugelmeyer, Alexis, DO   allopurinol (ZYLOPRIM) tablet 300 mg, 300 mg, Oral, Daily, Hugelmeyer, Alexis, DO, 300 mg at 06/02/22 0938   apixaban (ELIQUIS) tablet 5 mg, 5 mg, Oral, BID, Hugelmeyer, Alexis, DO, 5 mg at 06/02/22 5188   aspirin EC tablet 325 mg, 325 mg, Oral, Daily, Hugelmeyer, Alexis, DO, 325 mg at 06/02/22 4166   cholecalciferol (VITAMIN D3) 25 MCG (1000 UNIT) tablet 1,000 Units, 1,000 Units, Oral, BID, Hugelmeyer, Alexis, DO, 1,000 Units at 06/02/22 0938   ezetimibe (ZETIA) tablet 10 mg, 10 mg, Oral, Daily, Hugelmeyer, Alexis, DO, 10 mg at 06/02/22 0938   furosemide (LASIX) tablet 40 mg, 40 mg, Oral, Daily, Hugelmeyer, Alexis, DO, 40 mg at 06/02/22 0938   insulin aspart (novoLOG) injection 0-15 Units, 0-15 Units, Subcutaneous, Q4H,  Hugelmeyer, Alexis, DO, 8 Units at 06/02/22 0942   insulin glargine-yfgn (SEMGLEE) injection 13 Units, 13 Units, Subcutaneous, Daily, Ezekiel Slocumb, DO, 13 Units at 06/02/22 0939   metoprolol tartrate (LOPRESSOR) tablet 50 mg, 50 mg, Oral, BID, Hugelmeyer, Alexis, DO, 50 mg at 06/02/22 0630   potassium chloride SA (KLOR-CON M) CR tablet 20 mEq, 20 mEq, Oral, Daily, Hugelmeyer, Alexis, DO, 20 mEq at 06/02/22 1601   senna-docusate (Senokot-S) tablet 1 tablet, 1 tablet, Oral, QHS PRN, Hugelmeyer,  Alexis, DO    Current Meds  Medication Sig   allopurinol (ZYLOPRIM) 300 MG tablet Take 1 tablet by mouth daily.   apixaban (ELIQUIS) 5 MG TABS tablet 1 tab ('5mg'$ ) po twice a day   aspirin EC 81 MG tablet Take 81 mg by mouth daily.   Cholecalciferol 1000 UNITS tablet Take 1,000 Units by mouth 2 (two) times daily.   esomeprazole (NEXIUM) 40 MG capsule Take 1 capsule by mouth daily.   Eszopiclone 3 MG TABS Take 3 mg by mouth at bedtime as needed (sleep).   furosemide (LASIX) 40 MG tablet Take 40 mg by mouth daily.   metFORMIN (GLUCOPHAGE) 1000 MG tablet Take 500 mg by mouth 2 (two) times daily with a meal.   metoprolol tartrate (LOPRESSOR) 25 MG tablet Take 0.5 tablets (12.5 mg total) by mouth 2 (two) times daily. (Patient taking differently: Take 50 mg by mouth 2 (two) times daily.)   potassium chloride SA (KLOR-CON) 20 MEQ tablet Take 20 mEq by mouth in the morning.   ZETIA 10 MG tablet Take 10 mg by mouth daily.  In addition to the above, patient is also on Praluent 150 mg injected every 14 days subcutaneously.   Exam: Current vital signs: BP 122/83 (BP Location: Left Arm)   Pulse 87   Temp 97.8 F (36.6 C)   Resp 16   Ht 6' (1.829 m)   Wt 88.6 kg   SpO2 96%   BMI 26.49 kg/m  Vital signs in last 24 hours: Temp:  [97.8 F (36.6 C)-98.3 F (36.8 C)] 97.8 F (36.6 C) (07/28 0747) Pulse Rate:  [85-108] 87 (07/28 0747) Resp:  [16-20] 16 (07/28 0747) BP: (103-125)/(72-86) 122/83 (07/28 0747) SpO2:  [93 %-99 %] 96 % (07/28 0747) Weight:  [85.3 kg-88.6 kg] 88.6 kg (07/28 0035) General: Awake alert no distress HEENT: Normocephalic atraumatic dry oral mucous membranes Lungs: Clear Cardiovascular: Regular rhythm Abdomen nondistended nontender Extremities warm well perfused Neurological exam Awake alert oriented x3 No evidence of dysarthria and aphasia. Cranial nerve examination with pupils equal round reactive light, extraocular movements intact, visual fields full, facial  symmetry intact, face sensation intact, tongue and palate midline. Motor examination with no drift in any of the 4 extremities. Sensation intact to light touch without extinction Coordination with no dysmetria in upper or lower extremity. - Stroke scale-0.  Labs I have reviewed labs in epic and the results pertinent to this consultation are:  CBC    Component Value Date/Time   WBC 8.6 06/01/2022 1754   RBC 5.32 06/01/2022 1754   HGB 16.9 06/01/2022 1754   HCT 47.7 06/01/2022 1754   PLT 185 06/01/2022 1754   MCV 89.7 06/01/2022 1754   MCH 31.8 06/01/2022 1754   MCHC 35.4 06/01/2022 1754   RDW 12.0 06/01/2022 1754    CMP     Component Value Date/Time   NA 132 (L) 06/01/2022 1754   K 3.9 06/01/2022 1754   CL 96 (L) 06/01/2022 1754  CO2 23 06/01/2022 1754   GLUCOSE 490 (H) 06/01/2022 1754   BUN 32 (H) 06/01/2022 1754   CREATININE 1.21 06/01/2022 1754   CALCIUM 9.4 06/01/2022 1754   PROT 7.0 06/01/2022 1754   ALBUMIN 3.8 06/01/2022 1754   AST 18 06/01/2022 1754   ALT 37 06/01/2022 1754   ALKPHOS 261 (H) 06/01/2022 1754   BILITOT 1.2 06/01/2022 1754   GFRNONAA >60 06/01/2022 1754   GFRAA 56 (L) 11/12/2017 1816    Lipid Panel     Component Value Date/Time   CHOL 97 06/02/2022 0421   TRIG 509 (H) 06/02/2022 0421   HDL 26 (L) 06/02/2022 0421   CHOLHDL 3.7 06/02/2022 0421   VLDL UNABLE TO CALCULATE IF TRIGLYCERIDE OVER 400 mg/dL 06/02/2022 0421   LDLCALC UNABLE TO CALCULATE IF TRIGLYCERIDE OVER 400 mg/dL 06/02/2022 0421   A1c-12.6  Imaging I have reviewed the images obtained:  CT head with no acute changes.  MRI brain with a very subtle punctate focus of restricted diffusion in the left pons-could reflect acute to subacute infarct.  No other acute intracranial abnormality.  Assessment:  76 year old man with extensive cerebrovascular risk factor history, currently on aspirin and Eliquis for CAD and DVT, presenting for evaluation of dizziness that has been ongoing  for few weeks.  Work-up included an MRI that showed a punctate area of restricted diffusion which is very subtle in the left pons.  Admitted for work-up of hyperglycemia as well as the MRI abnormality. In my opinion, this small brainstem area of restricted diffusion likely represents an incidental infarct and I do not think it has any bearing with his presentation because his neurological symptoms have been ongoing for many weeks and this is acute to subacute appearing lesion, not a chronic appearing lesion. With his multiple risk factors, he is always at risk for strokes. I do not disagree with pursuing a extensive risk factor work-up  Impression: Dizziness-likely related to hyperglycemia Acute ischemic stroke-like incidental  Recommendations: -Telemetry monitoring -No need for permissive hypertension -CT Angiogram of Head and neck -Echocardiogram-pending -HgbA1c abnormal-goal less than 7 -Fasting lipid panel shows unable to calculate LDL and triglycerides of 509-we will discuss with primary team if there is any value in repetition.  Currently on Praluent and Zetia-which I will continue and let the outpatient providers work on his hyperlipidemia. -Frequent neuro checks -Prophylactic therapy-continue home Eliquis and aspirin.  The stroke size is extremely small, timing is also unclear and suspicion of this being a true symptomatic stroke is low to begin with. -PT consult, OT consult, Speech consult Plan discussed with Dr. Arbutus Ped. -- Amie Portland, MD Neurologist [facility] Pager: [phone]

## 2022-06-03 ENCOUNTER — Inpatient Hospital Stay
Admit: 2022-06-03 | Discharge: 2022-06-03 | Disposition: A | Discharge status: Home / Self Care | Payer: No Typology Code available for payment source | Attending: Family Medicine | Admitting: Family Medicine

## 2022-06-03 DIAGNOSIS — R9089 Other abnormal findings on diagnostic imaging of central nervous system: Secondary | ICD-10-CM

## 2022-06-03 LAB — LIPID PANEL
Cholesterol: 97 mg/dL (ref 0–200)
HDL: 29 mg/dL — ABNORMAL LOW (ref >40)
LDL Cholesterol: 20 mg/dL (ref 0–99)
Total CHOL/HDL Ratio: 3.3 RATIO
Triglycerides: 239 mg/dL — ABNORMAL HIGH (ref <150)
VLDL: 48 mg/dL — ABNORMAL HIGH (ref 0–40)

## 2022-06-03 LAB — GLUCOSE, CAPILLARY
Glucose-Capillary: 310 mg/dL — ABNORMAL HIGH (ref 70–99)
Glucose-Capillary: 391 mg/dL — ABNORMAL HIGH (ref 70–99)
Glucose-Capillary: 195 mg/dL — ABNORMAL HIGH (ref 70–99)
Glucose-Capillary: 385 mg/dL — ABNORMAL HIGH (ref 70–99)
Glucose-Capillary: 293 mg/dL — ABNORMAL HIGH (ref 70–99)

## 2022-06-03 LAB — BASIC METABOLIC PANEL
Anion gap: 6 (ref 5–15)
BUN: 25 mg/dL — ABNORMAL HIGH (ref 8–23)
CO2: 27 mmol/L (ref 22–32)
Calcium: 8.6 mg/dL — ABNORMAL LOW (ref 8.9–10.3)
Chloride: 105 mmol/L (ref 98–111)
Creatinine, Ser: 1.07 mg/dL (ref 0.61–1.24)
GFR, Estimated: >60 mL/min (ref >60)
Glucose, Bld: 248 mg/dL — ABNORMAL HIGH (ref 70–99)
Potassium: 3.4 mmol/L — ABNORMAL LOW (ref 3.5–5.1)
Sodium: 138 mmol/L (ref 135–145)

## 2022-06-03 LAB — ECHOCARDIOGRAM COMPLETE
AR max vel: 2.86 cm2
AV Peak grad: 4.5 mmHg
Ao pk vel: 1.06 m/s
Area-P 1/2: 3.63 cm2
Calc EF: 67.3 %
Height: 72 in
S' Lateral: 3.57 cm
Single Plane A2C EF: 74.7 %
Single Plane A4C EF: 62.9 %
Weight: 3125.24 oz

## 2022-06-03 LAB — GLUCOSE, RANDOM: Glucose, Bld: 382 mg/dL — ABNORMAL HIGH (ref 70–99)

## 2022-06-03 LAB — MAGNESIUM: Magnesium: 2 mg/dL (ref 1.7–2.4)

## 2022-06-03 MED ORDER — INSULIN GLARGINE 100 UNIT/ML SOLOSTAR PEN: 25.0000 [IU] | PEN_INJECTOR | Freq: Every day | SUBCUTANEOUS | 3 refills | Status: DC

## 2022-06-03 MED ORDER — METOPROLOL TARTRATE 25 MG PO TABS: 50.0000 mg | ORAL_TABLET | Freq: Two times a day (BID) | ORAL | Status: DC

## 2022-06-03 MED ORDER — INSULIN GLARGINE-YFGN 100 UNIT/ML ~~LOC~~ SOLN
20.0000 [IU] | Freq: Two times a day (BID) | SUBCUTANEOUS | Status: DC
Start: 2022-06-03 — End: 2022-06-04
  Administered 2022-06-03: 20 [IU] via SUBCUTANEOUS
  Filled 2022-06-03 (×2): qty 0.2

## 2022-06-03 MED ORDER — BLOOD GLUCOSE MONITOR KIT: PACK | 0 refills | Status: AC

## 2022-06-03 MED ORDER — PERFLUTREN LIPID MICROSPHERE
1.0000 mL | INTRAVENOUS | Status: AC | PRN
  Administered 2022-06-03: 4 mL via INTRAVENOUS

## 2022-06-03 MED ORDER — GLUCERNA SHAKE PO LIQD: 237.0000 mL | Freq: Three times a day (TID) | ORAL | 0 refills | Status: AC

## 2022-06-03 MED ORDER — PEN NEEDLES 32G X 6 MM MISC: 1.0000 | Freq: Three times a day (TID) | 1 refills | Status: AC

## 2022-06-03 MED ORDER — INSULIN GLARGINE-YFGN 100 UNIT/ML ~~LOC~~ SOLN
25.0000 [IU] | Freq: Every day | SUBCUTANEOUS | Status: DC
  Administered 2022-06-03: 25 [IU] via SUBCUTANEOUS
  Filled 2022-06-03: qty 0.25

## 2022-06-03 NOTE — Progress Notes (Signed)
*  PRELIMINARY RESULTS* Echocardiogram 2D Echocardiogram has been performed. Definity IV ultrasound imaging agent used on this study.  Claretta Fraise 06/03/2022, 9:33 AM

## 2022-06-03 NOTE — Plan of Care (Signed)
  Problem: Education: Goal: Ability to describe self-care measures that may prevent or decrease complications (Diabetes Survival Skills Education) will improve Outcome: Progressing Goal: Individualized Educational Video(s) Outcome: Progressing   Problem: Coping: Goal: Ability to adjust to condition or change in health will improve Outcome: Progressing   Problem: Fluid Volume: Goal: Ability to maintain a balanced intake and output will improve Outcome: Progressing   Problem: Health Behavior/Discharge Planning: Goal: Ability to identify and utilize available resources and services will improve Outcome: Progressing Goal: Ability to manage health-related needs will improve Outcome: Progressing   Problem: Metabolic: Goal: Ability to maintain appropriate glucose levels will improve Outcome: Progressing   Problem: Nutritional: Goal: Maintenance of adequate nutrition will improve Outcome: Progressing Goal: Progress toward achieving an optimal weight will improve Outcome: Progressing   Problem: Skin Integrity: Goal: Risk for impaired skin integrity will decrease Outcome: Progressing   Problem: Tissue Perfusion: Goal: Adequacy of tissue perfusion will improve Outcome: Progressing   Problem: Education: Goal: Knowledge of General Education information will improve Description: Including pain rating scale, medication(s)/side effects and non-pharmacologic comfort measures Outcome: Progressing   Problem: Health Behavior/Discharge Planning: Goal: Ability to manage health-related needs will improve Outcome: Progressing   Problem: Clinical Measurements: Goal: Ability to maintain clinical measurements within normal limits will improve Outcome: Progressing Goal: Will remain free from infection Outcome: Progressing Goal: Diagnostic test results will improve Outcome: Progressing Goal: Respiratory complications will improve Outcome: Progressing Goal: Cardiovascular complication will  be avoided Outcome: Progressing   Problem: Activity: Goal: Risk for activity intolerance will decrease Outcome: Progressing   Problem: Nutrition: Goal: Adequate nutrition will be maintained Outcome: Progressing   Problem: Coping: Goal: Level of anxiety will decrease Outcome: Progressing   Problem: Elimination: Goal: Will not experience complications related to bowel motility Outcome: Progressing Goal: Will not experience complications related to urinary retention Outcome: Progressing   Problem: Pain Managment: Goal: General experience of comfort will improve Outcome: Progressing   Problem: Safety: Goal: Ability to remain free from injury will improve Outcome: Progressing   Problem: Skin Integrity: Goal: Risk for impaired skin integrity will decrease Outcome: Progressing   Problem: Education: Goal: Knowledge of disease or condition will improve Outcome: Progressing Goal: Knowledge of secondary prevention will improve (SELECT ALL) Outcome: Progressing Goal: Knowledge of patient specific risk factors will improve (INDIVIDUALIZE FOR PATIENT) Outcome: Progressing Goal: Individualized Educational Video(s) Outcome: Progressing   Problem: Coping: Goal: Will verbalize positive feelings about self Outcome: Progressing Goal: Will identify appropriate support needs Outcome: Progressing   Problem: Health Behavior/Discharge Planning: Goal: Ability to manage health-related needs will improve Outcome: Progressing   Problem: Self-Care: Goal: Ability to participate in self-care as condition permits will improve Outcome: Progressing Goal: Verbalization of feelings and concerns over difficulty with self-care will improve Outcome: Progressing Goal: Ability to communicate needs accurately will improve Outcome: Progressing   Problem: Nutrition: Goal: Risk of aspiration will decrease Outcome: Progressing Goal: Dietary intake will improve Outcome: Progressing   Problem:  Intracerebral Hemorrhage Tissue Perfusion: Goal: Complications of Intracerebral Hemorrhage will be minimized Outcome: Progressing   Problem: Ischemic Stroke/TIA Tissue Perfusion: Goal: Complications of ischemic stroke/TIA will be minimized Outcome: Progressing   Problem: Spontaneous Subarachnoid Hemorrhage Tissue Perfusion: Goal: Complications of Spontaneous Subarachnoid Hemorrhage will be minimized Outcome: Progressing

## 2022-06-03 NOTE — Progress Notes (Signed)
Progress Note   Patient: Ralph Marsh YIR:[id number] DOB: 12-16-1945 DOA: 06/01/2022     2 DOS: the patient was seen and examined on 06/03/2022   Brief hospital course: HPI on admission: "Ralph Marsh is a 76 y.o. male with a known history of hypertension, diabetes, COPD, GERD, CHF presents to the emergency department for evaluation of hyperglycemia.  Patient was in a usual state of health until 2 days ago when he reports increased urinary frequency associated with dizziness weakness and elevated glucose.  States that he had a transient episode of incoordination of his left hand when holding a fork several weeks ago.  Also reports that he feels unsteady when walking...."  Patient denied other recent illnesses, medication changes, and reported compliance with medication regimen at home.  Admitted for further evaluation of possible stroke. Neurology was consulted.   See below for evaluation and further hospital course.  Patient noted to have significant hyperglycemia with glucose above 500 in the ED.  Hbg A1c very elevated at 12.6% (average blood glucose 490), up from 6.7% just 8 months ago.  Diabetes coordinator consulted for pt education and to discuss options for therapies with patient and wife.  He hopes to improve without needing to use insulin.  Assessment and Plan: * Abnormal brain MRI Presented with dizziness, coordination deficits, blurred vision.  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 His profound hyperglycemia and high A1c are likely etiology of his symptoms. Unremarkable Echo and Carotid doppler U/S. --Neurology consulted --Seen by PT/OT/SLP, no rehab needs --Diet resumed --Neuro-checks --Continue ASA and Eliquis, statin --Telemetry --Glycemic control (A1c 12.6%) --No need for permissive HTN  Blurred vision, bilateral Pt notes progressive worsening of  vision recently.  Suspect due to poorly controlled diabetes given glucose > 500 on arrival and A1c of 12.6%. --glycemic control as outlined --ophthalmology follow up outpatient  Dizziness POA, reportedly ongoing for a few week prior to presenting this admission.  Admitted for stroke evaluation, MRI with punctate left pontine defect (acute to subacute), felt unlikely to explain symptoms which are more chronic. Suspect severe hyperglycemic contributing. --stroke evaluation as outlined --glycemic control --check orthostatic vitals   Type 2 diabetes mellitus with hyperlipidemia (Ralph Marsh) Very poor control recently, A1c is 12.6%, up from 6.7 just 8  Months ago.  Appears outpatient regimen is only metformin.  Initial glucose in the ED was above 500.   Started on sliding scale NovoLog coverage.  Insulin use: total 60 units in 24 hours yesterday  -- Diabetes coordinator consulted -- Increase Semglee to 20 units BID -- Continue with moderate sliding scale NovoLog -- Metformin on hold (got IV contrast for CTA) -- Adjust insulin for inpatient goal 140-180 -- Close outpatient follow-up - has PCP appointment on Monday 7/31 -- Patient agreeable to basal insulin at d/c   CAD (coronary artery disease) Stable, no active CP.  Status post CABG x2. Continue ASA, Eliquis, metoprolol, Zetia, Praluent. Stat EKG and troponin if chest pain. Cardiology follow up as scheduled.   Gout Stable, no acute flare symptoms at this time. Continue allopurinol.  Hyperlipidemia Intolerant to statins. Continue Zetia and Praluent. Repeat fasting lipid profile in AM to confirm results (TG's above 500 despite above therapies, LDL could not be calculated). Outpatient follow up.  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 U/S then showed  resolution of RLE DVT. VSS recommended pt wear graduated compression stockings daily, remove in evening. Continue Eliquis. VSS follow up PRN.        Subjective: Patient awake resting in bed, seen with wife at bedside on rounds morning.  His sugars are still high, in 300's but improved from 500's.  He notes some improvement in blurry vision.  Reports feeling well.  Agreeable we need better control of sugars before sending home to avoid coming back with hyperglycemic complciations.  Has PCP appointment for Monday.   Physical Exam: Vitals:   06/03/22 0000 06/03/22 0509 06/03/22 0732 06/03/22 1141  BP: 93/64 112/75 (!) 126/92 123/72  Pulse: 85 73 83 78  Resp: '20 16 18 18  '$ Temp:  98 F (36.7 C) 97.9 F (36.6 C) 99 F (37.2 C)  TempSrc:      SpO2: 97% 96% 98% 96%  Weight:      Height:       General exam: awake, alert, no acute distress HEENT: moist mucus membranes, hearing grossly normal  Respiratory system: on room air, normal respiratory effort. Cardiovascular system: normal S1/S2,  RRR, no pedal edema.   Central nervous system: A&O x3. no gross focal neurologic deficits, normal speech Extremities: moves all, no cyanosis, normal tone Skin: dry, intact, normal temperature Psychiatry: normal mood, congruent affect, judgement and insight appear normal   Data Reviewed:  Notable labs - repeat lipid panel LDL 20, total chol 97, HDL 29, VLDL 48, TG's 239  Potassium 3.4, BUN 25, Ca 8.6  CBG's today 310>> 391    Family Communication: Wife at bedside on rounds  Disposition: Status is: Inpatient Remains inpatient appropriate because: Requires further improvement in glycemic control to prevent complications such as DKA or HHS and readmission related to hyperglycemia.   Planned Discharge Destination: Home    Time spent: 45 minutes  Author: Ezekiel Slocumb, DO 06/03/2022 3:23 PM  For on call review [email or web address].

## 2022-06-03 NOTE — Progress Notes (Addendum)
LDL 20 Would defer to cardiology and PCP to manage A1c goal <7-management per IM as you are  CTA head and neck IMPRESSION: 1. 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 No other hemodynamically significant stenosis in the neck. 2. No intracranial large vessel occlusion. No significant intracranial stenosis.   Echo pending-please call if abnormal.  -- Amie Portland, MD Neurologist [facility] Pager: [phone]

## 2022-06-04 DIAGNOSIS — I10 Essential (primary) hypertension: Secondary | ICD-10-CM | POA: Diagnosis not present

## 2022-06-04 DIAGNOSIS — E785 Hyperlipidemia, unspecified: Secondary | ICD-10-CM

## 2022-06-04 DIAGNOSIS — E1169 Type 2 diabetes mellitus with other specified complication: Secondary | ICD-10-CM | POA: Diagnosis not present

## 2022-06-04 DIAGNOSIS — R9089 Other abnormal findings on diagnostic imaging of central nervous system: Secondary | ICD-10-CM | POA: Diagnosis not present

## 2022-06-04 LAB — GLUCOSE, CAPILLARY
Glucose-Capillary: 108 mg/dL — ABNORMAL HIGH (ref 70–99)
Glucose-Capillary: 139 mg/dL — ABNORMAL HIGH (ref 70–99)
Glucose-Capillary: 213 mg/dL — ABNORMAL HIGH (ref 70–99)
Glucose-Capillary: 234 mg/dL — ABNORMAL HIGH (ref 70–99)
Glucose-Capillary: 237 mg/dL — ABNORMAL HIGH (ref 70–99)
Glucose-Capillary: 240 mg/dL — ABNORMAL HIGH (ref 70–99)
Glucose-Capillary: 266 mg/dL — ABNORMAL HIGH (ref 70–99)
Glucose-Capillary: 280 mg/dL — ABNORMAL HIGH (ref 70–99)
Glucose-Capillary: 293 mg/dL — ABNORMAL HIGH (ref 70–99)
Glucose-Capillary: 375 mg/dL — ABNORMAL HIGH (ref 70–99)
Glucose-Capillary: 391 mg/dL — ABNORMAL HIGH (ref 70–99)
Glucose-Capillary: 465 mg/dL — ABNORMAL HIGH (ref 70–99)

## 2022-06-04 LAB — BASIC METABOLIC PANEL
Anion gap: 6 (ref 5–15)
BUN: 22 mg/dL (ref 8–23)
CO2: 27 mmol/L (ref 22–32)
Calcium: 8.3 mg/dL — ABNORMAL LOW (ref 8.9–10.3)
Chloride: 104 mmol/L (ref 98–111)
Creatinine, Ser: 1.02 mg/dL (ref 0.61–1.24)
GFR, Estimated: >60 mL/min (ref >60)
Glucose, Bld: 255 mg/dL — ABNORMAL HIGH (ref 70–99)
Potassium: 3 mmol/L — ABNORMAL LOW (ref 3.5–5.1)
Sodium: 137 mmol/L (ref 135–145)

## 2022-06-04 MED ORDER — INSULIN GLARGINE-YFGN 100 UNIT/ML ~~LOC~~ SOLN
35.0000 [IU] | Freq: Two times a day (BID) | SUBCUTANEOUS | Status: DC
  Administered 2022-06-04 – 2022-06-05 (×2): 35 [IU] via SUBCUTANEOUS
  Filled 2022-06-04 (×3): qty 0.35

## 2022-06-04 MED ORDER — INSULIN GLARGINE-YFGN 100 UNIT/ML ~~LOC~~ SOLN
30.0000 [IU] | Freq: Two times a day (BID) | SUBCUTANEOUS | Status: DC
Start: 2022-06-04 — End: 2022-06-04
  Administered 2022-06-04: 30 [IU] via SUBCUTANEOUS
  Filled 2022-06-04 (×2): qty 0.3

## 2022-06-04 MED ORDER — INSULIN ASPART 100 UNIT/ML IJ SOLN
20.0000 [IU] | Freq: Once | INTRAMUSCULAR | Status: AC
  Administered 2022-06-04: 20 [IU] via SUBCUTANEOUS
  Filled 2022-06-04: qty 1

## 2022-06-04 MED ORDER — INSULIN GLARGINE 100 UNIT/ML SOLOSTAR PEN: 35.0000 [IU] | PEN_INJECTOR | Freq: Two times a day (BID) | SUBCUTANEOUS | 3 refills | Status: AC

## 2022-06-04 MED ORDER — POTASSIUM CHLORIDE CRYS ER 20 MEQ PO TBCR
40.0000 meq | EXTENDED_RELEASE_TABLET | Freq: Once | ORAL | Status: AC
Start: 2022-06-04 — End: 2022-06-04
  Administered 2022-06-04: 40 meq via ORAL
  Filled 2022-06-04: qty 2

## 2022-06-04 MED ORDER — METOPROLOL TARTRATE 25 MG PO TABS
12.5000 mg | ORAL_TABLET | Freq: Two times a day (BID) | ORAL | Status: DC
  Administered 2022-06-04 – 2022-06-05 (×3): 12.5 mg via ORAL
  Filled 2022-06-04 (×3): qty 1

## 2022-06-04 MED ORDER — INSULIN ASPART 100 UNIT/ML IJ SOLN
8.0000 [IU] | Freq: Three times a day (TID) | INTRAMUSCULAR | Status: DC
Start: 2022-06-04 — End: 2022-06-05
  Administered 2022-06-04 – 2022-06-05 (×3): 8 [IU] via SUBCUTANEOUS
  Filled 2022-06-04 (×3): qty 1

## 2022-06-04 MED ORDER — METOPROLOL TARTRATE 25 MG PO TABS: 12.5000 mg | ORAL_TABLET | Freq: Two times a day (BID) | ORAL | Status: AC

## 2022-06-04 MED ORDER — INSULIN ASPART 100 UNIT/ML IJ SOLN
8.0000 [IU] | Freq: Three times a day (TID) | INTRAMUSCULAR | Status: DC
Start: 2022-06-04 — End: 2022-06-04

## 2022-06-04 MED ORDER — METFORMIN HCL 500 MG PO TABS
1000.0000 mg | ORAL_TABLET | Freq: Two times a day (BID) | ORAL | Status: DC
  Administered 2022-06-04 – 2022-06-05 (×2): 1000 mg via ORAL
  Filled 2022-06-04 (×2): qty 2

## 2022-06-04 MED ORDER — POTASSIUM CHLORIDE CRYS ER 20 MEQ PO TBCR
40.0000 meq | EXTENDED_RELEASE_TABLET | Freq: Every day | ORAL | Status: DC
  Administered 2022-06-05: 40 meq via ORAL
  Filled 2022-06-04: qty 2

## 2022-06-04 NOTE — Plan of Care (Signed)
  Problem: Education: Goal: Ability to describe self-care measures that may prevent or decrease complications (Diabetes Survival Skills Education) will improve Outcome: Progressing   Problem: Coping: Goal: Ability to adjust to condition or change in health will improve Outcome: Progressing   Problem: Activity: Goal: Risk for activity intolerance will decrease Outcome: Adequate for Discharge

## 2022-06-04 NOTE — Discharge Summary (Signed)
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 male with a known history of hypertension, diabetes, COPD, GERD, CHF presents to the emergency department for evaluation of hyperglycemia.  Patient was in a usual state of health until 2 days ago when he reports increased urinary frequency associated with dizziness weakness and elevated glucose.  States that he had a transient episode of incoordination of his left hand when holding a fork several weeks ago.  Also reports that he feels unsteady when walking...."  Patient denied other recent illnesses, medication changes, and reported compliance with medication regimen at home.  Admitted for further evaluation of possible stroke. Neurology was consulted.   See below for evaluation and further hospital course.  Patient noted to have significant hyperglycemia with glucose above 500 in the ED.  Hbg A1c very elevated at 12.6% (average blood glucose 490), up from 6.7% just 8 months ago.  Diabetes coordinator consulted for pt education and to discuss options for therapies with patient and wife.  He hopes to improve without needing to use insulin.  Assessment and Plan: * Abnormal brain MRI Presented with  dizziness, coordination deficits, blurred vision.  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 His profound hyperglycemia and high A1c are likely etiology of his symptoms. Unremarkable Echo and Carotid doppler U/S. --Neurology consulted --Seen by PT/OT/SLP, no rehab needs --Diet resumed --Neuro-checks were unremarkable --Continue ASA and Eliquis, statin --Monitored on Telemetry with no significant events --Glycemic control (A1c 12.6%) --No need for permissive HTN  Blurred vision, bilateral Pt notes progressive worsening of vision recently.  Suspect due to poorly controlled diabetes given glucose > 500 on arrival and A1c of 12.6%. Vision improved with glycemic control during admission --glycemic control as outlined --ophthalmology follow up outpatient  Dizziness POA, reportedly ongoing for a few week prior to presenting this admission.  Admitted for stroke evaluation, MRI with punctate left pontine defect (acute to subacute), felt unlikely to explain symptoms which are more chronic. Suspect severe hyperglycemic contributing. --stroke evaluation as outlined --glycemic control --orthostatic vitals normal Dizziness improved with glycemic control   Type 2 diabetes mellitus with hyperlipidemia (HCC) Very poor control recently, A1c is 12.6%, up from 6.7 just 8  Months ago.  Appears outpatient regimen is only metformin.  Initial glucose in the ED was above 500.   Started on sliding scale NovoLog coverage.  Basal insulin added and being up-titrated. Insulin was up-titrated, noted to have significant insulin needs for adequate control.   -- Diabetes coordinator consulted -- Basal insulin glargine 35 units BID -- Also covered with sliding scale NovoLog --  Continue metformin  -- Close outpatient follow-up - has PCP appointment on Monday 7/31 -- Patient agreeable to basal  insulin at d/c   CAD (coronary artery disease) Stable, no active CP.  Status post CABG x2. Continue ASA, Eliquis, metoprolol, Zetia, Praluent. Cardiology follow up as scheduled.   Gout Stable, no acute flare symptoms at this time. Continue allopurinol.  Hyperlipidemia Intolerant to statins. Continue Zetia and Praluent. Repeat fasting lipid profile in AM to confirm results (TG's above 500 despite above therapies, LDL could not be calculated). Outpatient follow up.  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 U/S then showed resolution of RLE DVT. VSS recommended pt wear graduated compression stockings daily, remove in evening. Continue Eliquis. VSS follow up PRN.         Consultants: none Procedures performed: none  Disposition: Home Diet recommendation:  Cardiac and Carb modified diet DISCHARGE MEDICATION: Allergies as of 06/05/2022       Reactions   Augmentin [amoxicillin-pot Clavulanate] Anaphylaxis   Latex Other (See Comments)   Nasal inflammation   Other Anaphylaxis   Solifenacin Other (See Comments)   Sulfa Antibiotics Anaphylaxis   Rosuvastatin    Other reaction(s): Muscle pain Other reaction(s): Muscle pain   Atorvastatin    Other reaction(s): Muscle Pain, Pain in lower limb   Omeprazole    Other reaction(s): Heartburn   Pravastatin    Other reaction(s): Muscle pain   Simvastatin Other (See Comments)   Other reaction(s): Fatigue, Muscle pain Other reaction(s): Fatigue, Muscle pain        Medication List     TAKE these medications    allopurinol 300 MG tablet Commonly known as: ZYLOPRIM Take 1 tablet by mouth daily.   apixaban 5 MG Tabs tablet Commonly known as: ELIQUIS 1 tab (81m) po twice a day   aspirin EC 81 MG tablet Take 81 mg by mouth daily.   blood  glucose meter kit and supplies Kit Dispense based on patient and insurance preference. Use up to four times daily as directed.   Cholecalciferol 25 MCG (1000 UT) tablet Take 1,000 Units by mouth 2 (two) times daily.   esomeprazole 40 MG capsule Commonly known as: NEXIUM Take 1 capsule by mouth daily.   Eszopiclone 3 MG Tabs Take 3 mg by mouth at bedtime as needed (sleep).   feeding supplement (GLUCERNA SHAKE) Liqd Take 237 mLs by mouth 3 (three) times daily between meals.   furosemide 40 MG tablet Commonly known as: LASIX Take 40 mg by mouth daily.   insulin glargine 100 UNIT/ML Solostar Pen Commonly known as: LANTUS Inject 35 Units into the skin 2 (two) times daily.   metFORMIN 1000 MG tablet Commonly known as: GLUCOPHAGE Take 1 tablet (1,000 mg total) by mouth 2 (two) times daily with a meal. What changed: how much to take   metoprolol tartrate 25 MG tablet Commonly known as: LOPRESSOR Take 0.5 tablets (12.5 mg total) by mouth 2 (two) times daily. What changed: how much to take   Pen Needles 32G X 6 MM Misc 1 each by Does not apply route 4 (four) times daily - after meals and at bedtime.   potassium chloride SA 20 MEQ tablet Commonly known as: KLOR-CON M Take 2 tablets (40 mEq total) by mouth in the morning. What changed: how much to take   Praluent 150 MG/ML Soaj Generic  drug: Alirocumab Inject into the skin.   Zetia 10 MG tablet Generic drug: ezetimibe Take 10 mg by mouth daily.        Follow-up Information     Marguerita Merles, MD. Go on 06/06/2022.   Specialty: Family Medicine Why: '@3' :00pm Contact information: [place] [address] [phone]                Discharge Exam: Filed Weights   06/01/22 1730 06/02/22 0035  Weight: 85.3 kg 88.6 kg   General exam: awake, alert, no acute distress HEENT: atraumatic, clear conjunctiva, anicteric sclera, moist mucus membranes, hearing grossly normal  Respiratory system: CTAB, no  wheezes, rales or rhonchi, normal respiratory effort. Cardiovascular system: normal S1/S2, RRR, no JVD, murmurs, rubs, gallops, no pedal edema.   Gastrointestinal system: soft, NT, ND, no HSM felt, +bowel sounds. Central nervous system: A&O x4. no gross focal neurologic deficits, normal speech Extremities: moves all, no edema, normal tone Skin: dry, intact, normal temperature, normal color,No rashes, lesions or ulcers Psychiatry: normal mood, congruent affect, judgement and insight appear normal   Condition at discharge: stable  The results of significant diagnostics from this hospitalization (including imaging, microbiology, ancillary and laboratory) are listed below for reference.   Imaging Studies: ECHOCARDIOGRAM COMPLETE  Result Date: 06/03/2022    ECHOCARDIOGRAM REPORT   Patient Name:   Ralph Marsh Date of Exam: 06/03/2022 Medical Rec #:  [id number]       Height:       72.0 in Accession #:    [id number]      Weight:       195.3 lb Date of Birth:  1946-06-19        BSA:          2.109 m Patient Age:    33 years        BP:           112/75 mmHg Patient Gender: M               HR:           77 bpm. Exam Location:  [facility] Procedure: 2D Echo and Intracardiac Opacification Agent Indications:     Stroke I63.9  History:         Patient has prior history of Echocardiogram examinations, most                  recent 05/15/2021.  Sonographer:     Kathlen Brunswick RDCS Referring Phys:  [id number] ALEXIS HUGELMEYER Diagnosing Phys: Isaias Cowman MD  Sonographer Comments: Technically difficult study due to poor echo windows, suboptimal subcostal window, suboptimal apical window and suboptimal parasternal window. Image acquisition challenging due to patient body habitus. IMPRESSIONS  1. Left ventricular ejection fraction, by estimation, is 55 to 60%. The left ventricle has normal function. The left ventricle has no regional wall motion abnormalities. Left ventricular diastolic parameters are consistent with  Grade I diastolic dysfunction (impaired relaxation).  2. Right ventricular systolic function is normal. The right ventricular size is normal.  3. The mitral valve is normal in structure. Trivial mitral valve regurgitation. No evidence of mitral stenosis.  4. The aortic valve is normal in structure. Aortic valve regurgitation is not visualized. No aortic stenosis is present.  5. The inferior vena cava is normal in size with greater than 50% respiratory variability, suggesting right atrial pressure of 3 mmHg. FINDINGS  Left Ventricle: Left ventricular ejection fraction, by estimation, is 55 to 60%. The left  ventricle has normal function. The left ventricle has no regional wall motion abnormalities. Definity contrast agent was given IV to delineate the left ventricular  endocardial borders. The left ventricular internal cavity size was normal in size. There is no left ventricular hypertrophy. Left ventricular diastolic parameters are consistent with Grade I diastolic dysfunction (impaired relaxation). Right Ventricle: The right ventricular size is normal. No increase in right ventricular wall thickness. Right ventricular systolic function is normal. Left Atrium: Left atrial size was normal in size. Right Atrium: Right atrial size was normal in size. Pericardium: There is no evidence of pericardial effusion. Mitral Valve: The mitral valve is normal in structure. Trivial mitral valve regurgitation. No evidence of mitral valve stenosis. Tricuspid Valve: The tricuspid valve is normal in structure. Tricuspid valve regurgitation is trivial. No evidence of tricuspid stenosis. Aortic Valve: The aortic valve is normal in structure. Aortic valve regurgitation is not visualized. No aortic stenosis is present. Aortic valve peak gradient measures 4.5 mmHg. Pulmonic Valve: The pulmonic valve was normal in structure. Pulmonic valve regurgitation is not visualized. No evidence of pulmonic stenosis. Aorta: The aortic root is normal in  size and structure. Venous: The inferior vena cava is normal in size with greater than 50% respiratory variability, suggesting right atrial pressure of 3 mmHg. IAS/Shunts: No atrial level shunt detected by color flow Doppler.  LEFT VENTRICLE PLAX 2D LVIDd:         5.16 cm     Diastology LVIDs:         3.57 cm     LV e' medial:    5.66 cm/s LV PW:         1.08 cm     LV E/e' medial:  11.3 LV IVS:        1.15 cm     LV e' lateral:   9.25 cm/s LVOT diam:     2.10 cm     LV E/e' lateral: 6.9 LV SV:         54 LV SV Index:   26 LVOT Area:     3.46 cm  LV Volumes (MOD) LV vol d, MOD A2C: 52.1 ml LV vol d, MOD A4C: 66.6 ml LV vol s, MOD A2C: 13.2 ml LV vol s, MOD A4C: 24.7 ml LV SV MOD A2C:     38.9 ml LV SV MOD A4C:     66.6 ml LV SV MOD BP:      40.8 ml RIGHT VENTRICLE RV Basal diam:  3.33 cm LEFT ATRIUM             Index        RIGHT ATRIUM           Index LA diam:        3.70 cm 1.75 cm/m   RA Area:     10.50 cm LA Vol (A2C):   16.8 ml 7.96 ml/m   RA Volume:   20.80 ml  9.86 ml/m LA Vol (A4C):   25.5 ml 12.09 ml/m LA Biplane Vol: 21.4 ml 10.15 ml/m  AORTIC VALVE AV Area (Vmax): 2.86 cm AV Vmax:        106.00 cm/s AV Peak Grad:   4.5 mmHg LVOT Vmax:      87.50 cm/s LVOT Vmean:     56.200 cm/s LVOT VTI:       0.156 m  AORTA Ao Root diam: 3.70 cm MITRAL VALVE MV Area (PHT): 3.63 cm    SHUNTS MV Decel Time: 209 msec  Systemic VTI:  0.16 m MV E velocity: 63.80 cm/s  Systemic Diam: 2.10 cm MV A velocity: 78.40 cm/s MV E/A ratio:  0.81 Isaias Cowman MD Electronically signed by Isaias Cowman MD Signature Date/Time: 06/03/2022/12:06:22 PM    Final    CT ANGIO HEAD NECK W WO CM  Result Date: 06/02/2022 CLINICAL DATA:  Dizziness, acute infarct on MRI in the left pons EXAM: CT ANGIOGRAPHY HEAD AND NECK TECHNIQUE: Multidetector CT imaging of the head and neck was performed using the standard protocol during bolus administration of intravenous contrast. Multiplanar CT image reconstructions and MIPs were  obtained to evaluate the vascular anatomy. Carotid stenosis measurements (when applicable) are obtained utilizing NASCET criteria, using the distal internal carotid diameter as the denominator. RADIATION DOSE REDUCTION: This exam was performed according to the departmental dose-optimization program which includes automated exposure control, adjustment of the mA and/or kV according to patient size and/or use of iterative reconstruction technique. CONTRAST:  59m OMNIPAQUE IOHEXOL 350 MG/ML SOLN COMPARISON:  06/01/2022 CT head and MRI head FINDINGS: CT HEAD FINDINGS Brain: Subtle hypodensity in the left pons may correlate with the acute/subacute infarcts seen on the 06/01/2022 MRI. No evidence of additional acute infarct, hemorrhage, mass, mass effect, or midline shift. No hydrocephalus or extra-axial fluid collection. Vascular: No hyperdense vessel. Skull: Normal. Negative for fracture or focal lesion. Sinuses/Orbits: No acute finding. Status post bilateral lens replacements. Other: The mastoid air cells are well aerated. CTA NECK FINDINGS Aortic arch: Two-vessel arch with a common origin of the brachiocephalic and left common carotid arteries. Imaged portion shows no evidence of aneurysm or dissection. No significant stenosis of the major arch vessel origins. Aortic atherosclerosis. Right carotid system: No evidence of dissection, occlusion, or hemodynamically significant stenosis (greater than 50%). Atherosclerotic disease at the bifurcation and in the proximal ICA is not hemodynamically significant. Left carotid system: No evidence of dissection, occlusion, or hemodynamically significant stenosis (greater than 50%). Atherosclerotic disease in the proximal ICA is not hemodynamically significant. Vertebral arteries: Compressive stenosis, which appears moderate to severe, of the right vertebral artery secondary to uncovertebral hypertrophy at the level of C5-C6 (series 8, images 130-134 and series 9 images 141-142).  No other hemodynamically significant stenosis. No evidence of dissection or occlusion. Skeleton: No acute osseous abnormality. Degenerative changes in the cervical spine. Impacted right maxillary tooth (series 12, image 92). Other neck: None. Upper chest: No focal pulmonary opacity or pleural effusion. Review of the MIP images confirms the above findings CTA HEAD FINDINGS Anterior circulation: Both internal carotid arteries are patent to the termini, without significant stenosis. A1 segments patent. Normal anterior communicating artery. Anterior cerebral arteries are patent to their distal aspects. No M1 stenosis or occlusion. MCA branches perfused and symmetric. Posterior circulation: Vertebral arteries patent to the vertebrobasilar junction without stenosis. Posterior inferior cerebellar artery visualized on the left but not right. Basilar patent to its distal aspect. Small basilar fenestration proximally. Superior cerebellar arteries patent proximally. Hypoplastic right P1, with a prominent patent right posterior communicating artery, for near fetal origin of the right PCA. The left P1 is patent. Mild narrowing in the right P2 (series 8, image 269). PCAs otherwise perfused to their distal aspects without stenosis. The left posterior communicating artery is patent. Venous sinuses: As permitted by contrast timing, patent. Anatomic variants: Near fetal origin of the right PCA. Review of the MIP images confirms the above findings IMPRESSION: 1. 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  No other hemodynamically significant stenosis in the neck. 2. No intracranial large vessel occlusion. No significant intracranial stenosis. Electronically Signed   By: Merilyn Baba M.D.   On: 06/02/2022 14:19   US Carotid Bilateral (at [facility] and [facility] only)  Result Date: 06/02/2022 CLINICAL DATA:  Hypertension Stroke/TIA Diabetes EXAM: BILATERAL CAROTID DUPLEX  ULTRASOUND TECHNIQUE: Pearline Cables scale imaging, color Doppler and duplex ultrasound were performed of bilateral carotid and vertebral arteries in the neck. COMPARISON:  None available FINDINGS: Criteria: Quantification of carotid stenosis is based on velocity parameters that correlate the residual internal carotid diameter with NASCET-based stenosis levels, using the diameter of the distal internal carotid lumen as the denominator for stenosis measurement. The following velocity measurements were obtained: RIGHT ICA: 118/21 cm/sec CCA: 016/01 cm/sec SYSTOLIC ICA/CCA RATIO:  1.6 ECA: 79 cm/sec LEFT ICA: 42/16 cm/sec CCA: 093/23 cm/sec SYSTOLIC ICA/CCA RATIO:  0.5 ECA: 71 cm/sec RIGHT CAROTID ARTERY: Minimal heterogeneous plaque of the carotid bifurcation. RIGHT VERTEBRAL ARTERY:  Antegrade flow. LEFT CAROTID ARTERY: Mild calcified plaque of the left carotid bulb. LEFT VERTEBRAL ARTERY:  Antegrade flow. IMPRESSION: Less than 50% stenosis of the internal carotid arteries. Electronically Signed   By: Miachel Roux M.D.   On: 06/02/2022 09:17   MR BRAIN WO CONTRAST  Result Date: 06/01/2022 CLINICAL DATA:  Initial evaluation for dizziness. EXAM: MRI HEAD WITHOUT CONTRAST TECHNIQUE: Multiplanar, multiecho pulse sequences of the brain and surrounding structures were obtained without intravenous contrast. COMPARISON:  Prior CT from earlier the same day. FINDINGS: Brain: Generalized age-related cerebral atrophy. Mild for age chronic microvascular ischemic disease seen involving the periventricular white matter. Subtle punctate focus of diffusion signal abnormality seen involving the left pons (series 5, image 16). Given the history of dizziness, findings suspicious for a tiny acute to early subacute ischemic infarct. No associated hemorrhage. No other evidence for acute or subacute ischemia. Gray-white matter differentiation otherwise maintained. No acute intracranial hemorrhage. Few small chronic micro hemorrhages noted at the  superior right cerebellum and right frontal lobe. No mass lesion, midline shift or mass effect. No hydrocephalus or extra-axial fluid collection. Pituitary gland and suprasellar region within normal limits. Vascular: Major intracranial vascular flow voids are maintained. Skull and upper cervical spine: Craniocervical junction normal. Bone marrow signal intensity normal. No scalp soft tissue abnormality. Sinuses/Orbits: Prior bilateral ocular lens replacement. Paranasal sinuses are largely clear. Trace left mastoid effusion noted, of doubtful significance. Other: None. IMPRESSION: 1. Subtle punctate focus of diffusion signal abnormality involving the left pons. Given the history of dizziness, finding is suspicious for a tiny acute to early subacute ischemic infarct. No associated hemorrhage. 2. No other acute intracranial abnormality. 3. Age-related cerebral atrophy with mild chronic small vessel ischemic disease. Electronically Signed   By: Jeannine Boga M.D.   On: 06/01/2022 23:02   DG Chest 2 View  Result Date: 06/01/2022 CLINICAL DATA:  Hyperglycemia, fatigue, dizziness EXAM: CHEST - 2 VIEW COMPARISON:  05/14/2021 FINDINGS: Lungs are clear.  No pleural effusion or pneumothorax. Heart is normal in size. Postsurgical changes related to prior CABG. Median sternotomy. Visualized osseous structures are within normal limits. IMPRESSION: Normal chest radiographs. Electronically Signed   By: Julian Hy M.D.   On: 06/01/2022 19:01   CT HEAD WO CONTRAST (5MM)  Result Date: 06/01/2022 CLINICAL DATA:  Persistent or recurrent dizziness. Dizziness for 1 month. EXAM: CT HEAD WITHOUT CONTRAST TECHNIQUE: Contiguous axial images were obtained from the base of the skull through the vertex without intravenous contrast. RADIATION DOSE REDUCTION: This  exam was performed according to the departmental dose-optimization program which includes automated exposure control, adjustment of the mA and/or kV according to  patient size and/or use of iterative reconstruction technique. COMPARISON:  None Available. FINDINGS: Brain: Diffuse cerebral atrophy. Ventricular dilatation consistent with central atrophy. Low-attenuation changes in the deep white matter consistent with small vessel ischemia. No abnormal extra-axial fluid collections. No mass effect or midline shift. Gray-white matter junctions are distinct. Basal cisterns are not effaced. No acute intracranial hemorrhage. Vascular: No hyperdense vessel or unexpected calcification. Skull: Normal. Negative for fracture or focal lesion. Sinuses/Orbits: No acute finding. Other: None. IMPRESSION: No acute intracranial abnormalities. Chronic atrophy and small vessel ischemic changes. Electronically Signed   By: Lucienne Capers M.D.   On: 06/01/2022 18:48    Microbiology: Results for orders placed or performed during the hospital encounter of 09/12/21  Resp Panel by RT-PCR (Flu A&B, Covid) Nasopharyngeal Swab     Status: None   Collection Time: 09/12/21 11:06 PM   Specimen: Nasopharyngeal Swab; Nasopharyngeal(NP) swabs in vial transport medium  Result Value Ref Range Status   SARS Coronavirus 2 by RT PCR NEGATIVE NEGATIVE Final    Comment: (NOTE) SARS-CoV-2 target nucleic acids are NOT DETECTED.  The SARS-CoV-2 RNA is generally detectable in upper respiratory specimens during the acute phase of infection. The lowest concentration of SARS-CoV-2 viral copies this assay can detect is 138 copies/mL. A negative result does not preclude SARS-Cov-2 infection and should not be used as the sole basis for treatment or other patient management decisions. A negative result may occur with  improper specimen collection/handling, submission of specimen other than nasopharyngeal swab, presence of viral mutation(s) within the areas targeted by this assay, and inadequate number of viral copies(<138 copies/mL). A negative result must be combined with clinical observations, patient  history, and epidemiological information. The expected result is Negative.  Fact Sheet for Patients:  EntrepreneurPulse.com.au  Fact Sheet for Healthcare Providers:  IncredibleEmployment.be  This test is no t yet approved or cleared by the [place] FDA and  has been authorized for detection and/or diagnosis of SARS-CoV-2 by FDA under an Emergency Use Authorization (EUA). This EUA will remain  in effect (meaning this test can be used) for the duration of the COVID-19 declaration under Section 564(b)(1) of the Act, 21 U.S.C.section 360bbb-3(b)(1), unless the authorization is terminated  or revoked sooner.       Influenza A by PCR NEGATIVE NEGATIVE Final   Influenza B by PCR NEGATIVE NEGATIVE Final    Comment: (NOTE) The Xpert Xpress SARS-CoV-2/FLU/RSV plus assay is intended as an aid in the diagnosis of influenza from Nasopharyngeal swab specimens and should not be used as a sole basis for treatment. Nasal washings and aspirates are unacceptable for Xpert Xpress SARS-CoV-2/FLU/RSV testing.  Fact Sheet for Patients: EntrepreneurPulse.com.au  Fact Sheet for Healthcare Providers: IncredibleEmployment.be  This test is not yet approved or cleared by the [place] FDA and has been authorized for detection and/or diagnosis of SARS-CoV-2 by FDA under an Emergency Use Authorization (EUA). This EUA will remain in effect (meaning this test can be used) for the duration of the COVID-19 declaration under Section 564(b)(1) of the Act, 21 U.S.C. section 360bbb-3(b)(1), unless the authorization is terminated or revoked.  Performed at [facility], [address] [place], [place] [address]     Labs: CBC: No results for input(s): "WBC", "NEUTROABS", "HGB", "HCT", "MCV", "PLT" in the last 168 hours.  Basic Metabolic Panel: Recent Labs  Lab 06/03/22 2006  06/04/22 0717 06/05/22 0523  NA  --   137 141  K  --  3.0* 3.9  CL  --  104 105  CO2  --  27 27  GLUCOSE 382* 255* 135*  BUN  --  22 23  CREATININE  --  1.02 1.09  CALCIUM  --  8.3* 8.9   Liver Function Tests: No results for input(s): "AST", "ALT", "ALKPHOS", "BILITOT", "PROT", "ALBUMIN" in the last 168 hours.  CBG: Recent Labs  Lab 06/04/22 2155 06/04/22 2341 06/05/22 0354 06/05/22 0809 06/05/22 1208  GLUCAP 139* 108* 114* 167* 197*    Discharge time spent: less than 30 minutes.  Signed: Ezekiel Slocumb, DO [facility] 06/10/2022

## 2022-06-04 NOTE — Progress Notes (Signed)
Progress Note   Patient: Ralph Marsh VFI:[id number] DOB: [birth year] DOA: 06/01/2022     3 DOS: the patient was seen and examined on 06/04/2022   Brief hospital course: HPI on admission: "Ralph Marsh is a 76 y.o. male with a known history of hypertension, diabetes, COPD, GERD, CHF presents to the emergency department for evaluation of hyperglycemia.  Patient was in a usual state of health until 2 days ago when he reports increased urinary frequency associated with dizziness weakness and elevated glucose.  States that he had a transient episode of incoordination of his left hand when holding a fork several weeks ago.  Also reports that he feels unsteady when walking...."  Patient denied other recent illnesses, medication changes, and reported compliance with medication regimen at home.  Admitted for further evaluation of possible stroke. Neurology was consulted.   See below for evaluation and further hospital course.  Patient noted to have significant hyperglycemia with glucose above 500 in the ED.  Hbg A1c very elevated at 12.6% (average blood glucose 490), up from 6.7% just 8 months ago.  Diabetes coordinator consulted for pt education and to discuss options for therapies with patient and wife.  He hopes to improve without needing to use insulin.  Assessment and Plan: * Abnormal brain MRI Presented with dizziness, coordination deficits, blurred vision.  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 His profound hyperglycemia and high A1c are likely etiology of his symptoms. Unremarkable Echo and Carotid doppler U/S. --Neurology consulted --Seen by PT/OT/SLP, no rehab needs --Diet resumed --Neuro-checks --Continue ASA and Eliquis, statin --Telemetry --Glycemic control (A1c 12.6%) --No need for permissive HTN  Blurred vision, bilateral Pt notes progressive worsening of  vision recently.  Suspect due to poorly controlled diabetes given glucose > 500 on arrival and A1c of 12.6%. --glycemic control as outlined --ophthalmology follow up outpatient  Dizziness POA, reportedly ongoing for a few week prior to presenting this admission.  Admitted for stroke evaluation, MRI with punctate left pontine defect (acute to subacute), felt unlikely to explain symptoms which are more chronic. Suspect severe hyperglycemic contributing. --stroke evaluation as outlined --glycemic control --check orthostatic vitals   Type 2 diabetes mellitus with hyperlipidemia ([place]) Very poor control recently, A1c is 12.6%, up from 6.7 just 8  Months ago.  Appears outpatient regimen is only metformin.  Initial glucose in the ED was above 500.  Started on sliding scale NovoLog coverage.  Basal insulin added and being up-titrated.  7/30: Several CBGs in the 200s but this afternoon are above 400 again  Insulin requirement yesterday: Total 89 units  -- Diabetes coordinator consulted -- Increase Semglee to 35 units BID -- Continue with moderate sliding scale NovoLog -- Resume metformin  -- Adjust insulin for inpatient goal 140-180 -- Close outpatient follow-up - has PCP appointment on Monday 7/31 -- Patient agreeable to basal insulin at d/c   CAD (coronary artery disease) Stable, no active CP.  Status post CABG x2. Continue ASA, Eliquis, metoprolol, Zetia, Praluent. Stat EKG and troponin if chest pain. Cardiology follow up as scheduled.   Gout Stable, no acute flare symptoms at this time. Continue allopurinol.  Hyperlipidemia Intolerant to statins. Continue Zetia and Praluent. Repeat fasting lipid profile in AM to confirm results (TG's above 500 despite above therapies, LDL could not be calculated). Outpatient follow up.  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 U/S then showed resolution of RLE DVT. VSS recommended pt wear graduated compression stockings daily, remove in evening. Continue Eliquis. VSS follow up PRN.        Subjective: Patient awake resting in bed, seen with wife at bedside on rounds morning.  He has been up ambulating on the unit reports overall feeling well.  He reports less urinary frequency and improvement in his vision since admission and blood sugars are improved.  Hope to discharge home this afternoon but blood sugars trended upwards and again above 400 this afternoon.  Physical Exam: Vitals:   06/04/22 0005 06/04/22 0437 06/04/22 0755 06/04/22 1225  BP: 99/67 100/68 114/81 108/80  Pulse: 74 86 92 84  Resp: '16 16 14 18  '$ Temp: 98 F (36.7 C) 97.9 F (36.6 C) 98.1 F (36.7 C) 97.8 F (36.6 C)  TempSrc:      SpO2: 93% 97% 94% 96%  Weight:      Height:       General exam: awake, alert, no acute distress HEENT: moist mucus membranes, hearing grossly normal  Respiratory system: on room air, normal respiratory effort. Cardiovascular system: normal S1/S2,  RRR, no pedal edema.   Central nervous system: A&O x3. no gross focal neurologic deficits, normal speech Extremities: moves all, no cyanosis, normal tone Skin: dry, intact, normal temperature Psychiatry: normal mood, congruent affect, judgement and insight appear normal   Data Reviewed:  Notable labs  Potassium 3.0, glucose 255, calcium 8.3  CBG's since yesterday evening: 293 >> 280 >> 234 >> 266 this morning >> 391 >> 375 >> 465 at around 2 PM today    Family Communication: Wife at bedside on rounds  Disposition: Status is: Inpatient Remains inpatient appropriate because: Requires further improvement in glycemic control to prevent complications such as DKA or HHS and readmission related to hyperglycemia.   Planned Discharge Destination: Home    Time spent: 45  minutes  Author: Ezekiel Slocumb, DO 06/04/2022 2:38 PM  For on call review [email or web address].

## 2022-06-04 NOTE — Progress Notes (Signed)
       CROSS COVER NOTE  NAME: Ralph Marsh MRN: [id number] DOB : 04-19-1946    Date of Service   06/04/22  HPI/Events of Note   Patient requesting to shower. Last PT note 7/28 notes patient return to baseline and ability to ambulate independently. Due to blurry vision, dizziness, and unsteady gait listed in attending note patient may shower with standby assist.  Interventions   Plan: Pt may shower with standby assist        This document was prepared using Dragon voice recognition software and may include unintentional dictation errors.  Neomia Glass DNP, MHA, FNP-BC Nurse Practitioner [facility] Pager [phone]

## 2022-06-05 LAB — BASIC METABOLIC PANEL
Anion gap: 9 (ref 5–15)
BUN: 23 mg/dL (ref 8–23)
CO2: 27 mmol/L (ref 22–32)
Calcium: 8.9 mg/dL (ref 8.9–10.3)
Chloride: 105 mmol/L (ref 98–111)
Creatinine, Ser: 1.09 mg/dL (ref 0.61–1.24)
GFR, Estimated: >60 mL/min (ref >60)
Glucose, Bld: 135 mg/dL — ABNORMAL HIGH (ref 70–99)
Potassium: 3.9 mmol/L (ref 3.5–5.1)
Sodium: 141 mmol/L (ref 135–145)

## 2022-06-05 LAB — GLUCOSE, CAPILLARY
Glucose-Capillary: 114 mg/dL — ABNORMAL HIGH (ref 70–99)
Glucose-Capillary: 167 mg/dL — ABNORMAL HIGH (ref 70–99)
Glucose-Capillary: 197 mg/dL — ABNORMAL HIGH (ref 70–99)

## 2022-06-05 MED ORDER — METFORMIN HCL 1000 MG PO TABS: 1000.0000 mg | ORAL_TABLET | Freq: Two times a day (BID) | ORAL | 1 refills | Status: AC

## 2022-06-05 MED ORDER — POTASSIUM CHLORIDE CRYS ER 20 MEQ PO TBCR: 40.0000 meq | EXTENDED_RELEASE_TABLET | Freq: Every morning | ORAL | 1 refills | Status: AC

## 2022-06-05 NOTE — Progress Notes (Signed)
Mobility Specialist - Progress Note    06/05/22 1015  Mobility  Activity Ambulated independently in hallway;Stood at bedside;Dangled on edge of bed  Level of Assistance Independent  Assistive Device None  Distance Ambulated (ft) 160 ft  Activity Response Tolerated well  $Mobility charge 1 Mobility   Pt supine in bed on RA upon arrival. Pt STS and ambulates 1 lap around NS indep. Pt returned to bed with needs in reach.   Gretchen Short  Mobility Specialist  06/05/22 10:16 AM

## 2022-06-05 NOTE — Plan of Care (Signed)
  RD consulted for nutrition education regarding diabetes.   Lab Results  Component Value Date   HGBA1C 12.6 (H) 06/01/2022   Spoke with pt and wife at bedside. Pt has had DM for over 15 years. Pt shares that it is typically well controlled, but stopped checking CBGS per the guidance of his PCP. He started to feel poorly PTA; pt wife shares that Hgb A1c has risen from 6 to 12 over the past 8 months. He will be discharging home on insulin; he feels comfortable doing this, as he has given himself injections before.   Most of education was spent on importance of self-management.   Informed MD and RN that education was completed.   RD provided "Carbohydrate Counting for People with Diabetes" handout from the Academy of Nutrition and Dietetics. Discussed different food groups and their effects on blood sugar, emphasizing carbohydrate-containing foods. Provided list of carbohydrates and recommended serving sizes of common foods.  Discussed importance of controlled and consistent carbohydrate intake throughout the day. Provided examples of ways to balance meals/snacks and encouraged intake of high-fiber, whole grain complex carbohydrates. Teach back method used.  Expect good compliance.  Current diet order is carb modified, patient is consuming approximately 100% of meals at this time. Labs and medications reviewed. No further nutrition interventions warranted at this time. RD contact information provided. If additional nutrition issues arise, please re-consult RD.  Loistine Chance, RD, LDN, [place] Registered Dietitian II Certified Diabetes Care and Education Specialist Please refer to [facility] for RD and/or RD on-call/weekend/after hours pager

## 2022-06-05 NOTE — Discharge Instructions (Signed)

## 2022-06-05 NOTE — Progress Notes (Signed)
Patient being discharged home. IV removed. Went over discharge instructions and medications with patient and patients wife. All questions were answered and they stated they understood. Patient is going home POV with wife.

## 2022-06-05 NOTE — Progress Notes (Signed)
  Transition of Care [facility]) Screening Note   Patient Details  Name: Ralph Marsh Date of Birth: 07/15/1946   Transition of Care [facility]) CM/SW Contact:    Alberteen Sam, LCSW Phone Number: 06/05/2022, 10:47 AM    Transition of Care Department [facility]) has reviewed patient and no TOC needs have been identified at this time. We will continue to monitor patient advancement through interdisciplinary progression rounds. If new patient transition needs arise, please place a TOC consult.  [place], [place]

## 2022-06-06 ENCOUNTER — Other Ambulatory Visit: Payer: Self-pay | Admitting: Family Medicine

## 2022-06-06 DIAGNOSIS — E119 Type 2 diabetes mellitus without complications: Secondary | ICD-10-CM

## 2022-06-06 DIAGNOSIS — R5383 Other fatigue: Secondary | ICD-10-CM

## 2022-06-15 ENCOUNTER — Ambulatory Visit
Admission: RE | Admit: 2022-06-15 | Discharge: 2022-06-15 | Disposition: A | Discharge status: Home / Self Care | Payer: Medicare HMO | Source: Ambulatory Visit | Attending: Family Medicine | Admitting: Family Medicine

## 2022-06-15 DIAGNOSIS — R5383 Other fatigue: Secondary | ICD-10-CM | POA: Diagnosis present

## 2022-06-15 DIAGNOSIS — E119 Type 2 diabetes mellitus without complications: Secondary | ICD-10-CM | POA: Diagnosis present

## 2022-06-27 ENCOUNTER — Ambulatory Visit: Payer: No Typology Code available for payment source | Admitting: *Deleted

## 2022-09-04 ENCOUNTER — Encounter (INDEPENDENT_AMBULATORY_CARE_PROVIDER_SITE_OTHER): Payer: Self-pay

## 2023-02-01 ENCOUNTER — Ambulatory Visit: Payer: Medicare PPO | Admitting: Urology

## 2023-02-20 ENCOUNTER — Ambulatory Visit: Payer: Medicare HMO | Admitting: Urology

## 2023-02-20 ENCOUNTER — Encounter: Payer: Self-pay | Admitting: Urology

## 2023-02-20 VITALS — BP 131/78 | HR 97 | Ht 72.0 in | Wt 207.0 lb

## 2023-02-20 DIAGNOSIS — Z87442 Personal history of urinary calculi: Secondary | ICD-10-CM | POA: Diagnosis not present

## 2023-02-20 DIAGNOSIS — R350 Frequency of micturition: Secondary | ICD-10-CM

## 2023-02-20 DIAGNOSIS — Z125 Encounter for screening for malignant neoplasm of prostate: Secondary | ICD-10-CM | POA: Diagnosis not present

## 2023-02-20 DIAGNOSIS — N200 Calculus of kidney: Secondary | ICD-10-CM

## 2023-02-20 DIAGNOSIS — R399 Unspecified symptoms and signs involving the genitourinary system: Secondary | ICD-10-CM

## 2023-02-20 NOTE — Progress Notes (Signed)
02/20/23 1:12 PM   Ralph Marsh 1945/12/29 [id number]  CC: Nephrolithiasis, PSA screening, lower urinary tract symptoms  HPI: 77 year old male transferring his care from [facility] urology for the above issues.  PSA has ranged from 1-4 in the last 10 years, with most recent PSA normal at 2.79 from April 2023 which was essentially stable from 1.8 in 2021 and 2019.  He had a prostate MRI in 2016 that showed a 20 g prostate with no suspicious lesions.  Apparently he was told he had a DNA test at some point in [place] that suggested he was at higher risk for aggressive prostate cancer, but none of those records are available to me.  He and his wife are fairly adamant about continuing PSA screening despite the guidelines that do not recommend routine screening in men over age 39.  He also has a history of recurrent nephrolithiasis, previously has required shockwave lithotripsy and ureteroscopy.  Most recent stone intervention was May 2022 with ureteroscopy.  He has not tolerated stents well in the past.  Finally has a history of overactive urinary symptoms, and previously was trialed on both anticholinergics and beta 3 agonist with no significant improvement in symptoms.  Currently denies any significant urinary symptoms aside from frequency, however he is consuming a large volume of fluid during the day, and taking Lasix in the morning.  He is not significantly bothered by his urinary symptoms at this time.  He also has a string of a greenlight laser PVP in 2008.  PVR today normal at 50ml.   PMH: Past Medical History:  Diagnosis Date   Arthritis    BPH (benign prostatic hyperplasia)    Diabetes mellitus, type 2 (HCC)    DVT (deep venous thrombosis) (HCC) 05/12/2017   left upper leg   Emphysema of lung (HCC)    mild - on xray   Fatty liver    GERD (gastroesophageal reflux disease)    Hypertension    Kidney stone     Surgical History: Past Surgical History:  Procedure Laterality Date    CATARACT EXTRACTION W/PHACO Right 09/11/2018   Procedure: CATARACT EXTRACTION PHACO AND INTRAOCULAR LENS PLACEMENT (IOC)  RIGHT DIABETIC;  Surgeon: Lockie Mola, MD;  Location: [facility];  Service: Ophthalmology;  Laterality: Right;  Diabetic - oral meds Latex sensitiviy   CATARACT EXTRACTION W/PHACO Left 10/09/2018   Procedure: CATARACT EXTRACTION PHACO AND INTRAOCULAR LENS PLACEMENT (IOC)  LEFT DIABETIC;  Surgeon: Lockie Mola, MD;  Location: [facility] SUR[facility]ERY [facility]TR;  Service: Ophthalmology;  Laterality: Left;  Diabetic - oral meds Latex sensitivity   COLONOSCOPY WITH PROPOFOL N/A 07/28/2019   Procedure: COLONOSCOPY WITH PROPOFOL;  Surgeon: Toledo, Boykin Nearing, MD;  Location: [facility] ENDOSCOPY;  Service: Gastroenterology;  Laterality: N/A;   EXTRACORPOREAL SHOCK WAVE LITHOTRIPSY Left 01/25/2017   Procedure: EXTRACORPOREAL SHOCK WAVE LITHOTRIPSY (ESWL);  Surgeon: Hildred Laser, MD;  Location: [facility] ORS;  Service: Urology;  Laterality: Left;   LASER OF PROSTATE W/ GREEN LIGHT PVP  2008   Dr. Reuel Boom   LEFT HEART CATH AND CORONARY ANGIOGRAPHY N/A 05/16/2021   Procedure: LEFT HEART CATH AND CORONARY ANGIOGRAPHY;  Surgeon: Lamar Blinks, MD;  Location: [facility] INVASIVE CV LAB;  Service: Cardiovascular;  Laterality: N/A;   LUMBAR LAMINECTOMY     PERIPHERAL VASCULAR THROMBECTOMY Right 09/13/2021   Procedure: PERIPHERAL VASCULAR THROMBECTOMY;  Surgeon: Renford Dills, MD;  Location: [facility] INVASIVE CV LAB;  Service: Cardiovascular;  Laterality: Right;   TONSILLECTOMY AND ADENOIDECTOMY  URETEROSCOPY WITH HOLMIUM LASER LITHOTRIPSY  2004     Family History: Family History  Problem Relation Age of Onset   Kidney disease Neg Hx    Kidney cancer Neg Hx    Prostate cancer Neg Hx     Social History:  reports that he quit smoking about 29 years ago. His smoking use included cigarettes. He has a 15.00 pack-year smoking history. He has never used smokeless tobacco. He reports  current alcohol use. He reports that he does not use drugs.  Physical Exam: BP 131/78   Pulse 97   Ht 6' (1.829 m)   Wt 207 lb (93.9 kg)   BMI 28.07 kg/m    Constitutional:  Alert and oriented, No acute distress. Cardiovascular: No clubbing, cyanosis, or edema. Respiratory: Normal respiratory effort, no increased work of breathing. GI: Abdomen is soft, nontender, nondistended, no abdominal masses  Laboratory Data: Reviewed, see HPI  Pertinent Imaging: I personally viewed and interpreted the most recent CT from May 2022 with small nonobstructing left renal stones, right proximal ureteral stones that have since been treated with ureteroscopy at [facility]  Assessment & Plan:   77 year old male transferring his care from [facility] for PSA screening, history of nephrolithiasis, and urinary symptoms.  Outside records from [facility] were reviewed extensively.  Regarding his PSA values, reassurance was provided regarding history of normal and stable PSA values, most recent normal PSA of 2.79, normal prostate MRI from 2016.  They are both fairly adamant about continuing PSA screening, and we reviewed risks and benefits extensively as well as the AUA guidelines that do not recommend routine screening in men over age 24.  He denies any stone episodes in the last 2 years, stone prevention strategies discussed.  Regarding his urinary symptoms, these are stable and not particularly bothersome, we reviewed avoiding bladder irritants and minimizing fluid prior to bedtime.  RTC 1 year KUB prior, PVR   Legrand Rams, MD 02/20/2023  [facility] [address], [address] [place], [place] [address] [phone]

## 2023-02-20 NOTE — Patient Instructions (Signed)

## 2024-02-18 ENCOUNTER — Ambulatory Visit
Admission: RE | Admit: 2024-02-18 | Discharge: 2024-02-18 | Disposition: A | Discharge status: Home / Self Care | Attending: Urology | Admitting: Urology

## 2024-02-18 ENCOUNTER — Other Ambulatory Visit
Admission: RE | Admit: 2024-02-18 | Discharge: 2024-02-18 | Disposition: A | Discharge status: Home / Self Care | Source: Home / Self Care | Attending: Urology | Admitting: Urology

## 2024-02-18 ENCOUNTER — Ambulatory Visit
Admission: RE | Admit: 2024-02-18 | Discharge: 2024-02-18 | Disposition: A | Discharge status: Home / Self Care | Source: Ambulatory Visit | Attending: Urology | Admitting: Urology

## 2024-02-18 DIAGNOSIS — N200 Calculus of kidney: Secondary | ICD-10-CM

## 2024-02-18 DIAGNOSIS — Z125 Encounter for screening for malignant neoplasm of prostate: Secondary | ICD-10-CM | POA: Diagnosis present

## 2024-02-19 ENCOUNTER — Encounter: Payer: Self-pay | Admitting: Urology

## 2024-02-19 ENCOUNTER — Ambulatory Visit: Payer: Self-pay | Admitting: Urology

## 2024-02-19 VITALS — BP 116/72 | Ht 72.0 in | Wt 202.0 lb

## 2024-02-19 DIAGNOSIS — R399 Unspecified symptoms and signs involving the genitourinary system: Secondary | ICD-10-CM | POA: Diagnosis not present

## 2024-02-19 DIAGNOSIS — N200 Calculus of kidney: Secondary | ICD-10-CM

## 2024-02-19 DIAGNOSIS — Z125 Encounter for screening for malignant neoplasm of prostate: Secondary | ICD-10-CM | POA: Diagnosis not present

## 2024-02-19 LAB — PSA (REFLEX TO FREE) (SERIAL): Prostate Specific Ag, Serum: 3 ng/mL (ref 0.0–4.0)

## 2024-02-19 NOTE — Patient Instructions (Signed)
 Prostate Cancer Screening  Prostate cancer screening is testing that is done to check for the presence of prostate cancer in men. The prostate gland is a walnut-sized gland that is located below the bladder and in front of the rectum in males. The function of the prostate is to add fluid to semen during ejaculation. Prostate cancer is one of the most common types of cancer in men. Who should have prostate cancer screening? Screening recommendations vary based on age and other risk factors, as well as between the professional organizations who make the recommendations. In general, screening is recommended if: You are age 80 to 80 and have an average risk for prostate cancer. You should talk with your health care provider about your need for screening and how often screening should be done. Because most prostate cancers are slow growing and will not cause death, screening in this age group is generally reserved for men who have a 10- to 15-year life expectancy. You are younger than age 80, and you have these risk factors: Having a father, brother, or uncle who has been diagnosed with prostate cancer. The risk is higher if your family member's cancer occurred at an early age or if you have multiple family members with prostate cancer at an early age. Being a male who is [place] or is of [place] or sub-Saharan African descent. In general, screening is not recommended if: You are younger than age 80. You are between the ages of 93 and 30 and you have no risk factors. You are 80 years of age or older. At this age, the risks that screening can cause are greater than the benefits that it may provide. If you are at high risk for prostate cancer, your health care provider may recommend that you have screenings more often or that you start screening at a younger age. How is screening for prostate cancer done? The recommended prostate cancer screening test is a blood test called the prostate-specific antigen  (PSA) test. PSA is a protein that is made in the prostate. As you age, your prostate naturally produces more PSA. Abnormally high PSA levels may be caused by: Prostate cancer. An enlarged prostate that is not caused by cancer (benign prostatic hyperplasia, or BPH). This condition is very common in older men. A prostate gland infection (prostatitis) or urinary tract infection. Certain medicines such as male hormones (like testosterone) or other medicines that raise testosterone levels. A rectal exam may be done as part of prostate cancer screening to help provide information about the size of your prostate gland. When a rectal exam is performed, it should be done after the PSA level is drawn to avoid any effect on the results. Depending on the PSA results, you may need more tests, such as: A physical exam to check the size of your prostate gland, if not done as part of screening. Blood and imaging tests. A procedure to remove tissue samples from your prostate gland for testing (biopsy). This is the only way to know for certain if you have prostate cancer. What are the benefits of prostate cancer screening? Screening can help to identify cancer at an early stage, before symptoms start and when the cancer can be treated more easily. There is a small chance that screening may lower your risk of dying from prostate cancer. The chance is small because prostate cancer is a slow-growing cancer, and most men with prostate cancer die from a different cause. What are the risks of prostate cancer screening? The  main risk of prostate cancer screening is diagnosing and treating prostate cancer that would never have caused any symptoms or problems. This is called overdiagnosisand overtreatment. PSA screening cannot tell you if your PSA is high due to cancer or a different cause. A prostate biopsy is the only procedure to diagnose prostate cancer. Even the results of a biopsy may not tell you if your cancer needs to  be treated. Slow-growing prostate cancer may not need any treatment other than monitoring, so diagnosing and treating it may cause unnecessary stress or other side effects. Questions to ask your health care provider When should I start prostate cancer screening? What is my risk for prostate cancer? How often do I need screening? What type of screening tests do I need? How do I get my test results? What do my results mean? Do I need treatment? Where to find more information The American Cancer Society: www.cancer.org American Urological Association: www.auanet.org Contact a health care provider if: You have difficulty urinating. You have pain when you urinate or ejaculate. You have blood in your urine or semen. You have pain in your back or in the area of your prostate. Summary Prostate cancer is a common type of cancer in men. The prostate gland is located below the bladder and in front of the rectum. This gland adds fluid to semen during ejaculation. Prostate cancer screening may identify cancer at an early stage, when the cancer can be treated more easily and is less likely to have spread to other areas of the body. The prostate-specific antigen (PSA) test is the recommended screening test for prostate cancer, but it has associated risks. Discuss the risks and benefits of prostate cancer screening with your health care provider. If you are age 80 or older, the risks that screening can cause are greater than the benefits that it may provide. This information is not intended to replace advice given to you by your health care provider. Make sure you discuss any questions you have with your health care provider. Document Revised: 04/18/2021 Document Reviewed: 04/18/2021 Elsevier Patient Education  2024 [employer].

## 2024-02-19 NOTE — Progress Notes (Signed)
   02/19/2024 1:19 PM   Ralph Marsh 01/31/1946 [id number]  Reason for visit: Follow up PSA screening, history of nephrolithiasis, urinary symptoms  HPI: 78 year old male transferred his care from [facility] urology in April 2024.  PSA has ranged from 1-4 in the last 10 years, prostate MRI in 2016 was benign and showed a 20 g prostate.  Apparently he was told at some point in [place] that he had a DNA test that showed he was high risk for prostate cancer, those records are not available to me, and family is fairly adamant about continuing PSA screening despite guideline recommendations not recommending routine screening in men over age 44.  He also has a history of nephrolithiasis, has required shockwave and ureteroscopy in the past, has not done well with stents previously.  Finally, history of overactive urinary symptoms, previously trialed on anticholinergics and beta 3 agonist with no significant improvement.  History of a greenlight laser PVP in 2008.  PVRs have been normal.  Today he denies any changes over the last year.  Specifically no stone events or worsening of urinary symptoms.  He has some frequency during the day when he takes his Lasix, nocturia 1 time overnight.  Overall minimally bothered by urinary symptoms at this time.  I personally viewed and interpreted the KUB today that shows no evidence of nephrolithiasis, multiple stable gallstones.  PSA April 2025 normal at 3.0 and stable from 2.8 last year.  Risks and benefits of screening reviewed again, and they would like to continue yearly PSA monitoring.  RTC 1 year PSA reflex to free prior, KUB, PVR  Lawerence Pressman, MD  [facility] [address] [place], [place] [address] [phone]

## 2024-07-08 ENCOUNTER — Encounter: Payer: Self-pay | Admitting: Urology

## 2024-08-27 ENCOUNTER — Other Ambulatory Visit: Payer: Self-pay

## 2024-08-27 ENCOUNTER — Emergency Department
Admission: EM | Admit: 2024-08-27 | Discharge: 2024-08-27 | Disposition: A | Discharge status: Home / Self Care | Attending: Emergency Medicine | Admitting: Emergency Medicine

## 2024-08-27 ENCOUNTER — Emergency Department

## 2024-08-27 DIAGNOSIS — M79604 Pain in right leg: Secondary | ICD-10-CM | POA: Diagnosis present

## 2024-08-27 DIAGNOSIS — Z7901 Long term (current) use of anticoagulants: Secondary | ICD-10-CM | POA: Diagnosis not present

## 2024-08-27 DIAGNOSIS — E119 Type 2 diabetes mellitus without complications: Secondary | ICD-10-CM | POA: Diagnosis not present

## 2024-08-27 DIAGNOSIS — M7121 Synovial cyst of popliteal space [Baker], right knee: Secondary | ICD-10-CM | POA: Insufficient documentation

## 2024-08-27 DIAGNOSIS — I10 Essential (primary) hypertension: Secondary | ICD-10-CM | POA: Diagnosis not present

## 2024-08-27 MED ORDER — OXYCODONE-ACETAMINOPHEN 5-325 MG PO TABS
1.0000 | ORAL_TABLET | Freq: Once | ORAL | Status: AC
  Administered 2024-08-27: 1 via ORAL
  Filled 2024-08-27: qty 1

## 2024-08-27 MED ORDER — OXYCODONE-ACETAMINOPHEN 7.5-325 MG PO TABS: 1.0000 | ORAL_TABLET | ORAL | 0 refills | Status: AC | PRN

## 2024-08-27 NOTE — ED Notes (Signed)
 Patient ambulated with steady gait using walker with no complications.

## 2024-08-27 NOTE — Discharge Instructions (Addendum)
 Your ultrasound today did not show a DVT.  We did discover a Baker's cyst which we suspect is contributing to your pain.  Apply ice to the affected area, keep the affected leg elevated.   please follow-up with your primary care provider in 1 week for further management and to discuss options for physical therapy referral.

## 2024-08-27 NOTE — ED Triage Notes (Signed)
 Pt arrives via POV with a possible DVT in the right leg. Pt received a synvisc one injection in both knees which they have been receiving every 6 months for 14 years. When they have had the pain in the past it normally goes away in 3-4 days and this time it hasn't. Pt states that they pain starts in their hips and goes down the right leg. Pt is A&Ox4 during triage.

## 2024-08-27 NOTE — ED Provider Notes (Signed)
 [facility] Emergency Department Provider Note     Event Date/Time   First MD Initiated Contact with Patient 08/27/24 1549     (approximate)   History   possible DVT   HPI  Ralph Marsh is a 78 y.o. male with a past medical history of HTN, diabetes, GERD and DVT presents to the ED sent by the [facility] for for possible right lower extremity DVT.  Patient reports he recently received a synvisc injection in bilateral knees on 10/14.  Since he reports pain behind the right knee with radiation to sending to his ankle.  Intermittently the pain will radiate to his hip down the lateral portion of his leg.  Also notes he feels a bulge behind his right knee.  Patient reports he is unable to walk due to this pain.  He is on Eliquis .  Denies chest pain or shortness of breath.  Denies recent travel, or surgeries.  Last DVT in 2022.    Physical Exam   Triage Vital Signs: ED Triage Vitals  Encounter Vitals Group     BP 08/27/24 1427 (!) 127/95     Girls Systolic BP Percentile --      Girls Diastolic BP Percentile --      Boys Systolic BP Percentile --      Boys Diastolic BP Percentile --      Pulse Rate 08/27/24 1427 95     Resp 08/27/24 1427 16     Temp 08/27/24 1427 98.2 F (36.8 C)     Temp Source 08/27/24 1427 Oral     SpO2 08/27/24 1427 96 %     Weight 08/27/24 1433 203 lb (92.1 kg)     Height 08/27/24 1433 6' 1 (1.854 m)     Head Circumference --      Peak Flow --      Pain Score 08/27/24 1428 5     Pain Loc --      Pain Education --      Exclude from Growth Chart --     Most recent vital signs: Vitals:   08/27/24 1427  BP: (!) 127/95  Pulse: 95  Resp: 16  Temp: 98.2 F (36.8 C)  SpO2: 96%    General Awake, no distress.  HEENT NCAT.  CV:  Good peripheral perfusion.  RESP:  Normal effort.  ABD:  No distention.  Other:  Right LE reveals no edema or pitting edema. Noted redness to medial malleolus. Pedal pulses palpated 2+ bilaterally  symmetric. Good and brisk cap refill. Tenderness with calf squeeze and palpation along IT band. Tenderness to popliteal fossa with no noted bulge or mass.    ED Results / Procedures / Treatments   Labs (all labs ordered are listed, but only abnormal results are displayed) Labs Reviewed - No data to display  RADIOLOGY  I personally viewed and evaluated these images as part of my medical decision making, as well as reviewing the written report by the radiologist.  US  Venous Img Lower Unilateral Right Result Date: 08/27/2024 CLINICAL DATA:  Right leg pain.  Concern for DVT. EXAM: Right LOWER EXTREMITY VENOUS DOPPLER ULTRASOUND TECHNIQUE: Gray-scale sonography with compression, as well as color and duplex ultrasound, were performed to evaluate the deep venous system(s) from the level of the common femoral vein through the popliteal and proximal calf veins. COMPARISON:  None Available. FINDINGS: VENOUS Normal compressibility of the common femoral, superficial femoral, and popliteal veins, as well as the visualized calf  veins. Visualized portions of profunda femoral vein and great saphenous vein unremarkable. No filling defects to suggest DVT on grayscale or color Doppler imaging. Doppler waveforms show normal direction of venous flow, normal respiratory plasticity and response to augmentation. Limited views of the contralateral common femoral vein are unremarkable. OTHER There is a 3.0 x 2.3 x 4.8 cm posterior popliteal cyst. Limitations: none IMPRESSION: 1. No evidence of DVT. 2. Baker's cyst. Electronically Signed   By: Vanetta Chou M.D.   On: 08/27/2024 17:05    PROCEDURES:  Critical Care performed: No  Procedures   MEDICATIONS ORDERED IN ED: Medications  oxyCODONE -acetaminophen  (PERCOCET/ROXICET) 5-325 MG per tablet 1 tablet (1 tablet Oral Given 08/27/24 1749)     IMPRESSION / MDM / ASSESSMENT AND PLAN / ED COURSE  I reviewed the triage vital signs and the nursing notes.                                78 y.o. male presents to the emergency department for evaluation and treatment of right knee/calf/thigh pain. See HPI for further details.   Differential diagnosis includes, but is not limited to DVT, popliteal cyst, thrombophlebitis, ITBS  Patient's presentation is most consistent with acute complicated illness / injury requiring diagnostic workup.  Patient is alert and oriented.  He is hemodynamically stable.  Physical exam findings are as stated above.  Ultrasound rules out DVT.  Does show popliteal cyst which I suspect is causing this patient's pain.  Gait assessed and patient is able to ambulate with the assistance of a walker.  Discussed follow-up appointment with primary care to discuss options with physical therapy.  RICE therapy education provided.  He is agreement with this care plan.  Short course of Percocets sent to pharmacy for breakthrough pain.  Patient stable condition for discharge home.  ED return precaution discussed.  FINAL CLINICAL IMPRESSION(S) / ED DIAGNOSES   Final diagnoses:  Right leg pain  Synovial cyst of right popliteal space   Rx / DC Orders   ED Discharge Orders          Ordered    oxyCODONE -acetaminophen  (PERCOCET) 7.5-325 MG tablet  Every 4 hours PRN        08/27/24 1848             Note:  This document was prepared using Dragon voice recognition software and may include unintentional dictation errors.    Margrette, Maiko Salais A, PA-C 08/27/24 7668    Jacolyn Pae, MD 08/29/24 1322

## 2025-02-17 ENCOUNTER — Ambulatory Visit: Admitting: Urology

## 2025-02-18 ENCOUNTER — Ambulatory Visit: Admitting: Urology
